# Patient Record
Sex: Male | Born: 1945 | Race: White | Hispanic: No | Marital: Married | Smoking: Current every day smoker
Health system: Southern US, Community
[De-identification: ages and names within clinical notes are randomized; demographics above are authoritative.]

## PROBLEM LIST (undated history)

## (undated) DIAGNOSIS — G20 Parkinson's disease: Secondary | ICD-10-CM

## (undated) DIAGNOSIS — F419 Anxiety disorder, unspecified: Secondary | ICD-10-CM

## (undated) DIAGNOSIS — E119 Type 2 diabetes mellitus without complications: Secondary | ICD-10-CM

## (undated) DIAGNOSIS — J189 Pneumonia, unspecified organism: Secondary | ICD-10-CM

## (undated) DIAGNOSIS — N189 Chronic kidney disease, unspecified: Secondary | ICD-10-CM

## (undated) DIAGNOSIS — I10 Essential (primary) hypertension: Secondary | ICD-10-CM

## (undated) DIAGNOSIS — M109 Gout, unspecified: Secondary | ICD-10-CM

## (undated) DIAGNOSIS — E785 Hyperlipidemia, unspecified: Secondary | ICD-10-CM

## (undated) DIAGNOSIS — G20C Parkinsonism, unspecified: Secondary | ICD-10-CM

## (undated) DIAGNOSIS — J449 Chronic obstructive pulmonary disease, unspecified: Secondary | ICD-10-CM

## (undated) HISTORY — DX: Essential (primary) hypertension: I10

## (undated) HISTORY — PX: KNEE ARTHROSCOPY: SUR90

## (undated) HISTORY — DX: Parkinsonism, unspecified: G20.C

## (undated) HISTORY — DX: Hyperlipidemia, unspecified: E78.5

## (undated) HISTORY — PX: ELBOW ARTHROSCOPY: SUR87

## (undated) HISTORY — PX: APPENDECTOMY: SHX54

## (undated) HISTORY — DX: Gout, unspecified: M10.9

## (undated) HISTORY — DX: Anxiety disorder, unspecified: F41.9

## (undated) HISTORY — DX: Chronic kidney disease, unspecified: N18.9

## (undated) HISTORY — DX: Parkinson's disease: G20

## (undated) HISTORY — DX: Chronic obstructive pulmonary disease, unspecified: J44.9

---

## 1997-10-02 ENCOUNTER — Ambulatory Visit (HOSPITAL_BASED_OUTPATIENT_CLINIC_OR_DEPARTMENT_OTHER): Admission: RE | Admit: 1997-10-02 | Discharge: 1997-10-02 | Payer: Self-pay | Admitting: Orthopedic Surgery

## 2006-06-08 ENCOUNTER — Encounter (INDEPENDENT_AMBULATORY_CARE_PROVIDER_SITE_OTHER): Payer: Self-pay | Admitting: Specialist

## 2006-06-08 ENCOUNTER — Ambulatory Visit (HOSPITAL_COMMUNITY): Admission: RE | Admit: 2006-06-08 | Discharge: 2006-06-09 | Payer: Self-pay | Admitting: General Surgery

## 2007-08-10 ENCOUNTER — Ambulatory Visit (HOSPITAL_COMMUNITY): Admission: RE | Admit: 2007-08-10 | Discharge: 2007-08-11 | Payer: Self-pay | Admitting: Neurosurgery

## 2010-08-20 NOTE — Op Note (Signed)
NAME:  Mitchell Hunter, Mitchell Hunter NO.:  [id number]   MEDICAL RECORD NO.:  [id number]          PATIENT TYPE:  AMB   LOCATION:  SDS                          FACILITY:  [facility]   PHYSICIAN:  Otilio Connors, M.D.  DATE OF BIRTH:  [birth year]   DATE OF PROCEDURE:  08/10/2007  DATE OF DISCHARGE:                               OPERATIVE REPORT   PREOPERATIVE DIAGNOSIS:  Herniated nucleus pulposus, left L4-5.   POSTOPERATIVE DIAGNOSIS:  Herniated nucleus pulposus, left L4-5.   PROCEDURES:  1. Left L4-5 semi-hemilaminectomy and discectomy.  2. Microdissection with microscope.   SURGEON:  Hazle Coca, M.D.   ANESTHESIA:  General endotracheal tube anesthesia.   BLOOD LOSS:  Minimal.   BLOOD GIVEN:  None.   DRAINS:  None.   COMPLICATIONS:  None.   REASON FOR PROCEDURE:  The patient is a 65 year old gentleman with back  and left leg pain, numbness, and some weakness in the left EHL.  MRI was  done showing disk herniation extruded down to the L5 root on the left  side.  The patient was brought in for decompression.   PROCEDURE IN DETAIL:  The patient was brought to the operating room and  general anesthesia was induced.  The patient was placed in a prone  position on Wilson frame with all pressure points padded.  The patient  was prepped and draped in a sterile fashion.  Stab incision was injected  with 10 mL of 1% lidocaine with epinephrine.  The needle was placed in  interspace.  X-rays were obtained showing the needle pointing at the L4-  5 interspace.  An incision was then made centered where the needle was.  The incision was taken down to the fascia and hemostasis was obtained  with Bovie cauterization.  The fascia was incised to the left side of  the spinous process and subperiosteal dissection was done over the  spinous process of the lamina out to the facet.  Self-retaining  retractor was placed and a marker was placed in the interspace and an x-  ray was obtained  confirming our positioning at L4-5.  At this point,  semi-hemilaminectomy was started with high-speed drill.  Microscope was  brought in for microdissection.  At this point, Kerrison punches were  used to complete the semi-hemilaminectomy, medial facetectomy.  A  foraminotomy was done over the L5 root.  Exploring the epidural space,  found a subligamentous disk herniation inferior to the disk space up  under the L5 root.  __________  disk fragments and removed it using  hooks and pituitary rongeurs.  After that, we were able to place a  __________ retractor and __________  medially.  We explored the disk  space.  There was a broad-based disk bulge but no significant nerve root  compression and no tear was easily noticed.  Again, I explored the  __________ nerve root and  even going into the axilla from the nerve  root and could not find any further fragments.  Nerve roots were  decompressed.  The above was also well decompressed.  Hemostasis was  obtained with  Gelfoam and thrombin.  This was irrigated with antibiotic  solution.  I placed some Depo-Medrol in the epidural space, removed the  retractors, closed the fascia with 0 Vicryl interrupted sutures,  subcutaneous tissue closed with 2-0 Vicryl interrupted sutures, and the  skin was closed with benzoin and Steri-Strips.  A dressing was placed.  The patient was placed back in the supine position, awoken from  anesthesia, and transferred to recovery room in stable condition.           ______________________________  Otilio Connors, M.D.     JRH/MEDQ  D:  08/10/2007  T:  08/11/2007  Job:  [id number]

## 2010-08-23 NOTE — Op Note (Signed)
NAME:  Mitchell Hunter, Mitchell Hunter NO.:  [id number]   MEDICAL RECORD NO.:  [id number]          PATIENT TYPE:  AMB   LOCATION:  DAY                          FACILITY:  [facility]   PHYSICIAN:  Odis Hollingshead, M.D.DATE OF BIRTH:  05-13-1945   DATE OF PROCEDURE:  06/08/2006  DATE OF DISCHARGE:                               OPERATIVE REPORT   PREOPERATIVE DIAGNOSIS:  Chronic appendicitis.   POSTOPERATIVE DIAGNOSIS:  Chronic appendicitis.   PROCEDURE:  Laparoscopic appendectomy with lysis of adhesions.   ANESTHESIA:  General.   INDICATIONS:  This is a 7-year male who has been known by me.  He had a  right lower quadrant large inflammatory process in the past that was  treated with adequate antibiotics.  It was felt that this was an  appendicitis.  We had discussed doing an interval appendectomy or  potentially just observing him.  He was observed and then returned early  this year with recurrent symptoms of chronic appendicitis which was  verified by CT scan and now presents for laparoscopic appendectomy.  We  have discussed the procedure and the risks preoperatively.   TECHNIQUE:  He was seen in the holding area and brought to the operating  room, placed supine on the operating table, and a general anesthetic was  administered.  A Foley catheter was placed in the bladder.  The hair on  the abdominal wall was clipped and the area sterilely prepped and  draped.  Dilute Marcaine solution was infiltrated in the subumbilical  region.  A small subumbilical incision was made through the skin,  subcutaneous tissue, fascia and peritoneum, entering the peritoneal  cavity under direct vision.  A pursestring suture of 0-Vicryl was placed  around the fascial edges.  A Hassan trocar was introduced into the  peritoneal cavity and pneumoperitoneum was created by insufflation of  CO2 gas.   Next a laparoscope was introduced.  There were adhesions between the  small intestine and anterior  abdominal wall in the right lower quadrant  region.  A 5 mm trocar was placed in the left lower quadrant and using  cold scissors, these adhesions were lysed, dropping the small bowel away  from the abdominal wall.  I then was able to visualize the appendix and  it tracked toward the right pelvis.  Using careful blunt dissection and  hydrodissection, I freed the appendix up from the pelvic sidewall.  I  then placed a 5 mm trocar in the right upper quadrant.  The mesoappendix  was grasped and the appendix retracted upward.  I then divided the  mesoappendix toward the base of the appendix using the harmonic scalpel.   At this time, an Endo-GIA stapler was introduced into the peritoneal  cavity and the appendix was amputated off the cecum.  It was obviously  chronically inflamed.  It was placed in an Endopouch bag and removed  through the subumbilical port.   The subumbilical trocar was replaced.  I copiously irrigated out the  right lower quadrant of the abdominal cavity and evacuated fluid.  There  was very minimal bleeding from the  staple line, and I placed Surgicel  and applied pressure and the bleeding stopped.  I then evacuated more of  the fluid and inspected the area once again.  It was hemostatic.  No  evidence of leak.   The subumbilical port was removed and a subumbilical fascial defect was  then closed under laparoscopic vision by tightening up and tying down  the pursestring suture.  The remaining trocars were removed, and the  pneumoperitoneum was released.  The skin incisions were closed with 4-0  Monocryl subcuticular stitches.  Steri-Strips and sterile dressings were  applied.   He tolerated the procedure without any apparent complications and was  taken to the recovery room in satisfactory condition.      Odis Hollingshead, M.D.  Electronically Signed     TJR/MEDQ  D:  06/08/2006  T:  06/08/2006  Job:  [id number]   cc:   Carolann Littler, M.D.  Fax: [phone]

## 2013-04-21 ENCOUNTER — Other Ambulatory Visit: Payer: Self-pay | Admitting: Family Medicine

## 2013-04-21 DIAGNOSIS — R531 Weakness: Secondary | ICD-10-CM

## 2013-04-21 DIAGNOSIS — M545 Low back pain, unspecified: Secondary | ICD-10-CM

## 2013-04-23 ENCOUNTER — Ambulatory Visit
Admission: RE | Admit: 2013-04-23 | Discharge: 2013-04-23 | Disposition: A | Discharge status: Home / Self Care | Payer: Medicare Other | Source: Ambulatory Visit | Attending: Family Medicine | Admitting: Family Medicine

## 2013-04-23 DIAGNOSIS — M545 Low back pain, unspecified: Secondary | ICD-10-CM

## 2013-04-23 DIAGNOSIS — R531 Weakness: Secondary | ICD-10-CM

## 2013-05-04 ENCOUNTER — Encounter (HOSPITAL_COMMUNITY): Payer: Self-pay | Admitting: Emergency Medicine

## 2013-05-04 ENCOUNTER — Encounter (HOSPITAL_COMMUNITY)
Admission: EM | Disposition: A | Discharge status: Home / Self Care | Payer: Self-pay | Source: Home / Self Care | Attending: Neurosurgery

## 2013-05-04 ENCOUNTER — Inpatient Hospital Stay: Admit: 2013-05-04 | Payer: Self-pay | Admitting: Neurosurgery

## 2013-05-04 ENCOUNTER — Inpatient Hospital Stay (HOSPITAL_COMMUNITY): Payer: Medicare Other | Admitting: Anesthesiology

## 2013-05-04 ENCOUNTER — Inpatient Hospital Stay (HOSPITAL_COMMUNITY): Payer: Medicare Other

## 2013-05-04 ENCOUNTER — Inpatient Hospital Stay (HOSPITAL_COMMUNITY)
Admission: EM | Admit: 2013-05-04 | Discharge: 2013-05-05 | DRG: 520 | Disposition: A | Discharge status: Home / Self Care | Payer: Medicare Other | Attending: Neurosurgery | Admitting: Neurosurgery

## 2013-05-04 ENCOUNTER — Encounter (HOSPITAL_COMMUNITY): Payer: Medicare Other | Admitting: Anesthesiology

## 2013-05-04 DIAGNOSIS — IMO0002 Reserved for concepts with insufficient information to code with codable children: Secondary | ICD-10-CM | POA: Diagnosis present

## 2013-05-04 DIAGNOSIS — M51379 Other intervertebral disc degeneration, lumbosacral region without mention of lumbar back pain or lower extremity pain: Secondary | ICD-10-CM | POA: Diagnosis present

## 2013-05-04 DIAGNOSIS — M5126 Other intervertebral disc displacement, lumbar region: Principal | ICD-10-CM | POA: Diagnosis present

## 2013-05-04 DIAGNOSIS — M47817 Spondylosis without myelopathy or radiculopathy, lumbosacral region: Secondary | ICD-10-CM | POA: Diagnosis present

## 2013-05-04 DIAGNOSIS — Z7982 Long term (current) use of aspirin: Secondary | ICD-10-CM

## 2013-05-04 DIAGNOSIS — I10 Essential (primary) hypertension: Secondary | ICD-10-CM | POA: Diagnosis present

## 2013-05-04 DIAGNOSIS — F17200 Nicotine dependence, unspecified, uncomplicated: Secondary | ICD-10-CM | POA: Diagnosis present

## 2013-05-04 DIAGNOSIS — E119 Type 2 diabetes mellitus without complications: Secondary | ICD-10-CM | POA: Diagnosis present

## 2013-05-04 DIAGNOSIS — M5137 Other intervertebral disc degeneration, lumbosacral region: Secondary | ICD-10-CM | POA: Diagnosis present

## 2013-05-04 DIAGNOSIS — Z79899 Other long term (current) drug therapy: Secondary | ICD-10-CM

## 2013-05-04 HISTORY — PX: LUMBAR LAMINECTOMY/DECOMPRESSION MICRODISCECTOMY: SHX5026

## 2013-05-04 HISTORY — DX: Type 2 diabetes mellitus without complications: E11.9

## 2013-05-04 LAB — GLUCOSE, CAPILLARY
Glucose-Capillary: 137 mg/dL — ABNORMAL HIGH (ref 70–99)
Glucose-Capillary: 181 mg/dL — ABNORMAL HIGH (ref 70–99)

## 2013-05-04 SURGERY — LUMBAR LAMINECTOMY/DECOMPRESSION MICRODISCECTOMY 1 LEVEL
Anesthesia: General | Site: Back

## 2013-05-04 MED ORDER — BENAZEPRIL HCL 20 MG PO TABS
20.0000 mg | ORAL_TABLET | Freq: Every day | ORAL | Status: DC
  Administered 2013-05-04 – 2013-05-05 (×2): 20 mg via ORAL
  Filled 2013-05-04 (×2): qty 1

## 2013-05-04 MED ORDER — MIDAZOLAM HCL 2 MG/2ML IJ SOLN
INTRAMUSCULAR | Status: AC
  Filled 2013-05-04: qty 2

## 2013-05-04 MED ORDER — HYDROMORPHONE HCL PF 1 MG/ML IJ SOLN
INTRAMUSCULAR | Status: AC
  Administered 2013-05-04: 0.5 mg via INTRAVENOUS
  Filled 2013-05-04: qty 1

## 2013-05-04 MED ORDER — HYDROCHLOROTHIAZIDE 12.5 MG PO CAPS
12.5000 mg | ORAL_CAPSULE | Freq: Every day | ORAL | Status: DC
  Administered 2013-05-04 – 2013-05-05 (×2): 12.5 mg via ORAL
  Filled 2013-05-04 (×2): qty 1

## 2013-05-04 MED ORDER — BISACODYL 10 MG RE SUPP: 10.0000 mg | Freq: Every day | RECTAL | Status: DC | PRN

## 2013-05-04 MED ORDER — ACETAMINOPHEN 325 MG PO TABS: 650.0000 mg | ORAL_TABLET | ORAL | Status: DC | PRN

## 2013-05-04 MED ORDER — INSULIN ASPART 100 UNIT/ML ~~LOC~~ SOLN: 0.0000 [IU] | Freq: Every day | SUBCUTANEOUS | Status: DC

## 2013-05-04 MED ORDER — METFORMIN HCL 500 MG PO TABS
1000.0000 mg | ORAL_TABLET | Freq: Two times a day (BID) | ORAL | Status: DC
  Administered 2013-05-04 – 2013-05-05 (×2): 1000 mg via ORAL
  Filled 2013-05-04 (×3): qty 2

## 2013-05-04 MED ORDER — DIAZEPAM 5 MG/ML IJ SOLN
2.5000 mg | Freq: Once | INTRAMUSCULAR | Status: AC
  Administered 2013-05-04: 2.5 mg via INTRAVENOUS

## 2013-05-04 MED ORDER — 0.9 % SODIUM CHLORIDE (POUR BTL) OPTIME
TOPICAL | Status: DC | PRN
  Administered 2013-05-04: 1000 mL

## 2013-05-04 MED ORDER — OXYCODONE HCL 5 MG/5ML PO SOLN: 5.0000 mg | Freq: Once | ORAL | Status: AC | PRN

## 2013-05-04 MED ORDER — CYCLOBENZAPRINE HCL 10 MG PO TABS: 10.0000 mg | ORAL_TABLET | Freq: Three times a day (TID) | ORAL | Status: DC | PRN

## 2013-05-04 MED ORDER — SODIUM CHLORIDE 0.9 % IJ SOLN: 3.0000 mL | INTRAMUSCULAR | Status: DC | PRN

## 2013-05-04 MED ORDER — INSULIN ASPART 100 UNIT/ML ~~LOC~~ SOLN
0.0000 [IU] | Freq: Three times a day (TID) | SUBCUTANEOUS | Status: DC
  Administered 2013-05-04: 2 [IU] via SUBCUTANEOUS

## 2013-05-04 MED ORDER — ONDANSETRON HCL 4 MG/2ML IJ SOLN
INTRAMUSCULAR | Status: AC
  Filled 2013-05-04: qty 2

## 2013-05-04 MED ORDER — HEMOSTATIC AGENTS (NO CHARGE) OPTIME
TOPICAL | Status: DC | PRN
  Administered 2013-05-04: 1 via TOPICAL

## 2013-05-04 MED ORDER — ALLOPURINOL 100 MG PO TABS
100.0000 mg | ORAL_TABLET | Freq: Two times a day (BID) | ORAL | Status: DC
  Administered 2013-05-04 – 2013-05-05 (×2): 100 mg via ORAL
  Filled 2013-05-04 (×3): qty 1

## 2013-05-04 MED ORDER — FENTANYL CITRATE 0.05 MG/ML IJ SOLN
INTRAMUSCULAR | Status: DC | PRN
  Administered 2013-05-04: 100 ug via INTRAVENOUS

## 2013-05-04 MED ORDER — CEFAZOLIN SODIUM-DEXTROSE 2-3 GM-% IV SOLR
INTRAVENOUS | Status: AC
  Administered 2013-05-04: 2 g via INTRAVENOUS
  Filled 2013-05-04: qty 50

## 2013-05-04 MED ORDER — ATENOLOL 100 MG PO TABS
100.0000 mg | ORAL_TABLET | Freq: Every day | ORAL | Status: DC
  Administered 2013-05-05: 100 mg via ORAL
  Filled 2013-05-04: qty 1

## 2013-05-04 MED ORDER — SODIUM CHLORIDE 0.9 % IJ SOLN
3.0000 mL | Freq: Two times a day (BID) | INTRAMUSCULAR | Status: DC
  Administered 2013-05-04 – 2013-05-05 (×2): 3 mL via INTRAVENOUS

## 2013-05-04 MED ORDER — ALUM & MAG HYDROXIDE-SIMETH 200-200-20 MG/5ML PO SUSP: 30.0000 mL | Freq: Four times a day (QID) | ORAL | Status: DC | PRN

## 2013-05-04 MED ORDER — KETOROLAC TROMETHAMINE 30 MG/ML IJ SOLN
30.0000 mg | Freq: Four times a day (QID) | INTRAMUSCULAR | Status: DC
Start: 2013-05-04 — End: 2013-05-05
  Administered 2013-05-04 – 2013-05-05 (×3): 30 mg via INTRAVENOUS
  Filled 2013-05-04 (×6): qty 1

## 2013-05-04 MED ORDER — MIDAZOLAM HCL 5 MG/5ML IJ SOLN
INTRAMUSCULAR | Status: DC | PRN
  Administered 2013-05-04: 2 mg via INTRAVENOUS

## 2013-05-04 MED ORDER — HYDROXYZINE HCL 25 MG PO TABS: 50.0000 mg | ORAL_TABLET | ORAL | Status: DC | PRN

## 2013-05-04 MED ORDER — THROMBIN 5000 UNITS EX SOLR
CUTANEOUS | Status: DC | PRN
  Administered 2013-05-04 (×2): 5000 [IU] via TOPICAL

## 2013-05-04 MED ORDER — PROMETHAZINE HCL 25 MG/ML IJ SOLN: 6.2500 mg | INTRAMUSCULAR | Status: DC | PRN

## 2013-05-04 MED ORDER — FENTANYL CITRATE 0.05 MG/ML IJ SOLN
INTRAMUSCULAR | Status: AC
Start: 2013-05-04 — End: 2013-05-05
  Filled 2013-05-04: qty 2

## 2013-05-04 MED ORDER — PROPOFOL 10 MG/ML IV BOLUS
INTRAVENOUS | Status: DC | PRN
  Administered 2013-05-04: 160 mg via INTRAVENOUS

## 2013-05-04 MED ORDER — BUPIVACAINE HCL (PF) 0.5 % IJ SOLN
INTRAMUSCULAR | Status: DC | PRN
  Administered 2013-05-04: 10 mL

## 2013-05-04 MED ORDER — MENTHOL 3 MG MT LOZG
1.0000 | LOZENGE | OROMUCOSAL | Status: DC | PRN
Start: 2013-05-04 — End: 2013-05-05

## 2013-05-04 MED ORDER — DIAZEPAM 5 MG/ML IJ SOLN
INTRAMUSCULAR | Status: AC
  Administered 2013-05-04: 2.5 mg via INTRAVENOUS
  Filled 2013-05-04: qty 2

## 2013-05-04 MED ORDER — OXYCODONE HCL 5 MG PO TABS
5.0000 mg | ORAL_TABLET | Freq: Once | ORAL | Status: AC | PRN
  Administered 2013-05-04: 5 mg via ORAL

## 2013-05-04 MED ORDER — PROPOFOL 10 MG/ML IV BOLUS
INTRAVENOUS | Status: AC
  Filled 2013-05-04: qty 20

## 2013-05-04 MED ORDER — OXYCODONE HCL 5 MG PO TABS
ORAL_TABLET | ORAL | Status: AC
  Administered 2013-05-04: 5 mg via ORAL
  Filled 2013-05-04: qty 2

## 2013-05-04 MED ORDER — ONDANSETRON HCL 4 MG/2ML IJ SOLN
INTRAMUSCULAR | Status: DC | PRN
  Administered 2013-05-04: 4 mg via INTRAVENOUS

## 2013-05-04 MED ORDER — FENTANYL CITRATE 0.05 MG/ML IJ SOLN
INTRAMUSCULAR | Status: AC
  Filled 2013-05-04: qty 5

## 2013-05-04 MED ORDER — SODIUM CHLORIDE 0.9 % IV SOLN: 250.0000 mL | INTRAVENOUS | Status: DC

## 2013-05-04 MED ORDER — EPHEDRINE SULFATE 50 MG/ML IJ SOLN
INTRAMUSCULAR | Status: AC
  Filled 2013-05-04: qty 1

## 2013-05-04 MED ORDER — OXYCODONE-ACETAMINOPHEN 5-325 MG PO TABS
1.0000 | ORAL_TABLET | ORAL | Status: DC | PRN
  Administered 2013-05-04: 2 via ORAL
  Filled 2013-05-04: qty 2

## 2013-05-04 MED ORDER — ACETAMINOPHEN 650 MG RE SUPP: 650.0000 mg | RECTAL | Status: DC | PRN

## 2013-05-04 MED ORDER — KETOROLAC TROMETHAMINE 30 MG/ML IJ SOLN
INTRAMUSCULAR | Status: AC
  Filled 2013-05-04: qty 1

## 2013-05-04 MED ORDER — NEOSTIGMINE METHYLSULFATE 1 MG/ML IJ SOLN
INTRAMUSCULAR | Status: AC
  Filled 2013-05-04: qty 10

## 2013-05-04 MED ORDER — BENAZEPRIL-HYDROCHLOROTHIAZIDE 20-12.5 MG PO TABS
1.0000 | ORAL_TABLET | Freq: Every day | ORAL | Status: DC
Start: 2013-05-04 — End: 2013-05-04

## 2013-05-04 MED ORDER — GABAPENTIN 600 MG PO TABS
600.0000 mg | ORAL_TABLET | Freq: Two times a day (BID) | ORAL | Status: DC
  Administered 2013-05-04 – 2013-05-05 (×2): 600 mg via ORAL
  Filled 2013-05-04 (×4): qty 1

## 2013-05-04 MED ORDER — GLYCOPYRROLATE 0.2 MG/ML IJ SOLN
INTRAMUSCULAR | Status: DC | PRN
  Administered 2013-05-04: 0.2 mg via INTRAVENOUS
  Administered 2013-05-04: 0.6 mg via INTRAVENOUS

## 2013-05-04 MED ORDER — LIDOCAINE HCL (CARDIAC) 20 MG/ML IV SOLN
INTRAVENOUS | Status: DC | PRN
  Administered 2013-05-04: 50 mg via INTRAVENOUS

## 2013-05-04 MED ORDER — HYDROMORPHONE HCL PF 1 MG/ML IJ SOLN
0.2500 mg | INTRAMUSCULAR | Status: DC | PRN
  Administered 2013-05-04 (×5): 0.5 mg via INTRAVENOUS

## 2013-05-04 MED ORDER — GLYCOPYRROLATE 0.2 MG/ML IJ SOLN
INTRAMUSCULAR | Status: AC
  Filled 2013-05-04: qty 1

## 2013-05-04 MED ORDER — SODIUM CHLORIDE 0.9 % IV SOLN
INTRAVENOUS | Status: DC
  Administered 2013-05-04: 22:00:00 via INTRAVENOUS

## 2013-05-04 MED ORDER — LACTATED RINGERS IV SOLN
INTRAVENOUS | Status: DC | PRN
  Administered 2013-05-04 (×2): via INTRAVENOUS

## 2013-05-04 MED ORDER — GLYCOPYRROLATE 0.2 MG/ML IJ SOLN
INTRAMUSCULAR | Status: AC
  Filled 2013-05-04: qty 3

## 2013-05-04 MED ORDER — HYDROCODONE-ACETAMINOPHEN 5-325 MG PO TABS: 1.0000 | ORAL_TABLET | ORAL | Status: DC | PRN

## 2013-05-04 MED ORDER — HYDROMORPHONE HCL PF 1 MG/ML IJ SOLN
INTRAMUSCULAR | Status: AC
  Filled 2013-05-04: qty 1

## 2013-05-04 MED ORDER — PHENOL 1.4 % MT LIQD: 1.0000 | OROMUCOSAL | Status: DC | PRN

## 2013-05-04 MED ORDER — KETOROLAC TROMETHAMINE 30 MG/ML IJ SOLN
30.0000 mg | Freq: Once | INTRAMUSCULAR | Status: AC
  Administered 2013-05-04: 30 mg via INTRAVENOUS

## 2013-05-04 MED ORDER — FENTANYL CITRATE 0.05 MG/ML IJ SOLN
INTRAMUSCULAR | Status: DC | PRN
  Administered 2013-05-04: 50 ug via INTRAVENOUS
  Administered 2013-05-04 (×3): 100 ug via INTRAVENOUS

## 2013-05-04 MED ORDER — MAGNESIUM HYDROXIDE 400 MG/5ML PO SUSP: 30.0000 mL | Freq: Every day | ORAL | Status: DC | PRN

## 2013-05-04 MED ORDER — ONDANSETRON HCL 4 MG/2ML IJ SOLN: 4.0000 mg | Freq: Four times a day (QID) | INTRAMUSCULAR | Status: DC | PRN

## 2013-05-04 MED ORDER — METHYLPREDNISOLONE ACETATE 80 MG/ML IJ SUSP
INTRAMUSCULAR | Status: DC | PRN
  Administered 2013-05-04: 80 mg

## 2013-05-04 MED ORDER — SODIUM CHLORIDE 0.9 % IR SOLN
Status: DC | PRN
  Administered 2013-05-04: 15:00:00

## 2013-05-04 MED ORDER — MORPHINE SULFATE 4 MG/ML IJ SOLN: 4.0000 mg | INTRAMUSCULAR | Status: DC | PRN

## 2013-05-04 MED ORDER — NEOSTIGMINE METHYLSULFATE 1 MG/ML IJ SOLN
INTRAMUSCULAR | Status: DC | PRN
  Administered 2013-05-04: 4 mg via INTRAVENOUS

## 2013-05-04 MED ORDER — SODIUM CHLORIDE 0.9 % IJ SOLN
INTRAMUSCULAR | Status: AC
  Filled 2013-05-04: qty 10

## 2013-05-04 MED ORDER — LIDOCAINE-EPINEPHRINE 1 %-1:100000 IJ SOLN
INTRAMUSCULAR | Status: DC | PRN
  Administered 2013-05-04: 10 mL

## 2013-05-04 MED ORDER — ROCURONIUM BROMIDE 100 MG/10ML IV SOLN
INTRAVENOUS | Status: DC | PRN
  Administered 2013-05-04: 50 mg via INTRAVENOUS

## 2013-05-04 SURGICAL SUPPLY — 67 items
ADH SKN CLS APL DERMABOND .7 (GAUZE/BANDAGES/DRESSINGS)
APL SKNCLS STERI-STRIP NONHPOA (GAUZE/BANDAGES/DRESSINGS)
BAG DECANTER FOR FLEXI CONT (MISCELLANEOUS) ×2 IMPLANT
BENZOIN TINCTURE PRP APPL 2/3 (GAUZE/BANDAGES/DRESSINGS) IMPLANT
BLADE SURG ROTATE 9660 (MISCELLANEOUS) ×1 IMPLANT
BRUSH SCRUB EZ PLAIN DRY (MISCELLANEOUS) ×2 IMPLANT
BUR ACORN 6.0 ACORN (BURR) IMPLANT
BUR ACRON 5.0MM COATED (BURR) ×1 IMPLANT
BUR MATCHSTICK NEURO 3.0 LAGG (BURR) ×2 IMPLANT
CANISTER SUCT 3000ML (MISCELLANEOUS) ×2 IMPLANT
CONT SPEC 4OZ CLIKSEAL STRL BL (MISCELLANEOUS) IMPLANT
DERMABOND ADVANCED (GAUZE/BANDAGES/DRESSINGS)
DERMABOND ADVANCED .7 DNX12 (GAUZE/BANDAGES/DRESSINGS) IMPLANT
DRAPE LAPAROTOMY 100X72X124 (DRAPES) ×2 IMPLANT
DRAPE MICROSCOPE LEICA (MISCELLANEOUS) ×2 IMPLANT
DRAPE POUCH INSTRU U-SHP 10X18 (DRAPES) ×2 IMPLANT
DRSG EMULSION OIL 3X3 NADH (GAUZE/BANDAGES/DRESSINGS) IMPLANT
ELECT REM PT RETURN 9FT ADLT (ELECTROSURGICAL) ×2
ELECTRODE REM PT RTRN 9FT ADLT (ELECTROSURGICAL) ×1 IMPLANT
GAUZE SPONGE 4X4 16PLY XRAY LF (GAUZE/BANDAGES/DRESSINGS) IMPLANT
GLOVE BIO SURGEON STRL SZ8.5 (GLOVE) ×1 IMPLANT
GLOVE BIOGEL PI IND STRL 7.5 (GLOVE) IMPLANT
GLOVE BIOGEL PI IND STRL 8 (GLOVE) ×1 IMPLANT
GLOVE BIOGEL PI INDICATOR 7.5 (GLOVE) ×4
GLOVE BIOGEL PI INDICATOR 8 (GLOVE) ×1
GLOVE ECLIPSE 7.5 STRL STRAW (GLOVE) ×4 IMPLANT
GLOVE EXAM NITRILE LRG STRL (GLOVE) IMPLANT
GLOVE EXAM NITRILE MD LF STRL (GLOVE) IMPLANT
GLOVE EXAM NITRILE XL STR (GLOVE) IMPLANT
GLOVE EXAM NITRILE XS STR PU (GLOVE) IMPLANT
GLOVE SS BIOGEL STRL SZ 8 (GLOVE) IMPLANT
GLOVE SUPERSENSE BIOGEL SZ 8 (GLOVE) ×1
GLOVE SURG SS PI 7.0 STRL IVOR (GLOVE) ×3 IMPLANT
GOWN BRE IMP SLV AUR LG STRL (GOWN DISPOSABLE) ×1 IMPLANT
GOWN BRE IMP SLV AUR XL STRL (GOWN DISPOSABLE) IMPLANT
GOWN STRL REIN 2XL LVL4 (GOWN DISPOSABLE) IMPLANT
GOWN STRL REUS W/ TWL LRG LVL3 (GOWN DISPOSABLE) IMPLANT
GOWN STRL REUS W/ TWL XL LVL3 (GOWN DISPOSABLE) IMPLANT
GOWN STRL REUS W/TWL LRG LVL3 (GOWN DISPOSABLE) ×4
GOWN STRL REUS W/TWL XL LVL3 (GOWN DISPOSABLE) ×6
KIT BASIN OR (CUSTOM PROCEDURE TRAY) ×2 IMPLANT
KIT ROOM TURNOVER OR (KITS) ×2 IMPLANT
NDL HYPO 18GX1.5 BLUNT FILL (NEEDLE) IMPLANT
NDL SPNL 18GX3.5 QUINCKE PK (NEEDLE) ×1 IMPLANT
NDL SPNL 22GX3.5 QUINCKE BK (NEEDLE) ×1 IMPLANT
NEEDLE HYPO 18GX1.5 BLUNT FILL (NEEDLE) ×2 IMPLANT
NEEDLE SPNL 18GX3.5 QUINCKE PK (NEEDLE) ×2 IMPLANT
NEEDLE SPNL 22GX3.5 QUINCKE BK (NEEDLE) ×2 IMPLANT
NS IRRIG 1000ML POUR BTL (IV SOLUTION) ×2 IMPLANT
PACK LAMINECTOMY NEURO (CUSTOM PROCEDURE TRAY) ×2 IMPLANT
PAD ARMBOARD 7.5X6 YLW CONV (MISCELLANEOUS) ×6 IMPLANT
PATTIES SURGICAL .5 X1 (DISPOSABLE) ×1 IMPLANT
RUBBERBAND STERILE (MISCELLANEOUS) ×4 IMPLANT
SPONGE GAUZE 4X4 12PLY (GAUZE/BANDAGES/DRESSINGS) IMPLANT
SPONGE LAP 4X18 X RAY DECT (DISPOSABLE) IMPLANT
SPONGE SURGIFOAM ABS GEL SZ50 (HEMOSTASIS) ×2 IMPLANT
STRIP CLOSURE SKIN 1/2X4 (GAUZE/BANDAGES/DRESSINGS) IMPLANT
SUT PROLENE 6 0 BV (SUTURE) IMPLANT
SUT VIC AB 1 CT1 18XBRD ANBCTR (SUTURE) ×1 IMPLANT
SUT VIC AB 1 CT1 8-18 (SUTURE) ×2
SUT VIC AB 2-0 CP2 18 (SUTURE) ×2 IMPLANT
SUT VIC AB 3-0 SH 8-18 (SUTURE) ×1 IMPLANT
SYR 20ML ECCENTRIC (SYRINGE) ×2 IMPLANT
SYR 5ML LL (SYRINGE) ×1 IMPLANT
TOWEL OR 17X24 6PK STRL BLUE (TOWEL DISPOSABLE) ×2 IMPLANT
TOWEL OR 17X26 10 PK STRL BLUE (TOWEL DISPOSABLE) ×2 IMPLANT
WATER STERILE IRR 1000ML POUR (IV SOLUTION) ×2 IMPLANT

## 2013-05-04 NOTE — ED Notes (Signed)
States is be admitted for back surgery today cannot get it for 14 days due to insurance cannot wait is in a lot of pain

## 2013-05-04 NOTE — Anesthesia Procedure Notes (Signed)
Procedure Name: Intubation Date/Time: 05/04/2013 2:22 PM Performed by: Maude Leriche D Pre-anesthesia Checklist: Patient identified, Emergency Drugs available, Suction available, Patient being monitored and Timeout performed Patient Re-evaluated:Patient Re-evaluated prior to inductionOxygen Delivery Method: Circle system utilized Preoxygenation: Pre-oxygenation with 100% oxygen Intubation Type: IV induction Ventilation: Mask ventilation without difficulty Laryngoscope Size: Miller and 2 Grade View: Grade I Tube type: Oral Tube size: 7.5 mm Number of attempts: 1 Airway Equipment and Method: Stylet Placement Confirmation: ETT inserted through vocal cords under direct vision,  positive ETCO2 and breath sounds checked- equal and bilateral Secured at: 22 cm Tube secured with: Tape Dental Injury: Teeth and Oropharynx as per pre-operative assessment

## 2013-05-04 NOTE — H&P (Addendum)
Subjective: Patient is a 68 y.o. male who is admitted for treatment of large right L3-4 lumbar disc herniation, with disabling right lumbar radiculopathy and significant weakness of the right iliopsoas. Patient has a history of a left L4-5 lumbar laminotomy and microdiscectomy in May of 2009. His current difficulties began to half weeks ago, initially with low back pain over several days pain extended down into the right lower extremity. It extends from the right side of the back into the lateral right buttock and hip, and into the anterolateral right thigh into the anterior right knee and leg. Patient fell at home, and feels that he has weakness in the right lower extremity.  The patient was treated with narcotic analgesics, prednisone, and Flexeril without improvement. MRI scan revealed a right L3-4 lumbar disc herniation with a fragment has migrated rostrally behind the body of L3 and laterally into the right L3-4 neural foramen, and to a lesser extent into the right L3-4 extra foraminal space.  Patient was evaluated in the office, and recommendation for surgery was made, however he felt that he could not wait until approval from his insurance carrier because of the disabling pain and significant weakness, and therefore presented to the [facility] emergency room for further evaluation.  Patient admitted now for a right L3-4 lumbar laminotomy discectomy, and possible right L3-4 extraforaminal microdiscectomy.   Patient Active Problem List   Diagnosis Date Noted  . HNP (herniated nucleus pulposus), lumbar 05/04/2013   Past Medical History  Diagnosis Date  . Hypertension   . Diabetes mellitus without complication     History reviewed. No pertinent past surgical history.   (Not in a hospital admission) No Known Allergies  History  Substance Use Topics  . Smoking status: Current Every Day Smoker  . Smokeless tobacco: Not on file  . Alcohol Use: Yes    Family History  Problem  Relation Age of Onset  . Lung disease Father      Review of Systems Pertinent items are noted in HPI.  Objective: Vital signs in last 24 hours: Temp:  [97.8 F (36.6 C)] 97.8 F (36.6 C) (01/28 1140) Pulse Rate:  [81] 81 (01/28 1140) Resp:  [20] 20 (01/28 1140) BP: (140)/(71) 140/71 mmHg (01/28 1140) SpO2:  [96 %] 96 % (01/28 1140) Weight:  [83.008 kg (183 lb)] 83.008 kg (183 lb) (01/28 1140)  EXAM: Patient is a well-developed well-nourished white male in discomfort, but no acute distress. He walks with a single-point cane favoring the right lower extremity due to pain and weakness. Lungs are clear to auscultation , the patient has symmetrical respiratory excursion, however his breath sounds are mildly distant. Heart has a regular rate and rhythm normal S1 and S2 no murmur.   Abdomen is soft nontender nondistended bowel sounds are present. Extremity examination shows mild clubbing, but no cyanosis or edema. Musculoskeletal examination shows no tenderness to palpation over the lumbar spinous process or paralumbar musculature. He is limited in flexion due to pain at 30-40, he's able to extend to 10. Straight leg raising is negative bilaterally Motor examination shows the right iliopsoas is 4 minus/5, the left iliopsoas is 5/5. The remainder the lower extremity strength is 5/5 including the quadriceps, dorsiflexor, EHL, and plantar flexor bilaterally. Sensation is intact to pinprick the distal lower extremities. Reflexes are one in the quadriceps, absent at the gastrocnemius, toes are downgoing bilaterally. His gait and stance both favor the right lower extremity, due to pain and weakness.  Assessment/Plan: Patient  with a disabling right lumbar radiculopathy, with weakness of the right iliopsoas, who had fallen at home.  MRI scan showed a large right L3-4 lumbar disc herniation. He is admitted for a right L3-4 lumbar laminotomy and microdiscectomy, and a possible right L3-4 extraforaminal  microdiscectomy.  I've discussed with the patient the nature of his condition, the nature the surgical procedure, the typical length of surgery, hospital stay, and overall recuperation. We discussed limitations postoperatively. I discussed risks of surgery including risks of infection, bleeding, possibly need for transfusion, the risk of nerve root dysfunction with pain, weakness, numbness, or paresthesias, or risk of dural tear and CSF leakage and possible need for further surgery, the risk of recurrent disc herniation and the possible need for further surgery, and the risk of anesthetic complications including myocardial infarction, stroke, pneumonia, and death. Understanding all this the patient does wish to proceed with surgery and is admitted for such.      Hosie Spangle, MD 05/04/2013 12:30 PM'

## 2013-05-04 NOTE — Progress Notes (Signed)
Pt arrived from OR on a stretcher.  Disoriented x4, turning side to side, not following commands but arousable.  In severe pain.

## 2013-05-04 NOTE — Anesthesia Preprocedure Evaluation (Addendum)
Anesthesia Evaluation  Patient identified by MRN, date of birth, ID band Patient awake    Reviewed: Allergy & Precautions, H&P , NPO status , Patient's Chart, lab work & pertinent test results  Airway Mallampati: I TM Distance: >3 FB Neck ROM: Full    Dental  (+) Caps and Dental Advisory Given   Pulmonary Current Smoker,  breath sounds clear to auscultation        Cardiovascular hypertension, Pt. on medications Rate:Normal     Neuro/Psych    GI/Hepatic   Endo/Other  diabetes, Oral Hypoglycemic Agents  Renal/GU      Musculoskeletal   Abdominal   Peds  Hematology   Anesthesia Other Findings   Reproductive/Obstetrics                         Anesthesia Physical Anesthesia Plan  ASA: III  Anesthesia Plan: General   Post-op Pain Management:    Induction: Intravenous  Airway Management Planned: Oral ETT  Additional Equipment:   Intra-op Plan:   Post-operative Plan: Extubation in OR  Informed Consent: I have reviewed the patients History and Physical, chart, labs and discussed the procedure including the risks, benefits and alternatives for the proposed anesthesia with the patient or authorized representative who has indicated his/her understanding and acceptance.   Dental advisory given  Plan Discussed with: CRNA, Surgeon and Anesthesiologist  Anesthesia Plan Comments:        Anesthesia Quick Evaluation

## 2013-05-04 NOTE — ED Provider Notes (Signed)
CSN: [id number]     Arrival date & time 05/04/13  1136 History   First MD Initiated Contact with Patient 05/04/13 1206     Chief Complaint  Patient presents with  . Back Pain   (Consider location/radiation/quality/duration/timing/severity/associated sxs/prior Treatment) HPI Mitchell Hunter is a 68 y.o. male who presents to ED with complaint of back pain. States hx of back pain and prior surgery. States pain worsened in the last 3 weeks with increased pain and weakness in right leg. State that he went to see his neurosurgeon, Dr. Rita Ohara, who recommended emergent surgery to prevent further neuro deficits. Pt was sent here for further treatment. He has been taking percocet at home for pain with no improvement. No recent injuries. No loss of bowels or bladder control.    Past Medical History  Diagnosis Date  . Hypertension   . Diabetes mellitus without complication    History reviewed. No pertinent past surgical history. Family History  Problem Relation Age of Onset  . Lung disease Father    History  Substance Use Topics  . Smoking status: Current Every Day Smoker  . Smokeless tobacco: Not on file  . Alcohol Use: Yes    Review of Systems  Constitutional: Negative for fever and chills.  Respiratory: Negative for cough, chest tightness and shortness of breath.   Cardiovascular: Negative for chest pain, palpitations and leg swelling.  Gastrointestinal: Negative for nausea, vomiting, abdominal pain, diarrhea and abdominal distention.  Genitourinary: Negative for dysuria, urgency, frequency, hematuria and flank pain.  Musculoskeletal: Positive for back pain. Negative for arthralgias, myalgias, neck pain and neck stiffness.  Skin: Negative for rash.  Allergic/Immunologic: Negative for immunocompromised state.  Neurological: Positive for weakness and numbness. Negative for dizziness, light-headedness and headaches.  All other systems reviewed and are negative.    Allergies  Review  of patient's allergies indicates no known allergies.  Home Medications   Current Outpatient Rx  Name  Route  Sig  Dispense  Refill  . allopurinol (ZYLOPRIM) 100 MG tablet   Oral   Take 100 mg by mouth 2 (two) times daily.         Marland Kitchen aspirin EC 81 MG tablet   Oral   Take 81 mg by mouth daily.         Marland Kitchen atenolol (TENORMIN) 100 MG tablet   Oral   Take 100 mg by mouth daily.         . benazepril-hydrochlorthiazide (LOTENSIN HCT) 20-12.5 MG per tablet   Oral   Take 1 tablet by mouth daily.         . Cetirizine HCl (ZYRTEC PO)   Oral   Take 10 mg by mouth daily.          . cyclobenzaprine (FLEXERIL) 5 MG tablet   Oral   Take 5 mg by mouth 3 (three) times daily as needed. For muscle spasms         . gabapentin (NEURONTIN) 600 MG tablet   Oral   Take 600 mg by mouth as directed.         . metFORMIN (GLUCOPHAGE) 1000 MG tablet   Oral   Take 1,000 mg by mouth 2 (two) times daily with a meal.         . Multiple Vitamins-Minerals (PRESERVISION AREDS 2 PO)   Oral   Take 1 capsule by mouth 2 (two) times daily.         . Omega-3 Fatty Acids (FISH OIL PO)  Oral   Take 1 capsule by mouth daily.         Marland Kitchen oxyCODONE-acetaminophen (PERCOCET/ROXICET) 5-325 MG per tablet   Oral   Take 1-2 tablets by mouth every 6 (six) hours as needed. For pain          BP 140/71  Pulse 81  Temp(Src) 97.8 F (36.6 C) (Oral)  Resp 20  Ht 6' (1.829 m)  Wt 183 lb (83.008 kg)  BMI 24.81 kg/m2  SpO2 96% Physical Exam  Nursing note and vitals reviewed. Constitutional: He appears well-developed and well-nourished. No distress.  HENT:  Head: Normocephalic.  Neck: Normal range of motion. Neck supple.  Cardiovascular: Normal rate, regular rhythm and normal heart sounds.   Pulmonary/Chest: Effort normal and breath sounds normal. No respiratory distress. He has no wheezes. He has no rales.  Abdominal: Soft. There is no tenderness.  Musculoskeletal:  No tenderness over lumbar  spinous processes. Pain with forward flexion. Limited ROM due to pain. No pain with straight let raise bilaterally.   Neurological: He is alert.  2+ and equal patellar reflexes bilaterally. Pt able to dorsiflex bilateral toes and feet with good strength against resistance. Mild weakness with knee extension and plantar flexion of the foot.  Equal sensation bilaterally over thighs and lower legs.   Skin: Skin is warm and dry.    ED Course  Procedures (including critical care time) Labs Review Labs Reviewed - No data to display Imaging Review No results found.  EKG Interpretation   None       MDM   1. Herniated lumbar intervertebral disc     Pt seen and examined. Dr. Rita Ohara here to take pt to surgery. Pt is aware.   Filed Vitals:   05/04/13 1140  BP: 140/71  Pulse: 81  Temp: 97.8 F (36.6 C)  Resp: 20       Mitchell Radel A Jessamyn Watterson, PA-C 05/04/13 1239

## 2013-05-04 NOTE — Transfer of Care (Signed)
Immediate Anesthesia Transfer of Care Note  Patient: Mitchell Hunter  Procedure(s) Performed: Procedure(s) with comments: Right Lumbar Three-Four laminectomy and microdiskectomy  (N/A) - Right Lumbar Three-Four laminectomy and microdiskectomy   Patient Location: PACU  Anesthesia Type:General  Level of Consciousness: sedated  Airway & Oxygen Therapy: Patient Spontanous Breathing and Patient connected to face mask oxygen  Post-op Assessment: Report given to PACU RN and Post -op Vital signs reviewed and stable  Post vital signs: Reviewed and stable  Complications: No apparent anesthesia complications

## 2013-05-04 NOTE — Progress Notes (Signed)
Filed Vitals:   05/04/13 1715 05/04/13 1723 05/04/13 1830 05/04/13 2002  BP: 134/71 148/75 132/81 130/80  Pulse: 85 76 84 71  Temp:  98.3 F (36.8 C) 98 F (36.7 C) 98.3 F (36.8 C)  TempSrc:    Oral  Resp: 17 17 18 18   Height:      Weight:      SpO2: 98% 97% 93% 93%    Patient resting comfortably in bed. Minimal incisional discomfort, good relief of right lumbar radiculopathy. Patient has not yet ambulated. Patient has not yet voided. Dressing applied by nursing staff because of a small amount of oozing at the bottom of the incision. No apparent further oozing.  Plan: Spoke with the nursing staff about monitoring voiding function. Encourage patient and nursing staff to have the patient ambulate in the halls this evening. We'll continue to progress through postoperative recovery.  Hosie Spangle, MD 05/04/2013, 8:13 PM

## 2013-05-04 NOTE — Anesthesia Postprocedure Evaluation (Signed)
  Anesthesia Post-op Note  Patient: Mitchell Hunter  Procedure(s) Performed: Procedure(s) with comments: Right Lumbar Three-Four laminectomy and microdiskectomy  (N/A) - Right Lumbar Three-Four laminectomy and microdiskectomy   Patient Location: PACU  Anesthesia Type:General  Level of Consciousness: awake, oriented, sedated and patient cooperative  Airway and Oxygen Therapy: Patient Spontanous Breathing  Post-op Pain: mild  Post-op Assessment: Post-op Vital signs reviewed, Patient's Cardiovascular Status Stable, Respiratory Function Stable, Patent Airway, No signs of Nausea or vomiting and Pain level controlled  Post-op Vital Signs: stable  Complications: No apparent anesthesia complications

## 2013-05-04 NOTE — Op Note (Signed)
05/04/2013  3:34 PM  PATIENT:  Mitchell Hunter  68 y.o. male  PRE-OPERATIVE DIAGNOSIS:  Right L3-4 Lumbar Herniated Nucleus Pulposus, lumbar spondylosis, lumbar degenerative disc disease, lumbar radiculopathy  POST-OPERATIVE DIAGNOSIS:  Right L3-4 Lumbar Herniated Nucleus Pulposus, lumbar spondylosis, lumbar degenerative disc disease, lumbar radiculopathy  PROCEDURE:  Procedure(s): Right Lumbar Three-Four laminotomy and microdiskectomy with microdissection, microsurgical technique, and the operating microscope  SURGEON:  Surgeon(s): Hosie Spangle, MD  ASSISTANTS: Newman Pies, M.D.  ANESTHESIA:   general  EBL:  Total I/O In: 1000 [I.V.:1000] Out: 50 [Blood:50]  BLOOD ADMINISTERED:none  COUNT: Correct per nursing staff  DICTATION: Patient was brought to the operating room and placed under general endotracheal anesthesia. Patient was turned to prone position the lumbar region was prepped with Betadine soap and solution and draped in a sterile fashion. The midline was infiltrated with local anesthetic with epinephrine. A localizing x-ray was taken and the L3-4 level was identified. Midline incision was made over the L3-4 level and was carried down through the subcutaneous tissue to the lumbar fascia. The lumbar fascia was incised on the right side and the paraspinal muscles were dissected from the spinous processes and lamina in a subperiosteal fashion. Another x-ray was taken and the L3-4 intralaminar space was identified. The operating microscope was draped and brought into the field provided additional magnification, illumination, and visualization. Laminotomy was performed using the high-speed drill and Kerrison punches. The ligamentum flavum was carefully resected. The underlying thecal sac and nerve root were identified. The disc herniation was identified and the thecal sac and nerve root gently retracted medially. There was a large free fragment extending rostrally behind the  body of L3, and extending up to the right L3 nerve root, and laterally into the right L3-4 neural foramen. Several large free fragments were removed, and then additional smaller fragments were removed. We identified the exiting right L3 nerve root and confirmed it was decompressed. We then carefully explored the epidural space and neural foramen, and removed all additional loose fragments of disc material. It was felt there is no advantage to enter the disc space, and that we achieved good decompression of the neural structures. Once the discectomy was completed and good decompression of the thecal sac and nerve had been achieved hemostasis was established with the use of bipolar cautery and Gelfoam with thrombin. The Gelfoam was removed and hemostasis confirmed. We then instilled 2 cc of fentanyl and 80 mg of Depo-Medrol into the epidural space. Deep fascia was closed with interrupted undyed 1 Vicryl sutures. Scarpa's fascia was closed with interrupted undyed 1 Vicryl sutures in the subcutaneous and subcuticular layer were closed with interrupted inverted 2-0 undyed Vicryl sutures. The skin edges were approximated with Dermabond. Following surgery the patient was turned back to a supine position to be reversed from the anesthetic extubated and transferred to the recovery room for further care.   PLAN OF CARE: Admit to inpatient   PATIENT DISPOSITION:  PACU - hemodynamically stable.   Delay start of Pharmacological VTE agent (>24hrs) due to surgical blood loss or risk of bleeding:  yes

## 2013-05-04 NOTE — OR Nursing (Signed)
Right Hand PIV was accidentally removed when patient transferred from the stretcher to the bed.  I attempted two new starts but both attempts infiltrated.  Caryl Pina RN was given report and she will assess pt for IV start.

## 2013-05-05 ENCOUNTER — Encounter (HOSPITAL_COMMUNITY): Payer: Self-pay | Admitting: Neurosurgery

## 2013-05-05 LAB — POCT I-STAT 4, (NA,K, GLUC, HGB,HCT)
Glucose, Bld: 141 mg/dL — ABNORMAL HIGH (ref 70–99)
HCT: 43 % (ref 39.0–52.0)
Hemoglobin: 14.6 g/dL (ref 13.0–17.0)
Potassium: 3.9 mEq/L (ref 3.7–5.3)
Sodium: 138 mEq/L (ref 137–147)

## 2013-05-05 LAB — GLUCOSE, CAPILLARY
Glucose-Capillary: 172 mg/dL — ABNORMAL HIGH (ref 70–99)
Glucose-Capillary: 220 mg/dL — ABNORMAL HIGH (ref 70–99)

## 2013-05-05 MED ORDER — OXYCODONE-ACETAMINOPHEN 5-325 MG PO TABS: 1.0000 | ORAL_TABLET | ORAL | Status: DC | PRN

## 2013-05-05 NOTE — Plan of Care (Signed)
Problem: Consults Goal: Diagnosis - Spinal Surgery Outcome: Completed/Met Date Met:  05/05/13 Lumbar Laminectomy (Complex)

## 2013-05-05 NOTE — Discharge Summary (Signed)
Physician Discharge Summary  Patient ID: Mitchell Hunter MRN: [id number] DOB/AGE: [birth year] 68 y.o.  Admit date: 05/04/2013 Discharge date: 05/05/2013  Admission Diagnoses:  Right L3-4 Lumbar Herniated Nucleus Pulposus, lumbar spondylosis, lumbar degenerative disc disease, lumbar radiculopathy  Discharge Diagnoses:  Right L3-4 Lumbar Herniated Nucleus Pulposus, lumbar spondylosis, lumbar degenerative disc disease, lumbar radiculopathy  Active Problems:   HNP (herniated nucleus pulposus), lumbar  Discharged Condition: good  Hospital Course: Patient was admitted, underwent a right L3-4 lumbar laminotomy and microdiscectomy. Postoperatively he has had good relief of his radicular pain, and feels that the right lower extremity is stronger. He does have some discomfort around the right knee but he feels it may have developed from when he fell and landed on the right knee. His wound is clean and dry, he is up and ambulate actively. He is voiding well. He is being discharged home with instructions regarding wound care and activities. He is to return for followup with me in 3 weeks.  Discharge Exam: Blood pressure 160/80, pulse 61, temperature 99 F (37.2 C), temperature source Oral, resp. rate 20, height 6' (1.829 m), weight 83.008 kg (183 lb), SpO2 94.00%.  Disposition: Home     Medication List         allopurinol 100 MG tablet  Commonly known as:  ZYLOPRIM  Take 100 mg by mouth 2 (two) times daily.     aspirin EC 81 MG tablet  Take 81 mg by mouth daily.     atenolol 100 MG tablet  Commonly known as:  TENORMIN  Take 100 mg by mouth daily.     benazepril-hydrochlorthiazide 20-12.5 MG per tablet  Commonly known as:  LOTENSIN HCT  Take 1 tablet by mouth daily.     cyclobenzaprine 5 MG tablet  Commonly known as:  FLEXERIL  Take 5 mg by mouth 3 (three) times daily as needed. For muscle spasms     FISH OIL PO  Take 1 capsule by mouth daily.     gabapentin 600 MG tablet  Commonly  known as:  NEURONTIN  Take 600 mg by mouth as directed.     metFORMIN 1000 MG tablet  Commonly known as:  GLUCOPHAGE  Take 1,000 mg by mouth 2 (two) times daily with a meal.     oxyCODONE-acetaminophen 5-325 MG per tablet  Commonly known as:  PERCOCET/ROXICET  Take 1-2 tablets by mouth every 6 (six) hours as needed. For pain     oxyCODONE-acetaminophen 5-325 MG per tablet  Commonly known as:  PERCOCET/ROXICET  Take 1-2 tablets by mouth every 4 (four) hours as needed for moderate pain or severe pain.     PRESERVISION AREDS 2 PO  Take 1 capsule by mouth 2 (two) times daily.     ZYRTEC PO  Take 10 mg by mouth daily.         SignedHosie Spangle, MD 05/05/2013, 12:24 PM

## 2013-05-05 NOTE — Progress Notes (Signed)
Pt and wife given D/C instructions with Rx, verbal understanding was given. Pt D/C'd home via wheelchair @ 1330 per MD order. Pt stable @ D/C and had no other needs. Holli Humbles, RN

## 2013-05-05 NOTE — Discharge Instructions (Signed)

## 2013-05-06 NOTE — ED Provider Notes (Signed)
Medical screening examination/treatment/procedure(s) were performed by non-physician practitioner and as supervising physician I was immediately available for consultation/collaboration.  Leota Jacobsen, MD 05/06/13 [phone]

## 2013-07-08 ENCOUNTER — Encounter (HOSPITAL_COMMUNITY): Payer: Self-pay | Admitting: Pharmacy Technician

## 2013-07-13 NOTE — H&P (Signed)
Mitchell Hunter is an 68 y.o. male.    Chief Complaint: right knee pain  HPI: Pt is a 68 y.o. male complaining of right knee pain for several months. Pain had continually increased since the beginning. X-rays in the clinic show meniscal tear right knee. Pt has tried various conservative treatments which have failed to alleviate their symptoms, including therapy and injections. Various options are discussed with the patient. Risks, benefits and expectations were discussed with the patient. Patient understand the risks, benefits and expectations and wishes to proceed with surgery.   PCP:  No primary provider on file.  D/C Plans:  Home   PMH: Past Medical History  Diagnosis Date  . Hypertension   . Diabetes mellitus without complication     PSH: Past Surgical History  Procedure Laterality Date  . Lumbar laminectomy/decompression microdiscectomy N/A 05/04/2013    Procedure: Right Lumbar Three-Four laminectomy and microdiskectomy ;  Surgeon: Hosie Spangle, MD;  Location: [facility];  Service: Neurosurgery;  Laterality: N/A;  Right Lumbar Three-Four laminectomy and microdiskectomy     Social History:  reports that he has been smoking.  He does not have any smokeless tobacco history on file. He reports that he drinks alcohol. His drug history is not on file.  Allergies:  No Known Allergies  Medications: No current facility-administered medications for this encounter.   Current Outpatient Prescriptions  Medication Sig Dispense Refill  . allopurinol (ZYLOPRIM) 100 MG tablet Take 100 mg by mouth 2 (two) times daily.      Marland Kitchen aspirin EC 81 MG tablet Take 81 mg by mouth daily.      Marland Kitchen atenolol (TENORMIN) 100 MG tablet Take 100 mg by mouth daily.      . benazepril-hydrochlorthiazide (LOTENSIN HCT) 20-12.5 MG per tablet Take 1 tablet by mouth daily.      . Cetirizine HCl (ZYRTEC PO) Take 10 mg by mouth daily.       Marland Kitchen gabapentin (NEURONTIN) 600 MG tablet Take 600 mg by mouth 2 (two) times  daily.       . metFORMIN (GLUCOPHAGE) 1000 MG tablet Take 1,000 mg by mouth 2 (two) times daily with a meal.      . Multiple Vitamin (MULTIVITAMIN) capsule Take 1 capsule by mouth daily.      . Multiple Vitamins-Minerals (PRESERVISION AREDS 2 PO) Take 1 capsule by mouth 2 (two) times daily.      . Omega-3 Fatty Acids (FISH OIL PO) Take 1,200 mg by mouth daily.       Marland Kitchen oxyCODONE-acetaminophen (PERCOCET/ROXICET) 5-325 MG per tablet Take 1-2 tablets by mouth every 6 (six) hours as needed. For pain        No results found for this or any previous visit (from the past 48 hour(s)). No results found.  ROS: Pain with rom of the right lower extremity  Physical Exam:  Alert and oriented 69 y.o. male in no acute distress Cranial nerves 2-12 intact Cervical spine: full rom with no tenderness, nv intact distally Chest: active breath sounds bilaterally, no wheeze rhonchi or rales Heart: regular rate and rhythm, no murmur Abd: non tender non distended with active bowel sounds Hip is stable with rom  Right knee with medial joint line tenderness nv intact distally No rashes or edema  Assessment/Plan Assessment: right knee pain secondary to meniscal tear   Plan: Patient will undergo a right knee arthroscopy by Dr. Veverly Fells at [facility]. Risks benefits and expectations were discussed with the patient. Patient understand  risks, benefits and expectations and wishes to proceed.

## 2013-07-15 ENCOUNTER — Encounter (HOSPITAL_COMMUNITY): Admission: RE | Admit: 2013-07-15 | Payer: Medicare Other | Source: Ambulatory Visit

## 2013-07-15 ENCOUNTER — Encounter (HOSPITAL_COMMUNITY)
Admission: RE | Admit: 2013-07-15 | Discharge: 2013-07-15 | Disposition: A | Discharge status: Home / Self Care | Payer: Medicare Other | Source: Ambulatory Visit | Attending: Orthopedic Surgery | Admitting: Orthopedic Surgery

## 2013-07-15 ENCOUNTER — Encounter (HOSPITAL_COMMUNITY): Payer: Self-pay

## 2013-07-15 DIAGNOSIS — Z01812 Encounter for preprocedural laboratory examination: Secondary | ICD-10-CM | POA: Insufficient documentation

## 2013-07-15 DIAGNOSIS — Z01810 Encounter for preprocedural cardiovascular examination: Secondary | ICD-10-CM | POA: Insufficient documentation

## 2013-07-15 HISTORY — DX: Pneumonia, unspecified organism: J18.9

## 2013-07-15 LAB — CBC
HCT: 40.8 % (ref 39.0–52.0)
Hemoglobin: 14 g/dL (ref 13.0–17.0)
MCH: 30.6 pg (ref 26.0–34.0)
MCHC: 34.3 g/dL (ref 30.0–36.0)
MCV: 89.1 fL (ref 78.0–100.0)
Platelets: 220 10*3/uL (ref 150–400)
RBC: 4.58 MIL/uL (ref 4.22–5.81)
RDW: 14 % (ref 11.5–15.5)
WBC: 10.3 10*3/uL (ref 4.0–10.5)

## 2013-07-15 LAB — BASIC METABOLIC PANEL
BUN: 14 mg/dL (ref 6–23)
CHLORIDE: 98 meq/L (ref 96–112)
CO2: 27 mEq/L (ref 19–32)
Calcium: 9.9 mg/dL (ref 8.4–10.5)
Creatinine, Ser: 1.18 mg/dL (ref 0.50–1.35)
GFR calc Af Amer: 71 mL/min — ABNORMAL LOW (ref >90)
GFR calc non Af Amer: 62 mL/min — ABNORMAL LOW (ref >90)
Glucose, Bld: 121 mg/dL — ABNORMAL HIGH (ref 70–99)
Potassium: 4.1 mEq/L (ref 3.7–5.3)
Sodium: 140 mEq/L (ref 137–147)

## 2013-07-15 NOTE — Pre-Procedure Instructions (Addendum)
DOREN APOSTLE  07/15/2013   Your procedure is scheduled on:  07/22/13  Report to [facility] short stay admitting at 1 PM.  Call this number if you have problems the morning of surgery: [phone]   Remember:   Do not eat food or drink liquids after midnight.   Take these medicines the morning of surgery with A SIP OF WATER: allopurinol,atenolol,gabapentin, pain med if needed         STOP all herbel meds, nsaids (aleve,naproxen,advil,ibuprofen) 5 days prior to surgery including vitamins,aspirin,fish oil       NO DIABETIC MEDS AM OF SURGERY   Do not wear jewelry, make-up or nail polish.  Do not wear lotions, powders, or perfumes. You may wear deodorant.  Do not shave 48 hours prior to surgery. Men may shave face and neck.  Do not bring valuables to the hospital.  [facility] is not responsible                  for any belongings or valuables.               Contacts, dentures or bridgework may not be worn into surgery.  Leave suitcase in the car. After surgery it may be brought to your room.  For patients admitted to the hospital, discharge time is determined by your                treatment team.               Patients discharged the day of surgery will not be allowed to drive  home.  Name and phone number of your driver:   Special Instructions:  Special Instructions: [place] - Preparing for Surgery  Before surgery, you can play an important role.  Because skin is not sterile, your skin needs to be as free of germs as possible.  You can reduce the number of germs on you skin by washing with CHG (chlorahexidine gluconate) soap before surgery.  CHG is an antiseptic cleaner which kills germs and bonds with the skin to continue killing germs even after washing.  Please DO NOT use if you have an allergy to CHG or antibacterial soaps.  If your skin becomes reddened/irritated stop using the CHG and inform your nurse when you arrive at Short Stay.  Do not shave (including legs and  underarms) for at least 48 hours prior to the first CHG shower.  You may shave your face.  Please follow these instructions carefully:   1.  Shower with CHG Soap the night before surgery and the morning of Surgery.  2.  If you choose to wash your hair, wash your hair first as usual with your normal shampoo.  3.  After you shampoo, rinse your hair and body thoroughly to remove the Shampoo.  4.  Use CHG as you would any other liquid soap.  You can apply chg directly  to the skin and wash gently with scrungie or a clean washcloth.  5.  Apply the CHG Soap to your body ONLY FROM THE NECK DOWN.  Do not use on open wounds or open sores.  Avoid contact with your eyes ears, mouth and genitals (private parts).  Wash genitals (private parts)       with your normal soap.  6.  Wash thoroughly, paying special attention to the area where your surgery will be performed.  7.  Thoroughly rinse your body with warm water from the neck down.  8.  DO NOT shower/wash with your normal soap after using and rinsing off the CHG Soap.  9.  Pat yourself dry with a clean towel.            10.  Wear clean pajamas.            11.  Place clean sheets on your bed the night of your first shower and do not sleep with pets.  Day of Surgery  Do not apply any lotions/deodorants the morning of surgery.  Please wear clean clothes to the hospital/surgery center.   Please read over the following fact sheets that you were given: Pain Booklet, Coughing and Deep Breathing and Surgical Site Infection Prevention

## 2013-07-15 NOTE — Progress Notes (Addendum)
req'd from [facility] ? Dr    stress test ? 08.patient states"was neg" Also req'd from dr Ruby Cola [facility] pcp Patient left without cxr . Will need dos

## 2013-07-18 NOTE — Progress Notes (Signed)
Anesthesia chart review:  Patient is a 68 year old male scheduled for right knee arthroscopy on 07/22/13 by Dr. Veverly Fells.  History includes smoking, HTN, DM2, appendectomy 06/08/06, left L4-5 semi-hemilaminectomy and discectomy 08/10/07. He was admitted thru the ED and underwent right L3-4 laminotomy and microdiskectomy with microdissection on 05/04/13. PCP is Dr. Juanita Craver. He reported a "negative" stress test around 2008, but can't remember where it was done.  No copy received from Dr. Barbette Hair office.   EKG on 07/15/13 showed SR with first degree AVB, inferior infarct (age undetermined), possible anterior infarct (age undetermined).  Inferior lead ST elevation is unchanged since at least 08/10/07.  He has multiple EKGs in [facility] (copies printed and placed on his chart).  His EKG appears stable since at least 06/04/06.  He has undergone at least three surgeries since then, last being earlier this year.  He reportedly had a normal stress test around 2008 as well, but we do not have a copy of this. No CV symptoms were documented at his PAT visit.   Preoperative labs noted.   Since he tolerated a neurosurgical procedure within the past three months and his EKG appears stable for the past eight years then I anticipate that if he remains asymptomatic from a CV standpoint then he can proceed as planned.  Further evaluation by his assigned anesthesiologist on the day of surgery.  George Hugh [facility] Short Stay Center/Anesthesiology Phone [phone] 07/18/2013 2:39 PM

## 2013-07-21 MED ORDER — CEFAZOLIN SODIUM-DEXTROSE 2-3 GM-% IV SOLR
2.0000 g | INTRAVENOUS | Status: AC
  Administered 2013-07-22: 2 g via INTRAVENOUS
  Filled 2013-07-21: qty 50

## 2013-07-21 NOTE — Progress Notes (Signed)
Requested  Stress test  From  [place].

## 2013-07-21 NOTE — Progress Notes (Signed)
Notified patient of time change. Instructed to arrive at 1200 pm.

## 2013-07-22 ENCOUNTER — Ambulatory Visit (HOSPITAL_COMMUNITY)
Admission: RE | Admit: 2013-07-22 | Discharge: 2013-07-22 | Disposition: A | Discharge status: Home / Self Care | Payer: Medicare Other | Source: Ambulatory Visit | Attending: Orthopedic Surgery | Admitting: Orthopedic Surgery

## 2013-07-22 ENCOUNTER — Ambulatory Visit (HOSPITAL_COMMUNITY): Payer: Medicare Other

## 2013-07-22 ENCOUNTER — Encounter (HOSPITAL_COMMUNITY)
Admission: RE | Disposition: A | Discharge status: Home / Self Care | Payer: Self-pay | Source: Ambulatory Visit | Attending: Orthopedic Surgery

## 2013-07-22 ENCOUNTER — Encounter (HOSPITAL_COMMUNITY): Payer: Medicare Other | Admitting: Vascular Surgery

## 2013-07-22 ENCOUNTER — Ambulatory Visit (HOSPITAL_COMMUNITY): Payer: Medicare Other | Admitting: Certified Registered Nurse Anesthetist

## 2013-07-22 ENCOUNTER — Encounter (HOSPITAL_COMMUNITY): Payer: Self-pay | Admitting: Certified Registered Nurse Anesthetist

## 2013-07-22 DIAGNOSIS — I252 Old myocardial infarction: Secondary | ICD-10-CM | POA: Insufficient documentation

## 2013-07-22 DIAGNOSIS — I10 Essential (primary) hypertension: Secondary | ICD-10-CM | POA: Insufficient documentation

## 2013-07-22 DIAGNOSIS — Z7982 Long term (current) use of aspirin: Secondary | ICD-10-CM | POA: Insufficient documentation

## 2013-07-22 DIAGNOSIS — M23305 Other meniscus derangements, unspecified medial meniscus, unspecified knee: Secondary | ICD-10-CM | POA: Insufficient documentation

## 2013-07-22 DIAGNOSIS — F17200 Nicotine dependence, unspecified, uncomplicated: Secondary | ICD-10-CM | POA: Insufficient documentation

## 2013-07-22 DIAGNOSIS — M23302 Other meniscus derangements, unspecified lateral meniscus, unspecified knee: Secondary | ICD-10-CM | POA: Insufficient documentation

## 2013-07-22 DIAGNOSIS — E119 Type 2 diabetes mellitus without complications: Secondary | ICD-10-CM | POA: Insufficient documentation

## 2013-07-22 DIAGNOSIS — I509 Heart failure, unspecified: Secondary | ICD-10-CM | POA: Insufficient documentation

## 2013-07-22 DIAGNOSIS — M9420 Chondromalacia, unspecified site: Secondary | ICD-10-CM | POA: Insufficient documentation

## 2013-07-22 HISTORY — PX: KNEE ARTHROSCOPY: SHX127

## 2013-07-22 LAB — GLUCOSE, CAPILLARY
GLUCOSE-CAPILLARY: 102 mg/dL — AB (ref 70–99)
Glucose-Capillary: 167 mg/dL — ABNORMAL HIGH (ref 70–99)
Glucose-Capillary: 118 mg/dL — ABNORMAL HIGH (ref 70–99)

## 2013-07-22 SURGERY — ARTHROSCOPY, KNEE
Anesthesia: General | Site: Knee | Laterality: Right

## 2013-07-22 MED ORDER — CHLORHEXIDINE GLUCONATE 4 % EX LIQD
60.0000 mL | Freq: Once | CUTANEOUS | Status: DC
  Filled 2013-07-22: qty 60

## 2013-07-22 MED ORDER — HYDROCODONE-ACETAMINOPHEN 7.5-325 MG PO TABS: 1.0000 | ORAL_TABLET | Freq: Four times a day (QID) | ORAL | Status: DC | PRN

## 2013-07-22 MED ORDER — ONDANSETRON HCL 4 MG/2ML IJ SOLN: 4.0000 mg | Freq: Once | INTRAMUSCULAR | Status: DC | PRN

## 2013-07-22 MED ORDER — PROPOFOL 10 MG/ML IV BOLUS
INTRAVENOUS | Status: AC
  Filled 2013-07-22: qty 20

## 2013-07-22 MED ORDER — KETOROLAC TROMETHAMINE 30 MG/ML IJ SOLN
INTRAMUSCULAR | Status: DC | PRN
  Administered 2013-07-22: 15 mg via INTRAVENOUS

## 2013-07-22 MED ORDER — BUPIVACAINE-EPINEPHRINE (PF) 0.5% -1:200000 IJ SOLN
INTRAMUSCULAR | Status: AC
  Filled 2013-07-22: qty 10

## 2013-07-22 MED ORDER — SODIUM CHLORIDE 0.9 % IR SOLN
Status: DC | PRN
  Administered 2013-07-22: 3000 mL
  Administered 2013-07-22: 1000 mL

## 2013-07-22 MED ORDER — ATENOLOL 100 MG PO TABS
100.0000 mg | ORAL_TABLET | Freq: Every day | ORAL | Status: DC
  Filled 2013-07-22: qty 1

## 2013-07-22 MED ORDER — OXYCODONE HCL 5 MG PO TABS: 5.0000 mg | ORAL_TABLET | Freq: Once | ORAL | Status: DC | PRN

## 2013-07-22 MED ORDER — LIDOCAINE HCL (CARDIAC) 20 MG/ML IV SOLN
INTRAVENOUS | Status: AC
  Filled 2013-07-22: qty 5

## 2013-07-22 MED ORDER — ROCURONIUM BROMIDE 50 MG/5ML IV SOLN
INTRAVENOUS | Status: AC
Start: 2013-07-22 — End: 2013-07-22
  Filled 2013-07-22: qty 1

## 2013-07-22 MED ORDER — MIDAZOLAM HCL 2 MG/2ML IJ SOLN
INTRAMUSCULAR | Status: AC
  Filled 2013-07-22: qty 2

## 2013-07-22 MED ORDER — ACETAMINOPHEN 325 MG PO TABS: 325.0000 mg | ORAL_TABLET | ORAL | Status: DC | PRN

## 2013-07-22 MED ORDER — LACTATED RINGERS IV SOLN
INTRAVENOUS | Status: DC
  Administered 2013-07-22: 13:00:00 via INTRAVENOUS

## 2013-07-22 MED ORDER — LIDOCAINE HCL (CARDIAC) 20 MG/ML IV SOLN
INTRAVENOUS | Status: DC | PRN
  Administered 2013-07-22: 60 mg via INTRAVENOUS

## 2013-07-22 MED ORDER — FENTANYL CITRATE 0.05 MG/ML IJ SOLN
INTRAMUSCULAR | Status: DC | PRN
  Administered 2013-07-22: 50 ug via INTRAVENOUS

## 2013-07-22 MED ORDER — METHOCARBAMOL 500 MG PO TABS: 500.0000 mg | ORAL_TABLET | Freq: Three times a day (TID) | ORAL | Status: DC | PRN

## 2013-07-22 MED ORDER — EPHEDRINE SULFATE 50 MG/ML IJ SOLN
INTRAMUSCULAR | Status: AC
  Filled 2013-07-22: qty 1

## 2013-07-22 MED ORDER — ONDANSETRON HCL 4 MG/2ML IJ SOLN
INTRAMUSCULAR | Status: AC
  Filled 2013-07-22: qty 2

## 2013-07-22 MED ORDER — LACTATED RINGERS IV SOLN
INTRAVENOUS | Status: DC | PRN
  Administered 2013-07-22 (×2): via INTRAVENOUS

## 2013-07-22 MED ORDER — ONDANSETRON HCL 4 MG/2ML IJ SOLN
INTRAMUSCULAR | Status: DC | PRN
  Administered 2013-07-22: 4 mg via INTRAVENOUS

## 2013-07-22 MED ORDER — BUPIVACAINE-EPINEPHRINE 0.5% -1:200000 IJ SOLN
INTRAMUSCULAR | Status: DC | PRN
  Administered 2013-07-22: 30 mL

## 2013-07-22 MED ORDER — PROPOFOL 10 MG/ML IV BOLUS
INTRAVENOUS | Status: DC | PRN
  Administered 2013-07-22: 160 mg via INTRAVENOUS

## 2013-07-22 MED ORDER — OXYCODONE HCL 5 MG/5ML PO SOLN: 5.0000 mg | Freq: Once | ORAL | Status: DC | PRN

## 2013-07-22 MED ORDER — MIDAZOLAM HCL 5 MG/5ML IJ SOLN
INTRAMUSCULAR | Status: DC | PRN
  Administered 2013-07-22: 2 mg via INTRAVENOUS

## 2013-07-22 MED ORDER — KETOROLAC TROMETHAMINE 30 MG/ML IJ SOLN
INTRAMUSCULAR | Status: AC
  Filled 2013-07-22: qty 1

## 2013-07-22 MED ORDER — STERILE WATER FOR INJECTION IJ SOLN
INTRAMUSCULAR | Status: AC
  Filled 2013-07-22: qty 10

## 2013-07-22 MED ORDER — FENTANYL CITRATE 0.05 MG/ML IJ SOLN
25.0000 ug | INTRAMUSCULAR | Status: DC | PRN
  Administered 2013-07-22: 25 ug via INTRAVENOUS

## 2013-07-22 MED ORDER — FENTANYL CITRATE 0.05 MG/ML IJ SOLN
INTRAMUSCULAR | Status: AC
  Filled 2013-07-22: qty 2

## 2013-07-22 MED ORDER — FENTANYL CITRATE 0.05 MG/ML IJ SOLN
INTRAMUSCULAR | Status: AC
  Filled 2013-07-22: qty 5

## 2013-07-22 MED ORDER — ACETAMINOPHEN 160 MG/5ML PO SOLN
325.0000 mg | ORAL | Status: DC | PRN
  Filled 2013-07-22: qty 20.3

## 2013-07-22 SURGICAL SUPPLY — 35 items
BANDAGE ELASTIC 6 VELCRO ST LF (GAUZE/BANDAGES/DRESSINGS) ×2 IMPLANT
BANDAGE GAUZE ELAST BULKY 4 IN (GAUZE/BANDAGES/DRESSINGS) ×6 IMPLANT
BLADE CUTTER GATOR 3.5 (BLADE) ×3 IMPLANT
BNDG CMPR MED 10X6 ELC LF (GAUZE/BANDAGES/DRESSINGS) ×1
BNDG ELASTIC 6X10 VLCR STRL LF (GAUZE/BANDAGES/DRESSINGS) ×3 IMPLANT
BNDG GAUZE ELAST 4 BULKY (GAUZE/BANDAGES/DRESSINGS) ×4 IMPLANT
CLOSURE WOUND 1/2 X4 (GAUZE/BANDAGES/DRESSINGS) ×1
DRAPE ARTHROSCOPY W/POUCH 114 (DRAPES) ×3 IMPLANT
DURAPREP 26ML APPLICATOR (WOUND CARE) ×3 IMPLANT
ELECT MENISCUS 165MM 90D (ELECTRODE) IMPLANT
ELECT REM PT RETURN 9FT ADLT (ELECTROSURGICAL) ×3
ELECTRODE REM PT RTRN 9FT ADLT (ELECTROSURGICAL) ×1 IMPLANT
GAUZE SPONGE 4X4 16PLY XRAY LF (GAUZE/BANDAGES/DRESSINGS) ×3 IMPLANT
GLOVE BIOGEL PI ORTHO PRO SZ8 (GLOVE) ×2
GLOVE PI ORTHO PRO STRL SZ8 (GLOVE) ×1 IMPLANT
GLOVE SURG ORTHO 8.5 STRL (GLOVE) ×3 IMPLANT
GOWN STRL REUS W/ TWL XL LVL3 (GOWN DISPOSABLE) ×2 IMPLANT
GOWN STRL REUS W/TWL XL LVL3 (GOWN DISPOSABLE) ×6
KIT BASIN OR (CUSTOM PROCEDURE TRAY) ×3 IMPLANT
KIT ROOM TURNOVER OR (KITS) ×3 IMPLANT
MANIFOLD NEPTUNE II (INSTRUMENTS) ×3 IMPLANT
PACK ARTHROSCOPY DSU (CUSTOM PROCEDURE TRAY) ×3 IMPLANT
PAD ARMBOARD 7.5X6 YLW CONV (MISCELLANEOUS) ×6 IMPLANT
PENCIL BUTTON HOLSTER BLD 10FT (ELECTRODE) IMPLANT
SET ARTHROSCOPY TUBING (MISCELLANEOUS) ×3
SET ARTHROSCOPY TUBING LN (MISCELLANEOUS) ×1 IMPLANT
SPONGE GAUZE 4X4 12PLY (GAUZE/BANDAGES/DRESSINGS) ×2 IMPLANT
STRIP CLOSURE SKIN 1/2X4 (GAUZE/BANDAGES/DRESSINGS) ×2 IMPLANT
SUT MNCRL AB 4-0 PS2 18 (SUTURE) ×3 IMPLANT
TOWEL OR 17X24 6PK STRL BLUE (TOWEL DISPOSABLE) ×6 IMPLANT
TUBE CONNECTING 12'X1/4 (SUCTIONS) ×1
TUBE CONNECTING 12X1/4 (SUCTIONS) ×2 IMPLANT
WAND 90 DEG TURBOVAC W/CORD (SURGICAL WAND) ×2 IMPLANT
WATER STERILE IRR 1000ML POUR (IV SOLUTION) ×3 IMPLANT
WRAP KNEE MAXI GEL POST OP (GAUZE/BANDAGES/DRESSINGS) ×3 IMPLANT

## 2013-07-22 NOTE — Discharge Instructions (Signed)
What to eat:  For your first meals, you should eat lightly; only small meals initially.  If you do not have nausea, you may eat larger meals.  Avoid spicy, greasy and heavy food.    General Anesthesia, Adult, Care After  Refer to this sheet in the next few weeks. These instructions provide you with information on caring for yourself after your procedure. Your health care provider may also give you more specific instructions. Your treatment has been planned according to current medical practices, but problems sometimes occur. Call your health care provider if you have any problems or questions after your procedure.  WHAT TO EXPECT AFTER THE PROCEDURE  After the procedure, it is typical to experience:  Sleepiness.  Nausea and vomiting. HOME CARE INSTRUCTIONS  For the first 24 hours after general anesthesia:  Have a responsible person with you.  Do not drive a car. If you are alone, do not take public transportation.  Do not drink alcohol.  Do not take medicine that has not been prescribed by your health care provider.  Do not sign important papers or make important decisions.  You may resume a normal diet and activities as directed by your health care provider.  Change bandages (dressings) as directed.  If you have questions or problems that seem related to general anesthesia, call the hospital and ask for the anesthetist or anesthesiologist on call. SEEK MEDICAL CARE IF:  You have nausea and vomiting that continue the day after anesthesia.  You develop a rash. SEEK IMMEDIATE MEDICAL CARE IF:  You have difficulty breathing.  You have chest pain.  You have any allergic problems. Document Released: 06/30/2000 Document Revised: 11/24/2012 Document Reviewed: 10/07/2012  [facility] Patient Information 2014 [place], [place].   KNEE INSTRUCTIONS:  Use crutches and bear some weight on the right knee over the next week, then ok to bear full weight on the right leg.  Ice the knee as much as  possible.  Do exercises every hour for five minutes - calf pumps, heels slides, straight leg raises  Keep the bandage in place until Monday, then remove the ACE wrap and the gauze, place BandAids over the sutures and then pull up the stocking on the right leg. The left leg stocking that you have on from surgery should remain on unless bathing.  Ok to get the wound wet in the shower next Wednesday.  Follow up in two weeks in the office  [phone].

## 2013-07-22 NOTE — Progress Notes (Signed)
Pt states that he had 1/2 cup black coffee this am.  Dr Ermalene Postin called and informed.

## 2013-07-22 NOTE — Progress Notes (Signed)
Orthopedic Tech Progress Note Patient Details:  Mitchell Hunter [birth year] [id number]  Patient ID: [name], male   DOB: 04/04/1946, 68 y.o.   MRN: [id number] Viewed order from doctor's order list  Hildred Priest 07/22/2013, 6:52 PM

## 2013-07-22 NOTE — Transfer of Care (Signed)
Immediate Anesthesia Transfer of Care Note  Patient: Mitchell Hunter  Procedure(s) Performed: Procedure(s): ARTHROSCOPY KNEE (Right)  Patient Location: PACU  Anesthesia Type:General  Level of Consciousness: awake  Airway & Oxygen Therapy: Patient Spontanous Breathing and Patient connected to nasal cannula oxygen  Post-op Assessment: Report given to PACU RN, Post -op Vital signs reviewed and stable and Patient moving all extremities  Post vital signs: Reviewed and stable  Complications: No apparent anesthesia complications

## 2013-07-22 NOTE — Brief Op Note (Signed)
07/22/2013  5:15 PM  PATIENT:  Mitchell Hunter  68 y.o. male  PRE-OPERATIVE DIAGNOSIS:  Right knee meniscal tear, lateral and medial  POST-OPERATIVE DIAGNOSIS:  Right knee meniscal tear, lateral and medial, chondromalacia, medial Grade IV, lateral Grade III  PROCEDURE:  Procedure(s): ARTHROSCOPY KNEE (Right) partial medial and lateral meniscectomies, chondroplasty not to bleeding bone med and lat  SURGEON:  Surgeon(s) and Role:    * Augustin Schooling, MD - Primary  PHYSICIAN ASSISTANT:   ASSISTANTS: none   ANESTHESIA:   general  EBL:  Total I/O In: 1000 [I.V.:1000] Out: -   BLOOD ADMINISTERED:none  DRAINS: none   LOCAL MEDICATIONS USED:  MARCAINE     SPECIMEN:  No Specimen  DISPOSITION OF SPECIMEN:  N/A  COUNTS:  YES  TOURNIQUET:  * No tourniquets in log *  DICTATION: .Other Dictation: Dictation Number [id number]  PLAN OF CARE: Discharge to home after PACU  PATIENT DISPOSITION:  PACU - hemodynamically stable.   Delay start of Pharmacological VTE agent (>24hrs) due to surgical blood loss or risk of bleeding: not applicable

## 2013-07-22 NOTE — Interval H&P Note (Signed)
History and Physical Interval Note:  07/22/2013 3:43 PM  Mitchell Hunter  has presented today for surgery, with the diagnosis of Right knee mincal tear  The various methods of treatment have been discussed with the patient and family. After consideration of risks, benefits and other options for treatment, the patient has consented to  Procedure(s): ARTHROSCOPY KNEE (Right) as a surgical intervention .  The patient's history has been reviewed, patient examined, no change in status, stable for surgery.  I have reviewed the patient's chart and labs.  Questions were answered to the patient's satisfaction.     Augustin Schooling

## 2013-07-22 NOTE — Anesthesia Postprocedure Evaluation (Signed)
  Anesthesia Post-op Note  Patient: Mitchell Hunter  Procedure(s) Performed: Procedure(s): ARTHROSCOPY KNEE (Right)  Patient Location: PACU  Anesthesia Type:General  Level of Consciousness: awake and alert   Airway and Oxygen Therapy: Patient Spontanous Breathing  Post-op Pain: mild  Post-op Assessment: Post-op Vital signs reviewed, Patient's Cardiovascular Status Stable and Respiratory Function Stable  Post-op Vital Signs: Reviewed  Filed Vitals:   07/22/13 1745  BP: 166/79  Pulse: 67  Temp:   Resp: 12    Complications: No apparent anesthesia complications

## 2013-07-22 NOTE — Anesthesia Preprocedure Evaluation (Addendum)
Anesthesia Evaluation  Patient identified by MRN, date of birth, ID band Patient awake    Reviewed: Allergy & Precautions, H&P , NPO status , Patient's Chart, lab work & pertinent test results, reviewed documented beta blocker date and time   History of Anesthesia Complications Negative for: history of anesthetic complications  Airway Mallampati: II TM Distance: >3 FB Neck ROM: Full    Dental  (+) Dental Advisory Given, Teeth Intact,    Pulmonary Current Smoker,  breath sounds clear to auscultation        Cardiovascular hypertension, Pt. on medications and Pt. on home beta blockers - angina- Past MI and - CHF - dysrhythmias - Valvular Problems/MurmursRhythm:Regular     Neuro/Psych Low back surgery, no numbness weakness negative psych ROS   GI/Hepatic negative GI ROS, Neg liver ROS,   Endo/Other  diabetes, Type 2, Oral Hypoglycemic Agents  Renal/GU negative Renal ROS     Musculoskeletal   Abdominal   Peds  Hematology negative hematology ROS (+)   Anesthesia Other Findings   Reproductive/Obstetrics                         Anesthesia Physical Anesthesia Plan  ASA: II  Anesthesia Plan: General   Post-op Pain Management:    Induction: Intravenous  Airway Management Planned: LMA  Additional Equipment: None  Intra-op Plan:   Post-operative Plan: Extubation in OR  Informed Consent: I have reviewed the patients History and Physical, chart, labs and discussed the procedure including the risks, benefits and alternatives for the proposed anesthesia with the patient or authorized representative who has indicated his/her understanding and acceptance.   Dental advisory given  Plan Discussed with: CRNA, Anesthesiologist and Surgeon  Anesthesia Plan Comments:       Anesthesia Quick Evaluation

## 2013-07-22 NOTE — Progress Notes (Signed)
Orthopedic Tech Progress Note Patient Details:  Mitchell Hunter 20-Oct-1945 [id number]  Ortho Devices Ortho Device/Splint Interventions: Application crutches   [name] 07/22/2013, 6:51 PM

## 2013-07-25 ENCOUNTER — Encounter (HOSPITAL_COMMUNITY): Payer: Self-pay | Admitting: Orthopedic Surgery

## 2013-07-25 NOTE — Op Note (Signed)
NAME:  Mitchell Hunter, Mitchell Hunter NO.:  [id number]  MEDICAL RECORD NO.:  [id number]  LOCATION:                                 FACILITY:  PHYSICIAN:  Doran Heater. Veverly Fells, M.D. DATE OF BIRTH:  04-Apr-1946  DATE OF PROCEDURE:  07/22/2013 DATE OF DISCHARGE:  07/22/2013                              OPERATIVE REPORT   PREOPERATIVE DIAGNOSIS:  Right knee medial meniscus tear.  POSTOPERATIVE DIAGNOSES: 1. Right knee medial meniscus tear. 2. Right knee lateral meniscus tear. 3. Chondromalacia grade 4, medial compartment, and grade 3 lateral     compartment.  PROCEDURE PERFORMED:  Right knee arthroscopy partial, medial, and lateral meniscectomies and chondroplasty not to bleeding bone in medial and lateral compartments.  ATTENDING SURGEON:  Doran Heater. Veverly Fells, MD  ASSISTANT:  None.  ANESTHESIA:  LMA general anesthesia was used.  ESTIMATED BLOOD LOSS:  Minimal.  FLUID REPLACEMENT:  1000 mL crystalloid.  INSTRUMENT COUNTS:  Correct.  COMPLICATIONS:  There were no complications.  ANTIBIOTICS:  Perioperative antibiotics were given.  INDICATIONS:  The patient is a 68 year old male with worsening right medial knee pain.  The patient has had increased pain despite conservative management.  MRI scan indicating torn medial meniscus, and a questionable lateral meniscus tear.  The patient has failed all measures of conservative management, desires operative treatment to restore function, eliminate pain.  Informed consent obtained.  DESCRIPTION OF PROCEDURE:  After an adequate level of anesthesia was achieved, the patient was positioned in the supine position.  Right leg correctly identified, placed in arthroscopic leg holder.  Left leg placed in a well-leg holder.  Sterile prep and drape of the right knee and time-out was called.  We entered the right knee using standard portals including superolateral outflow, anterolateral scope and anteromedial working portals.  We identified  synovitis throughout the knee joint consistent with an inflamed knee.  We did not find significant chondromalacia in patellofemoral femoral joint, may be some grade 2 and grade 3, generalized chondromalacia, nothing requiring chondroplasty.  Medial and lateral gutters inspected, free of loose bodies.  Medial compartment entered.  There was a degenerative medial meniscus tear that involved pretty much the entire middle and posterior horn of meniscus.  Unfortunately, there was grade 4 eburnated bone on the femur and the tibia, adjacent to that tear, we cleaned that tear up with a basket forceps, motorized shaver, smoothing back to a smooth meniscal rim.  The meniscal roots were intact anteriorly and posteriorly.  Some areas of grade 3 chondromalacia with loose flaps and fibrillation, which we could perform a chondroplasty, on smoothing those areas down which were rough, ACL was attenuated, but intact.  The PCL was intact.  ArthroCare unit was used to control bleeding.  Lateral compartment was tight, but we were able to get a good visualization and found a horizontal cleavage tear there.  We used basket forceps and motorized shaver, removed that and probed the remainder of the meniscus, and was probed and felt to be stable.  The lateral compartment had grade 3 chondromalacia with loose flaps and fibrillation, performed the chondroplasty in that compartment, removing unstable articular cartilage.  Really this was a knee that appeared  to have little more arthritis than we expected based on the MRI, but we did fine meniscal tears in both compartments, so we hope treating those will improve the patient's condition with his knee.  We have also done some chondroplasty, smoothed down rough areas.  We did a final inspection of the knee and lavaged.  We sutured the wounds with 3-0 nylon.  Sterile compressive bandage was applied and the patient's leg was wrapped from the toes up above the knee.  The  patient was transported to recovery room in stable condition.     Doran Heater. Veverly Fells, M.D.   ______________________________ Doran Heater. Veverly Fells, M.D.    SRN/MEDQ  D:  07/22/2013  T:  07/23/2013  Job:  [id number]

## 2018-02-01 ENCOUNTER — Other Ambulatory Visit: Payer: Self-pay | Admitting: Family Medicine

## 2018-02-01 ENCOUNTER — Ambulatory Visit
Admission: RE | Admit: 2018-02-01 | Discharge: 2018-02-01 | Disposition: A | Discharge status: Home / Self Care | Payer: Medicare Other | Source: Ambulatory Visit | Attending: Family Medicine | Admitting: Family Medicine

## 2018-02-01 DIAGNOSIS — R52 Pain, unspecified: Secondary | ICD-10-CM

## 2018-02-11 ENCOUNTER — Telehealth: Payer: Self-pay

## 2018-02-11 NOTE — Telephone Encounter (Signed)
SENT REFERRAL TO SCHEDULING AND FILED NOTES 

## 2018-02-22 ENCOUNTER — Ambulatory Visit: Payer: Medicare Other | Admitting: Cardiology

## 2018-02-22 ENCOUNTER — Encounter: Payer: Self-pay | Admitting: Cardiology

## 2018-02-22 VITALS — BP 188/78 | HR 58 | Ht 72.0 in | Wt 166.0 lb

## 2018-02-22 DIAGNOSIS — Z720 Tobacco use: Secondary | ICD-10-CM | POA: Diagnosis not present

## 2018-02-22 DIAGNOSIS — R0989 Other specified symptoms and signs involving the circulatory and respiratory systems: Secondary | ICD-10-CM

## 2018-02-22 DIAGNOSIS — R072 Precordial pain: Secondary | ICD-10-CM

## 2018-02-22 DIAGNOSIS — I10 Essential (primary) hypertension: Secondary | ICD-10-CM | POA: Diagnosis not present

## 2018-02-22 NOTE — Progress Notes (Signed)
Referring-Brent Tollie Pizza, MD Reason for referral-chest pain  HPI: 72 year old male for evaluation of chest pain request of Juanita Craver, MD. For the past 1 month patient has had pain in the right rib area radiating to his back.  It is not exertional, pleuritic, positional or related to food.  It lasts seconds to 1 minute.  Resolve spontaneously.  There is some shortness of breath but no nausea or diaphoresis.  Otherwise patient denies dyspnea on exertion, orthopnea, PND, pedal edema, exertional chest pain or syncope.  Note chest CT February 01, 2018 showed aortic atherosclerosis and emphysema.  Cardiology now asked to evaluate.  Current Outpatient Medications  Medication Sig Dispense Refill  . allopurinol (ZYLOPRIM) 100 MG tablet Take 100 mg by mouth 2 (two) times daily.    Marland Kitchen ALPRAZolam (XANAX) 0.25 MG tablet Take 0.25-0.5 mg by mouth at bedtime.    Marland Kitchen aspirin EC 81 MG tablet Take 81 mg by mouth daily.    Marland Kitchen atenolol (TENORMIN) 100 MG tablet Take 100 mg by mouth daily.    . benazepril-hydrochlorthiazide (LOTENSIN HCT) 20-12.5 MG per tablet Take 1 tablet by mouth daily.    . Cetirizine HCl (ZYRTEC PO) Take 10 mg by mouth daily.     Marland Kitchen gabapentin (NEURONTIN) 600 MG tablet Take 600 mg by mouth 2 (two) times daily.     Marland Kitchen HYDROcodone-acetaminophen (NORCO) 7.5-325 MG per tablet Take 1-2 tablets by mouth every 6 (six) hours as needed for moderate pain. 60 tablet 0  . metFORMIN (GLUCOPHAGE) 1000 MG tablet Take 1,000 mg by mouth 2 (two) times daily with a meal.    . Multiple Vitamin (MULTIVITAMIN) capsule Take 1 capsule by mouth daily.    . Multiple Vitamins-Minerals (PRESERVISION AREDS 2 PO) Take 1 capsule by mouth 2 (two) times daily.    . Omega-3 Fatty Acids (FISH OIL PO) Take 1,200 mg by mouth daily.      No current facility-administered medications for this visit.     No Known Allergies   Past Medical History:  Diagnosis Date  . Anxiety   . Chronic renal insufficiency   . Diabetes  mellitus without complication ([place])   . Gout   . Hyperlipidemia   . Hypertension   . Pneumonia    hx    Past Surgical History:  Procedure Laterality Date  . APPENDECTOMY    . ELBOW ARTHROSCOPY Right    torn cartilage  . KNEE ARTHROSCOPY Left    cartilage  . KNEE ARTHROSCOPY Right 07/22/2013   Procedure: ARTHROSCOPY KNEE;  Surgeon: Augustin Schooling, MD;  Location: [facility];  Service: Orthopedics;  Laterality: Right;  . LUMBAR LAMINECTOMY/DECOMPRESSION MICRODISCECTOMY N/A 05/04/2013   Procedure: Right Lumbar Three-Four laminectomy and microdiskectomy ;  Surgeon: Hosie Spangle, MD;  Location: [facility];  Service: Neurosurgery;  Laterality: N/A;  Right Lumbar Three-Four laminectomy and microdiskectomy     Social History   Socioeconomic History  . Marital status: Married    Spouse name: Not on file  . Number of children: 3  . Years of education: Not on file  . Highest education level: Not on file  Occupational History  . Not on file  Social Needs  . Financial resource strain: Not on file  . Food insecurity:    Worry: Not on file    Inability: Not on file  . Transportation needs:    Medical: Not on file    Non-medical: Not on file  Tobacco Use  . Smoking status: Current  Every Day Smoker    Packs/day: 1.50    Years: 50.00    Pack years: 75.00    Types: Cigarettes  . Smokeless tobacco: Former Systems developer    Quit date: 02/23/1988  Substance and Sexual Activity  . Alcohol use: Yes    Comment: Daily; 3-4 drinks per day  . Drug use: No  . Sexual activity: Not on file  Lifestyle  . Physical activity:    Days per week: Not on file    Minutes per session: Not on file  . Stress: Not on file  Relationships  . Social connections:    Talks on phone: Not on file    Gets together: Not on file    Attends religious service: Not on file    Active member of club or organization: Not on file    Attends meetings of clubs or organizations: Not on file    Relationship status: Not on file   . Intimate partner violence:    Fear of current or ex partner: Not on file    Emotionally abused: Not on file    Physically abused: Not on file    Forced sexual activity: Not on file  Other Topics Concern  . Not on file  Social History Narrative  . Not on file    Family History  Problem Relation Age of Onset  . Dementia Mother   . Lung disease Father     ROS: Some pain in hips and knees but no fevers or chills, productive cough, hemoptysis, dysphasia, odynophagia, melena, hematochezia, dysuria, hematuria, rash, seizure activity, orthopnea, PND, pedal edema, claudication. Remaining systems are negative.  Physical Exam:   Blood pressure (!) 188/78, pulse (!) 58, height 6' (1.829 m), weight 166 lb (75.3 kg).  General:  Well developed/well nourished in NAD Skin warm/dry Patient not depressed No peripheral clubbing Back-normal HEENT-normal/normal eyelids Neck supple/normal carotid upstroke bilaterally; no bruits; no JVD; no thyromegaly chest - CTA/ normal expansion CV - RRR/normal S1 and S2; no murmurs, rubs or gallops;  PMI nondisplaced Abdomen -NT/ND, no HSM, no mass, + bowel sounds, positive bruit 2+ femoral pulses, right femoral bruit Ext-no edema, chords; diminished distal pulses Neuro-grossly nonfocal  ECG -sinus rhythm with first-degree AV block, poor R wave progression.  Personally reviewed  A/P  1 chest pain-symptoms extremely atypical.  Possibly musculoskeletal.  Multiple risk factors including diabetes mellitus, hypertension, hyperlipidemia and tobacco abuse.  We will arrange a [place] nuclear study for risk stratification.  He states he cannot keep up with the treadmill.  2 bruit-schedule abdominal ultrasound to exclude aneurysm.  3 tobacco abuse-patient counseled on discontinuing.  4 alcohol use-patient drinks 4 drinks daily.  I have asked him to decrease to no more than 2.  5 hypertension-blood pressure is elevated today but he has not taken his  medications.  They will follow and his medications can be advanced as needed.  6 hyperlipidemia-Per primary care.  Kirk Ruths, MD

## 2018-02-22 NOTE — Patient Instructions (Signed)
Medication Instructions:  NO CHANGE If you need a refill on your cardiac medications before your next appointment, please call your pharmacy.   Lab work: If you have labs (blood work) drawn today and your tests are completely normal, you will receive your results only by: Marland Kitchen MyChart Message (if you have MyChart) OR . A paper copy in the mail If you have any lab test that is abnormal or we need to change your treatment, we will call you to review the results.  Testing/Procedures: Your physician has requested that you have a lexiscan myoview. For further information please visit [email or web address]. Please follow instruction sheet, as given.   Your physician has requested that you have an abdominal aorta duplex. During this test, an ultrasound is used to evaluate the aorta. Allow 30 minutes for this exam. Do not eat after midnight the day before and avoid carbonated beverages   Follow-Up: Your physician recommends that you schedule a follow-up appointment in: AS NEEDED PENDING TEST RESULTS

## 2018-03-12 ENCOUNTER — Telehealth (HOSPITAL_COMMUNITY): Payer: Self-pay

## 2018-03-12 NOTE — Telephone Encounter (Signed)
Encounter complete. 

## 2018-03-17 ENCOUNTER — Ambulatory Visit (HOSPITAL_COMMUNITY)
Admission: RE | Admit: 2018-03-17 | Discharge: 2018-03-17 | Disposition: A | Discharge status: Home / Self Care | Payer: Medicare Other | Source: Ambulatory Visit | Attending: Cardiovascular Disease | Admitting: Cardiovascular Disease

## 2018-03-17 ENCOUNTER — Ambulatory Visit (HOSPITAL_BASED_OUTPATIENT_CLINIC_OR_DEPARTMENT_OTHER)
Admission: RE | Admit: 2018-03-17 | Discharge: 2018-03-17 | Disposition: A | Discharge status: Home / Self Care | Payer: Medicare Other | Source: Ambulatory Visit | Attending: Cardiology | Admitting: Cardiology

## 2018-03-17 DIAGNOSIS — R0989 Other specified symptoms and signs involving the circulatory and respiratory systems: Secondary | ICD-10-CM

## 2018-03-17 DIAGNOSIS — R072 Precordial pain: Secondary | ICD-10-CM

## 2018-03-17 LAB — MYOCARDIAL PERFUSION IMAGING
CHL CUP NUCLEAR SDS: 1
CHL CUP NUCLEAR SSS: 6
CSEPPHR: 90 {beats}/min
LV dias vol: 100 mL (ref 62–150)
LV sys vol: 42 mL
NUC STRESS TID: 1.09
Rest HR: 57 {beats}/min
SRS: 5

## 2018-03-17 MED ORDER — TECHNETIUM TC 99M TETROFOSMIN IV KIT
29.5000 | PACK | Freq: Once | INTRAVENOUS | Status: AC | PRN
  Administered 2018-03-17: 29.5 via INTRAVENOUS
  Filled 2018-03-17: qty 30

## 2018-03-17 MED ORDER — TECHNETIUM TC 99M TETROFOSMIN IV KIT
9.5000 | PACK | Freq: Once | INTRAVENOUS | Status: AC | PRN
  Administered 2018-03-17: 9.5 via INTRAVENOUS
  Filled 2018-03-17: qty 10

## 2018-03-17 MED ORDER — REGADENOSON 0.4 MG/5ML IV SOLN
0.4000 mg | Freq: Once | INTRAVENOUS | Status: AC
  Administered 2018-03-17: 0.4 mg via INTRAVENOUS

## 2018-04-21 NOTE — Addendum Note (Signed)
Encounter addended by: Blenda Bridegroom on: 04/21/2018 10:42 AM  Actions taken: Imaging Exam begun

## 2018-07-01 ENCOUNTER — Emergency Department (HOSPITAL_COMMUNITY): Payer: Medicare Other

## 2018-07-01 ENCOUNTER — Inpatient Hospital Stay (HOSPITAL_COMMUNITY)
Admission: EM | Admit: 2018-07-01 | Discharge: 2018-07-03 | DRG: 641 | Disposition: A | Discharge status: Home Health | Payer: Medicare Other | Attending: Internal Medicine | Admitting: Internal Medicine

## 2018-07-01 ENCOUNTER — Encounter (HOSPITAL_COMMUNITY): Payer: Self-pay

## 2018-07-01 ENCOUNTER — Other Ambulatory Visit: Payer: Self-pay

## 2018-07-01 DIAGNOSIS — N183 Chronic kidney disease, stage 3 (moderate): Secondary | ICD-10-CM

## 2018-07-01 DIAGNOSIS — S0081XA Abrasion of other part of head, initial encounter: Secondary | ICD-10-CM

## 2018-07-01 DIAGNOSIS — E785 Hyperlipidemia, unspecified: Secondary | ICD-10-CM | POA: Diagnosis present

## 2018-07-01 DIAGNOSIS — N390 Urinary tract infection, site not specified: Secondary | ICD-10-CM

## 2018-07-01 DIAGNOSIS — F17210 Nicotine dependence, cigarettes, uncomplicated: Secondary | ICD-10-CM | POA: Diagnosis present

## 2018-07-01 DIAGNOSIS — M109 Gout, unspecified: Secondary | ICD-10-CM | POA: Diagnosis present

## 2018-07-01 DIAGNOSIS — E860 Dehydration: Principal | ICD-10-CM

## 2018-07-01 DIAGNOSIS — I129 Hypertensive chronic kidney disease with stage 1 through stage 4 chronic kidney disease, or unspecified chronic kidney disease: Secondary | ICD-10-CM | POA: Diagnosis present

## 2018-07-01 DIAGNOSIS — Z7984 Long term (current) use of oral hypoglycemic drugs: Secondary | ICD-10-CM | POA: Diagnosis not present

## 2018-07-01 DIAGNOSIS — L8990 Pressure ulcer of unspecified site, unspecified stage: Secondary | ICD-10-CM | POA: Diagnosis present

## 2018-07-01 DIAGNOSIS — Z7982 Long term (current) use of aspirin: Secondary | ICD-10-CM | POA: Diagnosis not present

## 2018-07-01 DIAGNOSIS — E1122 Type 2 diabetes mellitus with diabetic chronic kidney disease: Secondary | ICD-10-CM | POA: Diagnosis present

## 2018-07-01 DIAGNOSIS — E876 Hypokalemia: Secondary | ICD-10-CM | POA: Diagnosis present

## 2018-07-01 DIAGNOSIS — N179 Acute kidney failure, unspecified: Secondary | ICD-10-CM | POA: Diagnosis present

## 2018-07-01 DIAGNOSIS — F1010 Alcohol abuse, uncomplicated: Secondary | ICD-10-CM | POA: Diagnosis present

## 2018-07-01 DIAGNOSIS — N189 Chronic kidney disease, unspecified: Secondary | ICD-10-CM | POA: Diagnosis present

## 2018-07-01 DIAGNOSIS — Z91048 Other nonmedicinal substance allergy status: Secondary | ICD-10-CM | POA: Diagnosis not present

## 2018-07-01 DIAGNOSIS — N182 Chronic kidney disease, stage 2 (mild): Secondary | ICD-10-CM | POA: Diagnosis present

## 2018-07-01 DIAGNOSIS — W19XXXA Unspecified fall, initial encounter: Secondary | ICD-10-CM

## 2018-07-01 DIAGNOSIS — R001 Bradycardia, unspecified: Secondary | ICD-10-CM

## 2018-07-01 LAB — COMPREHENSIVE METABOLIC PANEL
ALT: 18 U/L (ref 0–44)
AST: 25 U/L (ref 15–41)
Albumin: 3.5 g/dL (ref 3.5–5.0)
Alkaline Phosphatase: 53 U/L (ref 38–126)
Anion gap: 15 (ref 5–15)
BUN: 26 mg/dL — ABNORMAL HIGH (ref 8–23)
CO2: 22 mmol/L (ref 22–32)
Calcium: 9.1 mg/dL (ref 8.9–10.3)
Chloride: 99 mmol/L (ref 98–111)
Creatinine, Ser: 2.37 mg/dL — ABNORMAL HIGH (ref 0.61–1.24)
GFR calc Af Amer: 30 mL/min — ABNORMAL LOW (ref >60)
GFR calc non Af Amer: 26 mL/min — ABNORMAL LOW (ref >60)
Glucose, Bld: 103 mg/dL — ABNORMAL HIGH (ref 70–99)
Potassium: 3.8 mmol/L (ref 3.5–5.1)
Sodium: 136 mmol/L (ref 135–145)
Total Bilirubin: 1.1 mg/dL (ref 0.3–1.2)
Total Protein: 6.3 g/dL — ABNORMAL LOW (ref 6.5–8.1)

## 2018-07-01 LAB — URINALYSIS, ROUTINE W REFLEX MICROSCOPIC
Bilirubin Urine: NEGATIVE
Glucose, UA: NEGATIVE mg/dL
Ketones, ur: 20 mg/dL — AB
Leukocytes,Ua: NEGATIVE
Nitrite: NEGATIVE
Protein, ur: 100 mg/dL — AB
Specific Gravity, Urine: 1.024 (ref 1.005–1.030)
pH: 5 (ref 5.0–8.0)

## 2018-07-01 LAB — CBC WITH DIFFERENTIAL/PLATELET
Abs Immature Granulocytes: 0.02 10*3/uL (ref 0.00–0.07)
Basophils Absolute: 0 10*3/uL (ref 0.0–0.1)
Basophils Relative: 0 %
Eosinophils Absolute: 0.2 10*3/uL (ref 0.0–0.5)
Eosinophils Relative: 3 %
HCT: 40.8 % (ref 39.0–52.0)
Hemoglobin: 13.2 g/dL (ref 13.0–17.0)
Immature Granulocytes: 0 %
Lymphocytes Relative: 26 %
Lymphs Abs: 1.7 10*3/uL (ref 0.7–4.0)
MCH: 31.2 pg (ref 26.0–34.0)
MCHC: 32.4 g/dL (ref 30.0–36.0)
MCV: 96.5 fL (ref 80.0–100.0)
Monocytes Absolute: 0.5 10*3/uL (ref 0.1–1.0)
Monocytes Relative: 7 %
Neutro Abs: 4.4 10*3/uL (ref 1.7–7.7)
Neutrophils Relative %: 64 %
Platelets: 157 10*3/uL (ref 150–400)
RBC: 4.23 MIL/uL (ref 4.22–5.81)
RDW: 13.9 % (ref 11.5–15.5)
WBC: 6.8 10*3/uL (ref 4.0–10.5)
nRBC: 0 % (ref 0.0–0.2)

## 2018-07-01 LAB — GLUCOSE, CAPILLARY
GLUCOSE-CAPILLARY: 102 mg/dL — AB (ref 70–99)
Glucose-Capillary: 89 mg/dL (ref 70–99)

## 2018-07-01 LAB — TROPONIN I: Troponin I: <0.03 ng/mL (ref <0.03)

## 2018-07-01 MED ORDER — ATENOLOL 50 MG PO TABS
100.0000 mg | ORAL_TABLET | Freq: Every day | ORAL | Status: DC
  Administered 2018-07-01 – 2018-07-02 (×2): 100 mg via ORAL
  Filled 2018-07-01 (×2): qty 2

## 2018-07-01 MED ORDER — FENTANYL CITRATE (PF) 100 MCG/2ML IJ SOLN
100.0000 ug | Freq: Once | INTRAMUSCULAR | Status: AC
  Administered 2018-07-01: 50 ug via INTRAVENOUS
  Filled 2018-07-01: qty 2

## 2018-07-01 MED ORDER — ONDANSETRON HCL 4 MG PO TABS: 4.0000 mg | ORAL_TABLET | Freq: Four times a day (QID) | ORAL | Status: DC | PRN

## 2018-07-01 MED ORDER — ALPRAZOLAM 0.25 MG PO TABS
0.2500 mg | ORAL_TABLET | Freq: Every day | ORAL | Status: DC
  Administered 2018-07-01 – 2018-07-02 (×2): 0.25 mg via ORAL
  Filled 2018-07-01 (×2): qty 1

## 2018-07-01 MED ORDER — ASPIRIN EC 81 MG PO TBEC
81.0000 mg | DELAYED_RELEASE_TABLET | Freq: Every day | ORAL | Status: DC
  Administered 2018-07-01 – 2018-07-03 (×3): 81 mg via ORAL
  Filled 2018-07-01 (×3): qty 1

## 2018-07-01 MED ORDER — HYDROCODONE-ACETAMINOPHEN 7.5-325 MG PO TABS
1.0000 | ORAL_TABLET | Freq: Four times a day (QID) | ORAL | Status: DC | PRN
  Administered 2018-07-01 – 2018-07-03 (×3): 2 via ORAL
  Filled 2018-07-01 (×3): qty 2

## 2018-07-01 MED ORDER — INSULIN ASPART 100 UNIT/ML ~~LOC~~ SOLN: 0.0000 [IU] | Freq: Every day | SUBCUTANEOUS | Status: DC

## 2018-07-01 MED ORDER — ACETAMINOPHEN 650 MG RE SUPP: 650.0000 mg | Freq: Four times a day (QID) | RECTAL | Status: DC | PRN

## 2018-07-01 MED ORDER — DEXTROSE-NACL 5-0.9 % IV SOLN
INTRAVENOUS | Status: DC
  Administered 2018-07-01 – 2018-07-02 (×2): via INTRAVENOUS

## 2018-07-01 MED ORDER — ACETAMINOPHEN 325 MG PO TABS: 650.0000 mg | ORAL_TABLET | Freq: Four times a day (QID) | ORAL | Status: DC | PRN

## 2018-07-01 MED ORDER — HYDRALAZINE HCL 20 MG/ML IJ SOLN
5.0000 mg | Freq: Four times a day (QID) | INTRAMUSCULAR | Status: DC | PRN
  Administered 2018-07-02: 5 mg via INTRAVENOUS
  Filled 2018-07-01: qty 1

## 2018-07-01 MED ORDER — ENOXAPARIN SODIUM 40 MG/0.4ML ~~LOC~~ SOLN: 40.0000 mg | SUBCUTANEOUS | Status: DC

## 2018-07-01 MED ORDER — LEVOFLOXACIN IN D5W 750 MG/150ML IV SOLN
750.0000 mg | Freq: Once | INTRAVENOUS | Status: AC
  Administered 2018-07-01: 750 mg via INTRAVENOUS
  Filled 2018-07-01: qty 150

## 2018-07-01 MED ORDER — SODIUM CHLORIDE 0.9 % IV BOLUS
500.0000 mL | Freq: Once | INTRAVENOUS | Status: AC
  Administered 2018-07-01: 500 mL via INTRAVENOUS

## 2018-07-01 MED ORDER — LEVOFLOXACIN IN D5W 500 MG/100ML IV SOLN: 500.0000 mg | INTRAVENOUS | Status: DC

## 2018-07-01 MED ORDER — HYDRALAZINE HCL 25 MG PO TABS
25.0000 mg | ORAL_TABLET | Freq: Two times a day (BID) | ORAL | Status: DC
  Administered 2018-07-01 – 2018-07-02 (×2): 25 mg via ORAL
  Filled 2018-07-01 (×2): qty 1

## 2018-07-01 MED ORDER — ENOXAPARIN SODIUM 30 MG/0.3ML ~~LOC~~ SOLN
30.0000 mg | SUBCUTANEOUS | Status: DC
  Administered 2018-07-01 – 2018-07-02 (×2): 30 mg via SUBCUTANEOUS
  Filled 2018-07-01 (×2): qty 0.3

## 2018-07-01 MED ORDER — ONDANSETRON HCL 4 MG/2ML IJ SOLN: 4.0000 mg | Freq: Four times a day (QID) | INTRAMUSCULAR | Status: DC | PRN

## 2018-07-01 MED ORDER — INSULIN ASPART 100 UNIT/ML ~~LOC~~ SOLN
0.0000 [IU] | Freq: Three times a day (TID) | SUBCUTANEOUS | Status: DC
  Administered 2018-07-02: 1 [IU] via SUBCUTANEOUS
  Administered 2018-07-02: 2 [IU] via SUBCUTANEOUS

## 2018-07-01 NOTE — Progress Notes (Signed)
Pharmacy Antibiotic Note  MERREL CRABBE is a 73 y.o. male admitted on 07/01/2018 with concern for UTI.  Pharmacy has been consulted for levofloxacin dosing. On admit patient afebrile, WBC wnl, elevated Scr of 2.37. Patient received levofloxacin 750 mg IV x 1 in ED on 3/26.   Plan: Levofloxacin 500 mg IV q48hr Monitor Scr, CBC, and clinical progress F/u C&S, de-escalation plans, and LOT     Temp (24hrs), Avg:98.4 F (36.9 C), Min:98.4 F (36.9 C), Max:98.4 F (36.9 C)  Recent Labs  Lab 07/01/18 1126  WBC 6.8  CREATININE 2.37*    CrCl cannot be calculated (Unknown ideal weight.).    Allergies  Allergen Reactions  . Adhesive [Tape] Other (See Comments)    SKIN IS VERY THIN AND WILL TEAR EASILY- WILL NEED EITHER A TAPE ALTERNATIVE OR PAPER TAPE    Antimicrobials this admission: Levofloxacin 3/26 >>   Dose adjustments this admission: N/A  Microbiology results: 3/26 UCx:    Thank you for allowing pharmacy to be a part of this patient's care.  Leron Croak, PharmD PGY1 Pharmacy Resident Phone: [phone]  Please check AMION for all [facility] phone numbers 07/01/2018 3:28 PM

## 2018-07-01 NOTE — ED Notes (Addendum)
Pt attempting use urinal again. Pt is much more alert now after receiving fluid bolus.

## 2018-07-01 NOTE — ED Triage Notes (Signed)
Per [facility], pt from home with complaint of generalized weakness x 1 week. This morning pt went to get up and his legs gave out causing him to fall face first on the floor. Hit forehead and has left wrist pain. No syncope.

## 2018-07-01 NOTE — ED Notes (Signed)
Pt's wife would like to be contacted at [phone] in regards to her husband.

## 2018-07-01 NOTE — H&P (Addendum)
[facility]                                                                                    Patient Demographics  Mitchell Hunter, is a 73 y.o. male  CSN: [id number]  MRN: [id number]  DOB - Jan 30, 1946  Admit Date - 07/01/2018  Outpatient Primary MD for the patient is Christain Sacramento, MD   With History of -  Past Medical History:  Diagnosis Date  . Anxiety   . Chronic renal insufficiency   . Diabetes mellitus without complication ([place])   . Gout   . Hyperlipidemia   . Hypertension   . Pneumonia    hx      Past Surgical History:  Procedure Laterality Date  . APPENDECTOMY    . ELBOW ARTHROSCOPY Right    torn cartilage  . KNEE ARTHROSCOPY Left    cartilage  . KNEE ARTHROSCOPY Right 07/22/2013   Procedure: ARTHROSCOPY KNEE;  Surgeon: Augustin Schooling, MD;  Location: [place];  Service: Orthopedics;  Laterality: Right;  . LUMBAR LAMINECTOMY/DECOMPRESSION MICRODISCECTOMY N/A 05/04/2013   Procedure: Right Lumbar Three-Four laminectomy and microdiskectomy ;  Surgeon: Hosie Spangle, MD;  Location: [place] [facility];  Service: Neurosurgery;  Laterality: N/A;  Right Lumbar Three-Four laminectomy and microdiskectomy     in for   Chief Complaint  Patient presents with  . Weakness     HPI  Mitchell Hunter  is a 73 y.o. male, past medical history significant for hypertension, diabetes mellitus type 2 and chronic renal insufficiency stage II-3 presenting with 1 week history of gradual increasing weakness and poor p.o. intake.  Patient denies any history of cough, chest pains or shortness of breath.  Patient denies any fever or chills, diarrhea.  He had a fall today while getting out off the couch.  No preceding chest pains palpitations or aura.  No loss of consciousness although he does not remember the episode.  According to his wife there was increased confusion. Work-up in the emergency room showed acute kidney injury knowing that patient is on Glucophage.  Urine analysis is  questionable.  CT of the head neck and x-rays were all negative for injury from the fall.    Review of Systems    In addition to the HPI above,   No Headache, No changes with Vision or hearing, No problems swallowing food or Liquids, No Chest pain, Cough or Shortness of Breath, No Abdominal pain, No Nausea or Vommitting, Bowel movements are regular, No Blood in stool or Urine, No dysuria, No new skin rashes or bruises, No new joints pains-aches,  No new weakness, tingling, numbness in any extremity, No recent weight gain or loss, No polyuria, polydypsia or polyphagia, No significant Mental Stressors.  A full 10 point Review of Systems was done, except as stated above, all other Review of Systems were negative.   Social History Social History   Tobacco Use  . Smoking status: Current Every Day Smoker    Packs/day: 1.50    Years: 50.00    Pack years: 75.00    Types: Cigarettes  . Smokeless tobacco: Former Systems developer    Quit date: 02/23/1988  Substance Use Topics  . Alcohol use: Yes    Comment: Daily; 3-4 drinks per day     Family History Family History  Problem Relation Age of Onset  . Dementia Mother   . Lung disease Father      Prior to Admission medications   Medication Sig Start Date End Date Taking? Authorizing Provider  allopurinol (ZYLOPRIM) 100 MG tablet Take 100 mg by mouth 2 (two) times daily. 04/19/13   [provider]  ALPRAZolam Duanne Moron) 0.25 MG tablet Take 0.25-0.5 mg by mouth at bedtime.    [provider]  aspirin EC 81 MG tablet Take 81 mg by mouth daily.    [provider]  atenolol (TENORMIN) 100 MG tablet Take 100 mg by mouth daily.    [provider]  benazepril-hydrochlorthiazide (LOTENSIN HCT) 20-12.5 MG per tablet Take 1 tablet by mouth daily.    [provider]  Cetirizine HCl (ZYRTEC PO) Take 10 mg by mouth daily.     [provider]  gabapentin (NEURONTIN) 600 MG tablet Take 600 mg by mouth  2 (two) times daily.     [provider]  HYDROcodone-acetaminophen (NORCO) 7.5-325 MG per tablet Take 1-2 tablets by mouth every 6 (six) hours as needed for moderate pain. 07/22/13   Netta Cedars, MD  metFORMIN (GLUCOPHAGE) 1000 MG tablet Take 1,000 mg by mouth 2 (two) times daily with a meal.    [provider]  Multiple Vitamin (MULTIVITAMIN) capsule Take 1 capsule by mouth daily.    [provider]  Multiple Vitamins-Minerals (PRESERVISION AREDS 2 PO) Take 1 capsule by mouth 2 (two) times daily.    [provider]  Omega-3 Fatty Acids (FISH OIL PO) Take 1,200 mg by mouth daily.     [provider]    No Known Allergies  Physical Exam  Vitals  Blood pressure (!) 173/76, pulse (!) 51, temperature 98.4 F (36.9 C), temperature source Oral, resp. rate 14, SpO2 96 %.   1. General elderly gentleman, chronically ill looks acutely tired  2. Normal affect and insight, Not Suicidal or Homicidal, Awake Alert, Oriented X 3.  3. No F.N deficits, grossly, patient moving all extremities.  4. Ears and Eyes appear Normal, Conjunctivae clear, PERRLA.  Dry oral Mucosa.  Forehead ecchymosis noted  5. Supple Neck, No JVD, No cervical lymphadenopathy appriciated, No Carotid Bruits.  6. Symmetrical Chest wall movement, Good air movement bilaterally, CTAB.  7. RRR, No Gallops, Rubs or Murmurs, No Parasternal Heave.  8. Positive Bowel Sounds, Abdomen Soft, Non tender, No organomegaly appriciated,No rebound -guarding or rigidity.  9.  No Cyanosis, Normal Skin Turgor, No Skin Rash or Bruise.  10. Good muscle tone,  joints appear normal , no effusions, Normal ROM.    Data Review  CBC Recent Labs  Lab 07/01/18 1126  WBC 6.8  HGB 13.2  HCT 40.8  PLT 157  MCV 96.5  MCH 31.2  MCHC 32.4  RDW 13.9  LYMPHSABS 1.7  MONOABS 0.5  EOSABS 0.2  BASOSABS 0.0    ------------------------------------------------------------------------------------------------------------------  Chemistries  Recent Labs  Lab 07/01/18 1126  NA 136  K 3.8  CL 99  CO2 22  GLUCOSE 103*  BUN 26*  CREATININE 2.37*  CALCIUM 9.1  AST 25  ALT 18  ALKPHOS 53  BILITOT 1.1   ------------------------------------------------------------------------------------------------------------------ CrCl cannot be calculated (Unknown ideal weight.). ------------------------------------------------------------------------------------------------------------------ No results for input(s): TSH, T4TOTAL, T3FREE, THYROIDAB in the last 72 hours.  Invalid input(s): FREET3  Coagulation profile No results for input(s): INR, PROTIME in the last 168 hours. ------------------------------------------------------------------------------------------------------------------- No results for input(s): DDIMER in the last 72 hours. -------------------------------------------------------------------------------------------------------------------  Cardiac Enzymes Recent Labs  Lab 07/01/18 1126  TROPONINI <0.03   ------------------------------------------------------------------------------------------------------------------ Invalid input(s): POCBNP   ---------------------------------------------------------------------------------------------------------------  Urinalysis    Component Value Date/Time   COLORURINE AMBER (A) 07/01/2018 1324   APPEARANCEUR HAZY (A) 07/01/2018 1324   LABSPEC 1.024 07/01/2018 1324   PHURINE 5.0 07/01/2018 1324   GLUCOSEU NEGATIVE 07/01/2018 1324   HGBUR MODERATE (A) 07/01/2018 1324   BILIRUBINUR NEGATIVE 07/01/2018 1324   KETONESUR 20 (A) 07/01/2018 1324   PROTEINUR 100 (A) 07/01/2018 1324   NITRITE NEGATIVE 07/01/2018 1324   LEUKOCYTESUR NEGATIVE 07/01/2018 1324     ----------------------------------------------------------------------------------------------------------------   Imaging results:   Dg Chest 2 View  Result Date: 07/01/2018 CLINICAL DATA:  Status post fall today. EXAM: CHEST - 2 VIEW COMPARISON:  CT chest 02/01/2018.  PA and lateral chest 07/22/2013. FINDINGS: Lung volumes are low but the lungs are clear. Heart size is normal. No pneumothorax or pleural fluid. No acute or focal bony abnormality. IMPRESSION: Negative chest. Electronically Signed   By: Inge Rise M.D.   On: 07/01/2018 12:10   Ct Head Wo Contrast  Result Date: 07/01/2018 CLINICAL DATA:  73 year old male with a history of fall EXAM: CT HEAD WITHOUT CONTRAST CT MAXILLOFACIAL WITHOUT CONTRAST CT CERVICAL SPINE WITHOUT CONTRAST TECHNIQUE: Multidetector CT imaging of the head, cervical spine, and maxillofacial structures were performed using the standard protocol without intravenous contrast. Multiplanar CT image reconstructions of the cervical spine and maxillofacial structures were also generated. COMPARISON:  None. FINDINGS: CT HEAD FINDINGS Brain: No acute intracranial hemorrhage. No midline shift or mass effect. Gray-white differentiation maintained. Unremarkable appearance of the ventricular system. Patchy hypodensity in the bilateral periventricular white matter. Focal hypodensity in the anterior left basal ganglia, potentially a cyst or dilated perivascular space. Vascular: Unremarkable. Skull: No acute fracture.  No aggressive bone lesion identified. Sinuses/Orbits: Unremarkable appearance of the orbits. Mastoid air cells clear. No middle ear effusion. No significant sinus disease. Other: None CT MAXILLOFACIAL FINDINGS Osseous: No fracture or mandibular dislocation. No destructive process. Orbits: Unremarkable appearance of the orbits Sinuses: Unremarkable appearance of the sinuses. Dental amalgams with significant streak artifact. Soft tissues: Unremarkable soft tissues.  No radiopaque foreign body or significant soft tissue swelling. CT CERVICAL SPINE FINDINGS Alignment: No subluxation. Skull base and vertebrae: No skull base fracture. Craniocervical junction aligned. No acute fracture identified of the vertebral elements. Facets maintain alignment. Soft tissues and spinal canal: No hematoma of the canal. Soft tissues relatively unremarkable. Minimal atherosclerosis. Disc levels: C2-C3: No significant foraminal narrowing or canal narrowing. Bilateral facet disease. C3-C4: Moderate right foraminal narrowing secondary to uncovertebral joint disease and facet disease. No significant canal narrowing or left foraminal narrowing. C4-C5: No significant foraminal narrowing or canal narrowing. Bilateral facet disease. C5-C6: Posterior disc osteophyte complex with bilateral uncovertebral joint disease, with mild foraminal narrowing bilaterally. C6-C7: Posterior disc osteophyte complex mildly narrows the canal. Bilateral uncovertebral joint disease with no significant foraminal narrowing. C7-T1: No significant foraminal narrowing or canal narrowing with disc disease and bilateral uncovertebral joint disease. Upper chest: No acute finding. Other: None IMPRESSION: Head CT: No acute intracranial abnormality. Evidence of chronic microvascular ischemic disease. Maxillofacial CT: No acute fracture. Cervical CT: No acute fracture or malalignment of the cervical spine. Degenerative disease of the cervical spine, as above. Electronically Signed   By: Corrie Mckusick D.O.   On:  07/01/2018 12:35   Ct Cervical Spine Wo Contrast  Result Date: 07/01/2018 CLINICAL DATA:  73 year old male with a history of fall EXAM: CT HEAD WITHOUT CONTRAST CT MAXILLOFACIAL WITHOUT CONTRAST CT CERVICAL SPINE WITHOUT CONTRAST TECHNIQUE: Multidetector CT imaging of the head, cervical spine, and maxillofacial structures were performed using the standard protocol without intravenous contrast. Multiplanar CT image  reconstructions of the cervical spine and maxillofacial structures were also generated. COMPARISON:  None. FINDINGS: CT HEAD FINDINGS Brain: No acute intracranial hemorrhage. No midline shift or mass effect. Gray-white differentiation maintained. Unremarkable appearance of the ventricular system. Patchy hypodensity in the bilateral periventricular white matter. Focal hypodensity in the anterior left basal ganglia, potentially a cyst or dilated perivascular space. Vascular: Unremarkable. Skull: No acute fracture.  No aggressive bone lesion identified. Sinuses/Orbits: Unremarkable appearance of the orbits. Mastoid air cells clear. No middle ear effusion. No significant sinus disease. Other: None CT MAXILLOFACIAL FINDINGS Osseous: No fracture or mandibular dislocation. No destructive process. Orbits: Unremarkable appearance of the orbits Sinuses: Unremarkable appearance of the sinuses. Dental amalgams with significant streak artifact. Soft tissues: Unremarkable soft tissues. No radiopaque foreign body or significant soft tissue swelling. CT CERVICAL SPINE FINDINGS Alignment: No subluxation. Skull base and vertebrae: No skull base fracture. Craniocervical junction aligned. No acute fracture identified of the vertebral elements. Facets maintain alignment. Soft tissues and spinal canal: No hematoma of the canal. Soft tissues relatively unremarkable. Minimal atherosclerosis. Disc levels: C2-C3: No significant foraminal narrowing or canal narrowing. Bilateral facet disease. C3-C4: Moderate right foraminal narrowing secondary to uncovertebral joint disease and facet disease. No significant canal narrowing or left foraminal narrowing. C4-C5: No significant foraminal narrowing or canal narrowing. Bilateral facet disease. C5-C6: Posterior disc osteophyte complex with bilateral uncovertebral joint disease, with mild foraminal narrowing bilaterally. C6-C7: Posterior disc osteophyte complex mildly narrows the canal. Bilateral  uncovertebral joint disease with no significant foraminal narrowing. C7-T1: No significant foraminal narrowing or canal narrowing with disc disease and bilateral uncovertebral joint disease. Upper chest: No acute finding. Other: None IMPRESSION: Head CT: No acute intracranial abnormality. Evidence of chronic microvascular ischemic disease. Maxillofacial CT: No acute fracture. Cervical CT: No acute fracture or malalignment of the cervical spine. Degenerative disease of the cervical spine, as above. Electronically Signed   By: Corrie Mckusick D.O.   On: 07/01/2018 12:35   Dg Hand Complete Left  Result Date: 07/01/2018 CLINICAL DATA:  Left hand injury in a fall today. Initial encounter. EXAM: LEFT HAND - COMPLETE 3+ VIEW COMPARISON:  None. FINDINGS: There is no acute bony or joint abnormality. Joint spaces are preserved. Soft tissues appear normal. IMPRESSION: Negative exam. Electronically Signed   By: Inge Rise M.D.   On: 07/01/2018 12:12   Dg Hand Complete Right  Result Date: 07/01/2018 CLINICAL DATA:  Right hand injury in a fall today. Initial encounter. EXAM: RIGHT HAND - COMPLETE 3+ VIEW COMPARISON:  None. FINDINGS: There is no acute bony or joint abnormality. No focal bony lesion. Joint spaces are preserved. Soft tissues appear normal. IMPRESSION: Negative exam. Electronically Signed   By: Inge Rise M.D.   On: 07/01/2018 12:11   Ct Maxillofacial Wo Cm  Result Date: 07/01/2018 CLINICAL DATA:  73 year old male with a history of fall EXAM: CT HEAD WITHOUT CONTRAST CT MAXILLOFACIAL WITHOUT CONTRAST CT CERVICAL SPINE WITHOUT CONTRAST TECHNIQUE: Multidetector CT imaging of the head, cervical spine, and maxillofacial structures were performed using the standard protocol without intravenous contrast. Multiplanar CT image reconstructions of the cervical spine and maxillofacial structures were  also generated. COMPARISON:  None. FINDINGS: CT HEAD FINDINGS Brain: No acute intracranial hemorrhage. No  midline shift or mass effect. Gray-white differentiation maintained. Unremarkable appearance of the ventricular system. Patchy hypodensity in the bilateral periventricular white matter. Focal hypodensity in the anterior left basal ganglia, potentially a cyst or dilated perivascular space. Vascular: Unremarkable. Skull: No acute fracture.  No aggressive bone lesion identified. Sinuses/Orbits: Unremarkable appearance of the orbits. Mastoid air cells clear. No middle ear effusion. No significant sinus disease. Other: None CT MAXILLOFACIAL FINDINGS Osseous: No fracture or mandibular dislocation. No destructive process. Orbits: Unremarkable appearance of the orbits Sinuses: Unremarkable appearance of the sinuses. Dental amalgams with significant streak artifact. Soft tissues: Unremarkable soft tissues. No radiopaque foreign body or significant soft tissue swelling. CT CERVICAL SPINE FINDINGS Alignment: No subluxation. Skull base and vertebrae: No skull base fracture. Craniocervical junction aligned. No acute fracture identified of the vertebral elements. Facets maintain alignment. Soft tissues and spinal canal: No hematoma of the canal. Soft tissues relatively unremarkable. Minimal atherosclerosis. Disc levels: C2-C3: No significant foraminal narrowing or canal narrowing. Bilateral facet disease. C3-C4: Moderate right foraminal narrowing secondary to uncovertebral joint disease and facet disease. No significant canal narrowing or left foraminal narrowing. C4-C5: No significant foraminal narrowing or canal narrowing. Bilateral facet disease. C5-C6: Posterior disc osteophyte complex with bilateral uncovertebral joint disease, with mild foraminal narrowing bilaterally. C6-C7: Posterior disc osteophyte complex mildly narrows the canal. Bilateral uncovertebral joint disease with no significant foraminal narrowing. C7-T1: No significant foraminal narrowing or canal narrowing with disc disease and bilateral uncovertebral joint  disease. Upper chest: No acute finding. Other: None IMPRESSION: Head CT: No acute intracranial abnormality. Evidence of chronic microvascular ischemic disease. Maxillofacial CT: No acute fracture. Cervical CT: No acute fracture or malalignment of the cervical spine. Degenerative disease of the cervical spine, as above. Electronically Signed   By: Corrie Mckusick D.O.   On: 07/01/2018 12:35    My personal review of EKG:  Assessment & Plan  Acute kidney injury Probably from decreased p.o. intake Slow hydration  Diabetes mellitus type 2 Hold Glucophage/ISS Neurontin on hold  poor p.o. intake with acute weakness and debility  ?  UTI versus viral illness Start Levaquin with hydration status post fall with negative work-up in the emergency room including CT scan and x-rays  Hypertension Uncontrolled, benazepril/HCTZ on hold due to renal insufficiency Add hydralazine as needed due to bradycardia  DVT Prophylaxis Lovenox  AM Labs Ordered, also please review Full Orders    Code Status full  Disposition Plan: Home  Time spent in minutes : 43 minutes  Condition GUARDED   @SIGNATURE @

## 2018-07-01 NOTE — ED Provider Notes (Signed)
[facility] EMERGENCY DEPARTMENT Provider Note   CSN: [id number] Arrival date & time: 07/01/18  1106    History   Chief Complaint Chief Complaint  Patient presents with   Weakness    HPI SAMAAD HASHEM is a 73 y.o. male with history of anxiety, chronic renal insufficiency, diabetes mellitus, gout, hyperlipidemia, hypertension presents for evaluation of acute onset, progressively worsening generalized weakness for approximately 1 week.  He endorses decreased appetite and decreased oral intake.  He states that at around 3 AM this morning he awoke and got up from the couch to go to his wife's bedroom when he fell.  He does not remember how he fell or if there was any prodrome leading up to the fall.  He is unsure if he lost consciousness but there are abrasions and ecchymosis noted to the forehead and dorsum of the nose.  He is not currently anticoagulated.  He is complaining of pain to the dorsum of his hands bilaterally mostly in his fingers.  Denies numbness, vision changes, difficulty swallowing, headaches, chest pain, shortness of breath, abdominal pain, nausea, vomiting, urinary symptoms, diarrhea, constipation, melena, or hematochezia.  He does not think that he has had any fevers.  When asked about a cough he states "no more than usual ".  No recent travel.  Chart review shows that the patient's wife called his PCP at [facility] stating that he has had a cough this week with no fever but has been progressively weaker since last weekend.  Apparently there was concern that he was somewhat disoriented.  He denies any new medications.     The history is provided by the patient.    Past Medical History:  Diagnosis Date   Anxiety    Chronic renal insufficiency    Diabetes mellitus without complication ([place])    Gout    Hyperlipidemia    Hypertension    Pneumonia    hx    Patient Active Problem List   Diagnosis Date Noted   AKI (acute kidney injury)  ([place]) 07/01/2018   HNP (herniated nucleus pulposus), lumbar 05/04/2013    Past Surgical History:  Procedure Laterality Date   APPENDECTOMY     ELBOW ARTHROSCOPY Right    torn cartilage   KNEE ARTHROSCOPY Left    cartilage   KNEE ARTHROSCOPY Right 07/22/2013   Procedure: ARTHROSCOPY KNEE;  Surgeon: Augustin Schooling, MD;  Location: [place];  Service: Orthopedics;  Laterality: Right;   LUMBAR LAMINECTOMY/DECOMPRESSION MICRODISCECTOMY N/A 05/04/2013   Procedure: Right Lumbar Three-Four laminectomy and microdiskectomy ;  Surgeon: Hosie Spangle, MD;  Location: [facility];  Service: Neurosurgery;  Laterality: N/A;  Right Lumbar Three-Four laminectomy and microdiskectomy         Home Medications    Prior to Admission medications   Medication Sig Start Date End Date Taking? Authorizing Provider  acetaminophen (TYLENOL) 650 MG CR tablet Take 650 mg by mouth every 8 (eight) hours as needed for pain.   Yes [provider]  allopurinol (ZYLOPRIM) 100 MG tablet Take 100 mg by mouth 2 (two) times daily. 04/19/13  Yes [provider]  ALPRAZolam (XANAX) 0.25 MG tablet Take 0.25-0.5 mg by mouth at bedtime as needed for sleep.    Yes [provider]  aspirin EC 81 MG tablet Take 81 mg by mouth daily.   Yes [provider]  atenolol (TENORMIN) 100 MG tablet Take 100 mg by mouth daily.   Yes [provider]  benazepril-hydrochlorthiazide (LOTENSIN HCT) 20-12.5 MG per tablet Take 1 tablet by mouth daily.   Yes [provider]  cetirizine (ZYRTEC) 10 MG tablet Take 10 mg by mouth daily.   Yes [provider]  famotidine (PEPCID) 20 MG tablet Take 20 mg by mouth daily.   Yes [provider]  gabapentin (NEURONTIN) 600 MG tablet Take 600 mg by mouth 2 (two) times daily.    Yes [provider]  HYDROcodone-acetaminophen (NORCO/VICODIN) 5-325 MG tablet Take 2 tablets by mouth 2 (two) times daily as needed (for back or neck  pain).   Yes [provider]  metFORMIN (GLUCOPHAGE) 1000 MG tablet Take 1,000 mg by mouth 2 (two) times daily with a meal.   Yes [provider]  Multiple Vitamin (MULTIVITAMIN) capsule Take 1 capsule by mouth daily.   Yes [provider]  Multiple Vitamins-Minerals (PRESERVISION AREDS 2 PO) Take 1 capsule by mouth 2 (two) times daily.   Yes [provider]  Omega-3 Fatty Acids (FISH OIL) 1000 MG CAPS Take 1,000-2,000 mg by mouth 3 (three) times daily.    Yes [provider]  promethazine (PHENERGAN) 12.5 MG tablet Take 12.5 mg by mouth every 8 (eight) hours as needed for nausea or vomiting.  01/27/18  Yes [provider]  tiZANidine (ZANAFLEX) 4 MG tablet Take 4 mg by mouth every 6 (six) hours as needed for muscle spasms.  02/09/18  Yes [provider]  triamcinolone cream (KENALOG) 0.1 % Apply 1 application topically as needed (for rashes or irritation).  09/21/17  Yes [provider]    Family History Family History  Problem Relation Age of Onset   Dementia Mother    Lung disease Father     Social History Social History   Tobacco Use   Smoking status: Current Every Day Smoker    Packs/day: 1.50    Years: 50.00    Pack years: 75.00    Types: Cigarettes   Smokeless tobacco: Former Systems developer    Quit date: 02/23/1988  Substance Use Topics   Alcohol use: Yes    Comment: Daily; 3-4 drinks per day   Drug use: No     Allergies   Adhesive [tape]   Review of Systems Review of Systems  Constitutional: Negative for chills and fever.  Eyes: Negative for visual disturbance.  Respiratory: Positive for cough. Negative for shortness of breath.   Cardiovascular: Negative for chest pain.  Gastrointestinal: Negative for abdominal pain, diarrhea, nausea and vomiting.  Musculoskeletal: Positive for arthralgias.  Neurological: Positive for weakness (generalized). Negative for headaches.  All other systems reviewed and  are negative.    Physical Exam Updated Vital Signs BP (!) 172/76    Pulse (!) 53    Temp 98.4 F (36.9 C) (Oral)    Resp 17    SpO2 100%   Physical Exam Vitals signs and nursing note reviewed.  Constitutional:      General: He is not in acute distress.    Appearance: He is well-developed.  HENT:     Head: Normocephalic.     Comments: Ecchymosis noted to the forehead and the dorsum of the nose.  No raccoon eyes, rhinorrhea, hemotympanum, or battle signs noted.  No tenderness to palpation of the face or skull otherwise.  Dentition appears stable.    Mouth/Throat:     Mouth: Mucous membranes are dry.  Eyes:     General:        Right eye: No discharge.  Left eye: No discharge.     Extraocular Movements: Extraocular movements intact.     Conjunctiva/sclera: Conjunctivae normal.     Pupils: Pupils are equal, round, and reactive to light.  Neck:     Vascular: No JVD.     Trachea: No tracheal deviation.     Comments: There is midline cervical spine tenderness on examination, nonfocal.  No deformity, crepitus, or step-off noted.  C-collar was applied. Cardiovascular:     Rate and Rhythm: Regular rhythm. Bradycardia present.     Pulses: Normal pulses.     Comments: 2+ radial and DP/PT pulses bilaterally, no lower extremity edema Pulmonary:     Effort: Pulmonary effort is normal.     Breath sounds: Normal breath sounds.  Chest:     Chest wall: No tenderness.  Abdominal:     General: Abdomen is flat. There is no distension.     Tenderness: There is no guarding or rebound.  Musculoskeletal:     Comments: Moves extremities continuously.  4+/5 strength of bilateral hip flexors and deltoids but otherwise 5/5 strength of BUE and BLE major muscle groups.  Somewhat limited range of motion with flexion of the knees bilaterally but patient states this is chronic and unchanged.  No midline thoracic or lumbar spine tenderness.  No deformity, crepitus, or step-off noted.  Skin:    General:  Skin is warm and dry.     Findings: No erythema.  Neurological:     Mental Status: He is alert.     Comments: Mental Status:  Alert, thought content appropriate, able to give a mostly coherent history. Speech fluent without evidence of aphasia. Able to follow 2 step commands without difficulty.  Oriented to person place and month but not day of the week. Cranial Nerves:  II:  Peripheral visual fields grossly normal, pupils equal, round, reactive to light III,IV, VI: ptosis not present, extra-ocular motions intact bilaterally  V,VII: smile symmetric, facial light touch sensation equal VIII: hearing grossly normal to voice  X: uvula elevates symmetrically  XI: bilateral shoulder shrug symmetric and strong XII: midline tongue extension without fassiculations Motor:  Normal tone.  No pronator drift. Sensory: light touch normal in all extremities.    Psychiatric:        Behavior: Behavior normal.      ED Treatments / Results  Labs (all labs ordered are listed, but only abnormal results are displayed) Labs Reviewed  COMPREHENSIVE METABOLIC PANEL - Abnormal; Notable for the following components:      Result Value   Glucose, Bld 103 (*)    BUN 26 (*)    Creatinine, Ser 2.37 (*)    Total Protein 6.3 (*)    GFR calc non Af Amer 26 (*)    GFR calc Af Amer 30 (*)    All other components within normal limits  URINALYSIS, ROUTINE W REFLEX MICROSCOPIC - Abnormal; Notable for the following components:   Color, Urine AMBER (*)    APPearance HAZY (*)    Hgb urine dipstick MODERATE (*)    Ketones, ur 20 (*)    Protein, ur 100 (*)    Bacteria, UA RARE (*)    All other components within normal limits  URINE CULTURE  CBC WITH DIFFERENTIAL/PLATELET  TROPONIN I    EKG EKG Interpretation  Date/Time:  Thursday July 01 2018 12:20:17 EDT Ventricular Rate:  56 PR Interval:    QRS Duration: 110 QT Interval:  464 QTC Calculation: 448 R Axis:   29  Text Interpretation:  Sinus rhythm  Prolonged PR interval Probable left atrial enlargement Borderline low voltage, extremity leads Abnormal R-wave progression, late transition Since last tracing rate slower Confirmed by Isla Pence [phone]) on 07/01/2018 12:32:36 PM Also confirmed by Isla Pence [phone]), editor Philomena Doheny [phone])  on 07/01/2018 12:55:37 PM   Radiology Dg Chest 2 View  Result Date: 07/01/2018 CLINICAL DATA:  Status post fall today. EXAM: CHEST - 2 VIEW COMPARISON:  CT chest 02/01/2018.  PA and lateral chest 07/22/2013. FINDINGS: Lung volumes are low but the lungs are clear. Heart size is normal. No pneumothorax or pleural fluid. No acute or focal bony abnormality. IMPRESSION: Negative chest. Electronically Signed   By: Inge Rise M.D.   On: 07/01/2018 12:10   Ct Head Wo Contrast  Result Date: 07/01/2018 CLINICAL DATA:  73 year old male with a history of fall EXAM: CT HEAD WITHOUT CONTRAST CT MAXILLOFACIAL WITHOUT CONTRAST CT CERVICAL SPINE WITHOUT CONTRAST TECHNIQUE: Multidetector CT imaging of the head, cervical spine, and maxillofacial structures were performed using the standard protocol without intravenous contrast. Multiplanar CT image reconstructions of the cervical spine and maxillofacial structures were also generated. COMPARISON:  None. FINDINGS: CT HEAD FINDINGS Brain: No acute intracranial hemorrhage. No midline shift or mass effect. Gray-white differentiation maintained. Unremarkable appearance of the ventricular system. Patchy hypodensity in the bilateral periventricular white matter. Focal hypodensity in the anterior left basal ganglia, potentially a cyst or dilated perivascular space. Vascular: Unremarkable. Skull: No acute fracture.  No aggressive bone lesion identified. Sinuses/Orbits: Unremarkable appearance of the orbits. Mastoid air cells clear. No middle ear effusion. No significant sinus disease. Other: None CT MAXILLOFACIAL FINDINGS Osseous: No fracture or mandibular dislocation. No  destructive process. Orbits: Unremarkable appearance of the orbits Sinuses: Unremarkable appearance of the sinuses. Dental amalgams with significant streak artifact. Soft tissues: Unremarkable soft tissues. No radiopaque foreign body or significant soft tissue swelling. CT CERVICAL SPINE FINDINGS Alignment: No subluxation. Skull base and vertebrae: No skull base fracture. Craniocervical junction aligned. No acute fracture identified of the vertebral elements. Facets maintain alignment. Soft tissues and spinal canal: No hematoma of the canal. Soft tissues relatively unremarkable. Minimal atherosclerosis. Disc levels: C2-C3: No significant foraminal narrowing or canal narrowing. Bilateral facet disease. C3-C4: Moderate right foraminal narrowing secondary to uncovertebral joint disease and facet disease. No significant canal narrowing or left foraminal narrowing. C4-C5: No significant foraminal narrowing or canal narrowing. Bilateral facet disease. C5-C6: Posterior disc osteophyte complex with bilateral uncovertebral joint disease, with mild foraminal narrowing bilaterally. C6-C7: Posterior disc osteophyte complex mildly narrows the canal. Bilateral uncovertebral joint disease with no significant foraminal narrowing. C7-T1: No significant foraminal narrowing or canal narrowing with disc disease and bilateral uncovertebral joint disease. Upper chest: No acute finding. Other: None IMPRESSION: Head CT: No acute intracranial abnormality. Evidence of chronic microvascular ischemic disease. Maxillofacial CT: No acute fracture. Cervical CT: No acute fracture or malalignment of the cervical spine. Degenerative disease of the cervical spine, as above. Electronically Signed   By: Corrie Mckusick D.O.   On: 07/01/2018 12:35   Ct Cervical Spine Wo Contrast  Result Date: 07/01/2018 CLINICAL DATA:  73 year old male with a history of fall EXAM: CT HEAD WITHOUT CONTRAST CT MAXILLOFACIAL WITHOUT CONTRAST CT CERVICAL SPINE WITHOUT  CONTRAST TECHNIQUE: Multidetector CT imaging of the head, cervical spine, and maxillofacial structures were performed using the standard protocol without intravenous contrast. Multiplanar CT image reconstructions of the cervical spine and maxillofacial structures were also generated. COMPARISON:  None. FINDINGS: CT HEAD FINDINGS Brain:  No acute intracranial hemorrhage. No midline shift or mass effect. Gray-white differentiation maintained. Unremarkable appearance of the ventricular system. Patchy hypodensity in the bilateral periventricular white matter. Focal hypodensity in the anterior left basal ganglia, potentially a cyst or dilated perivascular space. Vascular: Unremarkable. Skull: No acute fracture.  No aggressive bone lesion identified. Sinuses/Orbits: Unremarkable appearance of the orbits. Mastoid air cells clear. No middle ear effusion. No significant sinus disease. Other: None CT MAXILLOFACIAL FINDINGS Osseous: No fracture or mandibular dislocation. No destructive process. Orbits: Unremarkable appearance of the orbits Sinuses: Unremarkable appearance of the sinuses. Dental amalgams with significant streak artifact. Soft tissues: Unremarkable soft tissues. No radiopaque foreign body or significant soft tissue swelling. CT CERVICAL SPINE FINDINGS Alignment: No subluxation. Skull base and vertebrae: No skull base fracture. Craniocervical junction aligned. No acute fracture identified of the vertebral elements. Facets maintain alignment. Soft tissues and spinal canal: No hematoma of the canal. Soft tissues relatively unremarkable. Minimal atherosclerosis. Disc levels: C2-C3: No significant foraminal narrowing or canal narrowing. Bilateral facet disease. C3-C4: Moderate right foraminal narrowing secondary to uncovertebral joint disease and facet disease. No significant canal narrowing or left foraminal narrowing. C4-C5: No significant foraminal narrowing or canal narrowing. Bilateral facet disease. C5-C6:  Posterior disc osteophyte complex with bilateral uncovertebral joint disease, with mild foraminal narrowing bilaterally. C6-C7: Posterior disc osteophyte complex mildly narrows the canal. Bilateral uncovertebral joint disease with no significant foraminal narrowing. C7-T1: No significant foraminal narrowing or canal narrowing with disc disease and bilateral uncovertebral joint disease. Upper chest: No acute finding. Other: None IMPRESSION: Head CT: No acute intracranial abnormality. Evidence of chronic microvascular ischemic disease. Maxillofacial CT: No acute fracture. Cervical CT: No acute fracture or malalignment of the cervical spine. Degenerative disease of the cervical spine, as above. Electronically Signed   By: Corrie Mckusick D.O.   On: 07/01/2018 12:35   Dg Hand Complete Left  Result Date: 07/01/2018 CLINICAL DATA:  Left hand injury in a fall today. Initial encounter. EXAM: LEFT HAND - COMPLETE 3+ VIEW COMPARISON:  None. FINDINGS: There is no acute bony or joint abnormality. Joint spaces are preserved. Soft tissues appear normal. IMPRESSION: Negative exam. Electronically Signed   By: Inge Rise M.D.   On: 07/01/2018 12:12   Dg Hand Complete Right  Result Date: 07/01/2018 CLINICAL DATA:  Right hand injury in a fall today. Initial encounter. EXAM: RIGHT HAND - COMPLETE 3+ VIEW COMPARISON:  None. FINDINGS: There is no acute bony or joint abnormality. No focal bony lesion. Joint spaces are preserved. Soft tissues appear normal. IMPRESSION: Negative exam. Electronically Signed   By: Inge Rise M.D.   On: 07/01/2018 12:11   Ct Maxillofacial Wo Cm  Result Date: 07/01/2018 CLINICAL DATA:  73 year old male with a history of fall EXAM: CT HEAD WITHOUT CONTRAST CT MAXILLOFACIAL WITHOUT CONTRAST CT CERVICAL SPINE WITHOUT CONTRAST TECHNIQUE: Multidetector CT imaging of the head, cervical spine, and maxillofacial structures were performed using the standard protocol without intravenous contrast.  Multiplanar CT image reconstructions of the cervical spine and maxillofacial structures were also generated. COMPARISON:  None. FINDINGS: CT HEAD FINDINGS Brain: No acute intracranial hemorrhage. No midline shift or mass effect. Gray-white differentiation maintained. Unremarkable appearance of the ventricular system. Patchy hypodensity in the bilateral periventricular white matter. Focal hypodensity in the anterior left basal ganglia, potentially a cyst or dilated perivascular space. Vascular: Unremarkable. Skull: No acute fracture.  No aggressive bone lesion identified. Sinuses/Orbits: Unremarkable appearance of the orbits. Mastoid air cells clear. No middle ear effusion. No significant  sinus disease. Other: None CT MAXILLOFACIAL FINDINGS Osseous: No fracture or mandibular dislocation. No destructive process. Orbits: Unremarkable appearance of the orbits Sinuses: Unremarkable appearance of the sinuses. Dental amalgams with significant streak artifact. Soft tissues: Unremarkable soft tissues. No radiopaque foreign body or significant soft tissue swelling. CT CERVICAL SPINE FINDINGS Alignment: No subluxation. Skull base and vertebrae: No skull base fracture. Craniocervical junction aligned. No acute fracture identified of the vertebral elements. Facets maintain alignment. Soft tissues and spinal canal: No hematoma of the canal. Soft tissues relatively unremarkable. Minimal atherosclerosis. Disc levels: C2-C3: No significant foraminal narrowing or canal narrowing. Bilateral facet disease. C3-C4: Moderate right foraminal narrowing secondary to uncovertebral joint disease and facet disease. No significant canal narrowing or left foraminal narrowing. C4-C5: No significant foraminal narrowing or canal narrowing. Bilateral facet disease. C5-C6: Posterior disc osteophyte complex with bilateral uncovertebral joint disease, with mild foraminal narrowing bilaterally. C6-C7: Posterior disc osteophyte complex mildly narrows the  canal. Bilateral uncovertebral joint disease with no significant foraminal narrowing. C7-T1: No significant foraminal narrowing or canal narrowing with disc disease and bilateral uncovertebral joint disease. Upper chest: No acute finding. Other: None IMPRESSION: Head CT: No acute intracranial abnormality. Evidence of chronic microvascular ischemic disease. Maxillofacial CT: No acute fracture. Cervical CT: No acute fracture or malalignment of the cervical spine. Degenerative disease of the cervical spine, as above. Electronically Signed   By: Corrie Mckusick D.O.   On: 07/01/2018 12:35    Procedures Procedures (including critical care time)  Medications Ordered in ED Medications  levofloxacin (LEVAQUIN) IVPB 750 mg (has no administration in time range)  fentaNYL (SUBLIMAZE) injection 100 mcg (50 mcg Intravenous Given 07/01/18 1135)  sodium chloride 0.9 % bolus 500 mL (0 mLs Intravenous Stopped 07/01/18 1300)  sodium chloride 0.9 % bolus 500 mL (0 mLs Intravenous Stopped 07/01/18 1440)     Initial Impression / Assessment and Plan / ED Course  I have reviewed the triage vital signs and the nursing notes.  Pertinent labs & imaging results that were available during my care of the patient were reviewed by me and considered in my medical decision making (see chart for details).        Patient presenting for evaluation of progressively worsening generalized weakness for the last week.  He is afebrile, bradycardic and hypertensive while in the ED.  He is clinically dry.  He did also sustain a fall at around 3 AM today.  He is unsure if there was any prodrome leading up to the fall or if he passed out.  No focal neurologic deficits on examination.  He does have some midline cervical spine tenderness so we will apply a c-collar and obtain imaging and lab work and reassess.  Imaging with no evidence of acute intracranial abnormality, SAH, ICH, CVA, or cervical spine injury.  No facial fractures on imaging  noted.  Chest x-ray shows no acute cardiopulmonary abnormalities with no effusion or pneumonia.  EKG shows sinus bradycardia, no ST segment abnormality or arrhythmias.  Lab work reviewed by me shows elevated creatinine of 2.37 and BUN of 26.  This appears increased from a baseline of around 1.4 per chart review using care everywhere.  Suspect he has an AKI secondary to dehydration from decreased oral intake.  He may have been orthostatic and fallen after standing up too quickly this morning. UA equivocal for UTI.  Troponin is negative and I doubt ACS/MI.  Spoke with Dr. Laren Everts with [facility] hospitalist service who agrees to assume care of patient  and bring him into the hospital for further evaluation and management.  He recommends initiating Levaquin for treatment of possible UTI.  We will culture his urine as well.  I spoke with the patient's wife on the phone with his permission and she is agreeable to admission.  Patient seen and evaluated by Dr. Gilford Raid who agrees with assessment and plan at this time. Final Clinical Impressions(s) / ED Diagnoses   Final diagnoses:  AKI (acute kidney injury) [facility])  Dehydration  Bradycardia    ED Discharge Orders    None       Debroah Baller 07/01/18 1553    Isla Pence, MD 07/02/18 [phone]

## 2018-07-01 NOTE — ED Notes (Signed)
Patient has returned back to room.

## 2018-07-01 NOTE — ED Notes (Signed)
Patient was having testing done not in room at this time.

## 2018-07-01 NOTE — ED Notes (Signed)
Called [facility] to give report.  Receiving RN unable to take report at this time. 

## 2018-07-01 NOTE — ED Notes (Signed)
ED TO INPATIENT HANDOFF REPORT  ED Nurse Name and Phone #:  Chester Holstein [phone]  S Name/Age/Gender Mitchell Hunter 73 y.o. male Room/Bed: 036C/036C  Code Status   Code Status: Prior  Home/SNF/Other Home Patient oriented to: self, place, time and situation Is this baseline? Yes   Triage Complete: Triage complete  Chief Complaint Weakness x2 days  Triage Note Per [facility], pt from home with complaint of generalized weakness x 1 week. This morning pt went to get up and his legs gave out causing him to fall face first on the floor. Hit forehead and has left wrist pain. No syncope.    Allergies Allergies  Allergen Reactions  . Adhesive [Tape] Other (See Comments)    Level of Care/Admitting Diagnosis ED Disposition    ED Disposition Condition Comment   Admit  Hospital Area: [facility] [[id number]]  Level of Care: Med-Surg [16]  Diagnosis: AKI (acute kidney injury) [name]) [[id number]]  Admitting Physician: Merton Border Marshal.Browner  Attending Physician: Laren Everts, [place]  Estimated length of stay: past midnight tomorrow  Certification:: I certify this patient will need inpatient services for at least 2 midnights  PT Class (Do Not Modify): Inpatient [101]  PT Acc Code (Do Not Modify): Private [1]       B Medical/Surgery History Past Medical History:  Diagnosis Date  . Anxiety   . Chronic renal insufficiency   . Diabetes mellitus without complication ([name])   . Gout   . Hyperlipidemia   . Hypertension   . Pneumonia    hx   Past Surgical History:  Procedure Laterality Date  . APPENDECTOMY    . ELBOW ARTHROSCOPY Right    torn cartilage  . KNEE ARTHROSCOPY Left    cartilage  . KNEE ARTHROSCOPY Right 07/22/2013   Procedure: ARTHROSCOPY KNEE;  Surgeon: Augustin Schooling, MD;  Location: [facility];  Service: Orthopedics;  Laterality: Right;  . LUMBAR LAMINECTOMY/DECOMPRESSION MICRODISCECTOMY N/A 05/04/2013   Procedure: Right Lumbar Three-Four laminectomy and microdiskectomy  ;  Surgeon: Hosie Spangle, MD;  Location: [facility];  Service: Neurosurgery;  Laterality: N/A;  Right Lumbar Three-Four laminectomy and microdiskectomy      A IV Location/Drains/Wounds Patient Lines/Drains/Airways Status   Active Line/Drains/Airways    Name:   Placement date:   Placement time:   Site:   Days:   Peripheral IV 07/01/18 Left Antecubital   07/01/18    -    Antecubital   less than 1          Intake/Output Last 24 hours  Intake/Output Summary (Last 24 hours) at 07/01/2018 1519 Last data filed at 07/01/2018 1440 Gross per 24 hour  Intake 1000 ml  Output -  Net 1000 ml    Labs/Imaging Results for orders placed or performed during the hospital encounter of 07/01/18 (from the past 48 hour(s))  Comprehensive metabolic panel     Status: Abnormal   Collection Time: 07/01/18 11:26 AM  Result Value Ref Range   Sodium 136 135 - 145 mmol/L   Potassium 3.8 3.5 - 5.1 mmol/L   Chloride 99 98 - 111 mmol/L   CO2 22 22 - 32 mmol/L   Glucose, Bld 103 (H) 70 - 99 mg/dL   BUN 26 (H) 8 - 23 mg/dL   Creatinine, Ser 2.37 (H) 0.61 - 1.24 mg/dL   Calcium 9.1 8.9 - 10.3 mg/dL   Total Protein 6.3 (L) 6.5 - 8.1 g/dL   Albumin 3.5 3.5 - 5.0 g/dL  AST 25 15 - 41 U/L   ALT 18 0 - 44 U/L   Alkaline Phosphatase 53 38 - 126 U/L   Total Bilirubin 1.1 0.3 - 1.2 mg/dL   GFR calc non Af Amer 26 (L) >60 mL/min   GFR calc Af Amer 30 (L) >60 mL/min   Anion gap 15 5 - 15    Comment: Performed at [facility] [address], [place], [place] [address]  CBC with Differential     Status: None   Collection Time: 07/01/18 11:26 AM  Result Value Ref Range   WBC 6.8 4.0 - 10.5 K/uL   RBC 4.23 4.22 - 5.81 MIL/uL   Hemoglobin 13.2 13.0 - 17.0 g/dL   HCT 40.8 39.0 - 52.0 %   MCV 96.5 80.0 - 100.0 fL   MCH 31.2 26.0 - 34.0 pg   MCHC 32.4 30.0 - 36.0 g/dL   RDW 13.9 11.5 - 15.5 %   Platelets 157 150 - 400 K/uL   nRBC 0.0 0.0 - 0.2 %   Neutrophils Relative % 64 %   Neutro Abs 4.4 1.7 -  7.7 K/uL   Lymphocytes Relative 26 %   Lymphs Abs 1.7 0.7 - 4.0 K/uL   Monocytes Relative 7 %   Monocytes Absolute 0.5 0.1 - 1.0 K/uL   Eosinophils Relative 3 %   Eosinophils Absolute 0.2 0.0 - 0.5 K/uL   Basophils Relative 0 %   Basophils Absolute 0.0 0.0 - 0.1 K/uL   Immature Granulocytes 0 %   Abs Immature Granulocytes 0.02 0.00 - 0.07 K/uL    Comment: Performed at [name], [address] [place], [place] [address]  Troponin I - Once     Status: None   Collection Time: 07/01/18 11:26 AM  Result Value Ref Range   Troponin I <0.03 <0.03 ng/mL    Comment: Performed at [name] [address], [place], [place] [address]  Urinalysis, Routine w reflex microscopic     Status: Abnormal   Collection Time: 07/01/18  1:24 PM  Result Value Ref Range   Color, Urine AMBER (A) YELLOW    Comment: BIOCHEMICALS MAY BE AFFECTED BY COLOR   APPearance HAZY (A) CLEAR   Specific Gravity, Urine 1.024 1.005 - 1.030   pH 5.0 5.0 - 8.0   Glucose, UA NEGATIVE NEGATIVE mg/dL   Hgb urine dipstick MODERATE (A) NEGATIVE   Bilirubin Urine NEGATIVE NEGATIVE   Ketones, ur 20 (A) NEGATIVE mg/dL   Protein, ur 100 (A) NEGATIVE mg/dL   Nitrite NEGATIVE NEGATIVE   Leukocytes,Ua NEGATIVE NEGATIVE   RBC / HPF 11-20 0 - 5 RBC/hpf   WBC, UA 6-10 0 - 5 WBC/hpf   Bacteria, UA RARE (A) NONE SEEN   Squamous Epithelial / LPF 0-5 0 - 5   Mucus PRESENT    Hyaline Casts, UA PRESENT     Comment: Performed at [name], [address], [place], [place] [address]   Dg Chest 2 View  Result Date: 07/01/2018 CLINICAL DATA:  Status post fall today. EXAM: CHEST - 2 VIEW COMPARISON:  CT chest 02/01/2018.  PA and lateral chest 07/22/2013. FINDINGS: Lung volumes are low but the lungs are clear. Heart size is normal. No pneumothorax or pleural fluid. No acute or focal bony abnormality. IMPRESSION: Negative chest. Electronically Signed   By: Inge Rise M.D.   On: 07/01/2018 12:10   Ct Head Wo  Contrast  Result Date: 07/01/2018  CLINICAL DATA:  73 year old male with a history of fall EXAM: CT HEAD WITHOUT CONTRAST CT MAXILLOFACIAL WITHOUT CONTRAST CT CERVICAL SPINE WITHOUT CONTRAST TECHNIQUE: Multidetector CT imaging of the head, cervical spine, and maxillofacial structures were performed using the standard protocol without intravenous contrast. Multiplanar CT image reconstructions of the cervical spine and maxillofacial structures were also generated. COMPARISON:  None. FINDINGS: CT HEAD FINDINGS Brain: No acute intracranial hemorrhage. No midline shift or mass effect. Gray-white differentiation maintained. Unremarkable appearance of the ventricular system. Patchy hypodensity in the bilateral periventricular white matter. Focal hypodensity in the anterior left basal ganglia, potentially a cyst or dilated perivascular space. Vascular: Unremarkable. Skull: No acute fracture.  No aggressive bone lesion identified. Sinuses/Orbits: Unremarkable appearance of the orbits. Mastoid air cells clear. No middle ear effusion. No significant sinus disease. Other: None CT MAXILLOFACIAL FINDINGS Osseous: No fracture or mandibular dislocation. No destructive process. Orbits: Unremarkable appearance of the orbits Sinuses: Unremarkable appearance of the sinuses. Dental amalgams with significant streak artifact. Soft tissues: Unremarkable soft tissues. No radiopaque foreign body or significant soft tissue swelling. CT CERVICAL SPINE FINDINGS Alignment: No subluxation. Skull base and vertebrae: No skull base fracture. Craniocervical junction aligned. No acute fracture identified of the vertebral elements. Facets maintain alignment. Soft tissues and spinal canal: No hematoma of the canal. Soft tissues relatively unremarkable. Minimal atherosclerosis. Disc levels: C2-C3: No significant foraminal narrowing or canal narrowing. Bilateral facet disease. C3-C4: Moderate right foraminal narrowing secondary to uncovertebral joint  disease and facet disease. No significant canal narrowing or left foraminal narrowing. C4-C5: No significant foraminal narrowing or canal narrowing. Bilateral facet disease. C5-C6: Posterior disc osteophyte complex with bilateral uncovertebral joint disease, with mild foraminal narrowing bilaterally. C6-C7: Posterior disc osteophyte complex mildly narrows the canal. Bilateral uncovertebral joint disease with no significant foraminal narrowing. C7-T1: No significant foraminal narrowing or canal narrowing with disc disease and bilateral uncovertebral joint disease. Upper chest: No acute finding. Other: None IMPRESSION: Head CT: No acute intracranial abnormality. Evidence of chronic microvascular ischemic disease. Maxillofacial CT: No acute fracture. Cervical CT: No acute fracture or malalignment of the cervical spine. Degenerative disease of the cervical spine, as above. Electronically Signed   By: Corrie Mckusick D.O.   On: 07/01/2018 12:35   Ct Cervical Spine Wo Contrast  Result Date: 07/01/2018 CLINICAL DATA:  73 year old male with a history of fall EXAM: CT HEAD WITHOUT CONTRAST CT MAXILLOFACIAL WITHOUT CONTRAST CT CERVICAL SPINE WITHOUT CONTRAST TECHNIQUE: Multidetector CT imaging of the head, cervical spine, and maxillofacial structures were performed using the standard protocol without intravenous contrast. Multiplanar CT image reconstructions of the cervical spine and maxillofacial structures were also generated. COMPARISON:  None. FINDINGS: CT HEAD FINDINGS Brain: No acute intracranial hemorrhage. No midline shift or mass effect. Gray-white differentiation maintained. Unremarkable appearance of the ventricular system. Patchy hypodensity in the bilateral periventricular white matter. Focal hypodensity in the anterior left basal ganglia, potentially a cyst or dilated perivascular space. Vascular: Unremarkable. Skull: No acute fracture.  No aggressive bone lesion identified. Sinuses/Orbits: Unremarkable  appearance of the orbits. Mastoid air cells clear. No middle ear effusion. No significant sinus disease. Other: None CT MAXILLOFACIAL FINDINGS Osseous: No fracture or mandibular dislocation. No destructive process. Orbits: Unremarkable appearance of the orbits Sinuses: Unremarkable appearance of the sinuses. Dental amalgams with significant streak artifact. Soft tissues: Unremarkable soft tissues. No radiopaque foreign body or significant soft tissue swelling. CT CERVICAL SPINE FINDINGS Alignment: No subluxation. Skull base and vertebrae: No skull base fracture. Craniocervical junction aligned. No acute  fracture identified of the vertebral elements. Facets maintain alignment. Soft tissues and spinal canal: No hematoma of the canal. Soft tissues relatively unremarkable. Minimal atherosclerosis. Disc levels: C2-C3: No significant foraminal narrowing or canal narrowing. Bilateral facet disease. C3-C4: Moderate right foraminal narrowing secondary to uncovertebral joint disease and facet disease. No significant canal narrowing or left foraminal narrowing. C4-C5: No significant foraminal narrowing or canal narrowing. Bilateral facet disease. C5-C6: Posterior disc osteophyte complex with bilateral uncovertebral joint disease, with mild foraminal narrowing bilaterally. C6-C7: Posterior disc osteophyte complex mildly narrows the canal. Bilateral uncovertebral joint disease with no significant foraminal narrowing. C7-T1: No significant foraminal narrowing or canal narrowing with disc disease and bilateral uncovertebral joint disease. Upper chest: No acute finding. Other: None IMPRESSION: Head CT: No acute intracranial abnormality. Evidence of chronic microvascular ischemic disease. Maxillofacial CT: No acute fracture. Cervical CT: No acute fracture or malalignment of the cervical spine. Degenerative disease of the cervical spine, as above. Electronically Signed   By: Corrie Mckusick D.O.   On: 07/01/2018 12:35   Dg Hand  Complete Left  Result Date: 07/01/2018 CLINICAL DATA:  Left hand injury in a fall today. Initial encounter. EXAM: LEFT HAND - COMPLETE 3+ VIEW COMPARISON:  None. FINDINGS: There is no acute bony or joint abnormality. Joint spaces are preserved. Soft tissues appear normal. IMPRESSION: Negative exam. Electronically Signed   By: Inge Rise M.D.   On: 07/01/2018 12:12   Dg Hand Complete Right  Result Date: 07/01/2018 CLINICAL DATA:  Right hand injury in a fall today. Initial encounter. EXAM: RIGHT HAND - COMPLETE 3+ VIEW COMPARISON:  None. FINDINGS: There is no acute bony or joint abnormality. No focal bony lesion. Joint spaces are preserved. Soft tissues appear normal. IMPRESSION: Negative exam. Electronically Signed   By: Inge Rise M.D.   On: 07/01/2018 12:11   Ct Maxillofacial Wo Cm  Result Date: 07/01/2018 CLINICAL DATA:  73 year old male with a history of fall EXAM: CT HEAD WITHOUT CONTRAST CT MAXILLOFACIAL WITHOUT CONTRAST CT CERVICAL SPINE WITHOUT CONTRAST TECHNIQUE: Multidetector CT imaging of the head, cervical spine, and maxillofacial structures were performed using the standard protocol without intravenous contrast. Multiplanar CT image reconstructions of the cervical spine and maxillofacial structures were also generated. COMPARISON:  None. FINDINGS: CT HEAD FINDINGS Brain: No acute intracranial hemorrhage. No midline shift or mass effect. Gray-white differentiation maintained. Unremarkable appearance of the ventricular system. Patchy hypodensity in the bilateral periventricular white matter. Focal hypodensity in the anterior left basal ganglia, potentially a cyst or dilated perivascular space. Vascular: Unremarkable. Skull: No acute fracture.  No aggressive bone lesion identified. Sinuses/Orbits: Unremarkable appearance of the orbits. Mastoid air cells clear. No middle ear effusion. No significant sinus disease. Other: None CT MAXILLOFACIAL FINDINGS Osseous: No fracture or mandibular  dislocation. No destructive process. Orbits: Unremarkable appearance of the orbits Sinuses: Unremarkable appearance of the sinuses. Dental amalgams with significant streak artifact. Soft tissues: Unremarkable soft tissues. No radiopaque foreign body or significant soft tissue swelling. CT CERVICAL SPINE FINDINGS Alignment: No subluxation. Skull base and vertebrae: No skull base fracture. Craniocervical junction aligned. No acute fracture identified of the vertebral elements. Facets maintain alignment. Soft tissues and spinal canal: No hematoma of the canal. Soft tissues relatively unremarkable. Minimal atherosclerosis. Disc levels: C2-C3: No significant foraminal narrowing or canal narrowing. Bilateral facet disease. C3-C4: Moderate right foraminal narrowing secondary to uncovertebral joint disease and facet disease. No significant canal narrowing or left foraminal narrowing. C4-C5: No significant foraminal narrowing or canal narrowing. Bilateral facet disease.  C5-C6: Posterior disc osteophyte complex with bilateral uncovertebral joint disease, with mild foraminal narrowing bilaterally. C6-C7: Posterior disc osteophyte complex mildly narrows the canal. Bilateral uncovertebral joint disease with no significant foraminal narrowing. C7-T1: No significant foraminal narrowing or canal narrowing with disc disease and bilateral uncovertebral joint disease. Upper chest: No acute finding. Other: None IMPRESSION: Head CT: No acute intracranial abnormality. Evidence of chronic microvascular ischemic disease. Maxillofacial CT: No acute fracture. Cervical CT: No acute fracture or malalignment of the cervical spine. Degenerative disease of the cervical spine, as above. Electronically Signed   By: Corrie Mckusick D.O.   On: 07/01/2018 12:35    Pending Labs Unresulted Labs (From admission, onward)    Start     Ordered   07/01/18 1302  Urine culture  ONCE - STAT,   STAT     07/01/18 1301   Signed and Held  Basic metabolic  panel  Tomorrow morning,   R     Signed and Held          Vitals/Pain Today's Vitals   07/01/18 1315 07/01/18 1345 07/01/18 1400 07/01/18 1445  BP: (!) 158/78 (!) 169/68 (!) 173/76 (!) 172/76  Pulse: (!) 57 (!) 53 (!) 51 (!) 53  Resp: 12 17 14 17   Temp:      TempSrc:      SpO2: 97% 95% 96% 100%  PainSc:        Isolation Precautions No active isolations  Medications Medications  fentaNYL (SUBLIMAZE) injection 100 mcg (50 mcg Intravenous Given 07/01/18 1135)  sodium chloride 0.9 % bolus 500 mL (0 mLs Intravenous Stopped 07/01/18 1300)  sodium chloride 0.9 % bolus 500 mL (0 mLs Intravenous Stopped 07/01/18 1440)    Mobility walks Moderate fall risk   Focused Assessments Cardiac Assessment Handoff:  Cardiac Rhythm: Sinus bradycardia Lab Results  Component Value Date   TROPONINI <0.03 07/01/2018   No results found for: DDIMER Does the Patient currently have chest pain? No     R Recommendations: See Admitting Provider Note  Report given to:   Additional Notes:  Patient alert and oriented

## 2018-07-02 DIAGNOSIS — L8990 Pressure ulcer of unspecified site, unspecified stage: Secondary | ICD-10-CM

## 2018-07-02 LAB — CBC WITH DIFFERENTIAL/PLATELET
Abs Immature Granulocytes: 0.02 10*3/uL (ref 0.00–0.07)
Basophils Absolute: 0 10*3/uL (ref 0.0–0.1)
Basophils Relative: 1 %
EOS PCT: 4 %
Eosinophils Absolute: 0.2 10*3/uL (ref 0.0–0.5)
HCT: 36.8 % — ABNORMAL LOW (ref 39.0–52.0)
HEMOGLOBIN: 12.3 g/dL — AB (ref 13.0–17.0)
Immature Granulocytes: 0 %
Lymphocytes Relative: 25 %
Lymphs Abs: 1.5 10*3/uL (ref 0.7–4.0)
MCH: 31.2 pg (ref 26.0–34.0)
MCHC: 33.4 g/dL (ref 30.0–36.0)
MCV: 93.4 fL (ref 80.0–100.0)
Monocytes Absolute: 0.5 10*3/uL (ref 0.1–1.0)
Monocytes Relative: 9 %
Neutro Abs: 3.5 10*3/uL (ref 1.7–7.7)
Neutrophils Relative %: 61 %
Platelets: 146 10*3/uL — ABNORMAL LOW (ref 150–400)
RBC: 3.94 MIL/uL — ABNORMAL LOW (ref 4.22–5.81)
RDW: 13.3 % (ref 11.5–15.5)
WBC: 5.7 10*3/uL (ref 4.0–10.5)
nRBC: 0 % (ref 0.0–0.2)

## 2018-07-02 LAB — BASIC METABOLIC PANEL
Anion gap: 16 — ABNORMAL HIGH (ref 5–15)
BUN: 22 mg/dL (ref 8–23)
CO2: 21 mmol/L — ABNORMAL LOW (ref 22–32)
Calcium: 8.8 mg/dL — ABNORMAL LOW (ref 8.9–10.3)
Chloride: 100 mmol/L (ref 98–111)
Creatinine, Ser: 1.82 mg/dL — ABNORMAL HIGH (ref 0.61–1.24)
GFR calc Af Amer: 42 mL/min — ABNORMAL LOW (ref >60)
GFR calc non Af Amer: 36 mL/min — ABNORMAL LOW (ref >60)
Glucose, Bld: 124 mg/dL — ABNORMAL HIGH (ref 70–99)
POTASSIUM: 3.3 mmol/L — AB (ref 3.5–5.1)
Sodium: 137 mmol/L (ref 135–145)

## 2018-07-02 LAB — GLUCOSE, CAPILLARY
Glucose-Capillary: 130 mg/dL — ABNORMAL HIGH (ref 70–99)
Glucose-Capillary: 102 mg/dL — ABNORMAL HIGH (ref 70–99)
Glucose-Capillary: 195 mg/dL — ABNORMAL HIGH (ref 70–99)
Glucose-Capillary: 108 mg/dL — ABNORMAL HIGH (ref 70–99)

## 2018-07-02 LAB — URINE CULTURE: Culture: NO GROWTH

## 2018-07-02 MED ORDER — CEPHALEXIN 500 MG PO CAPS
500.0000 mg | ORAL_CAPSULE | Freq: Two times a day (BID) | ORAL | Status: DC
  Administered 2018-07-02 – 2018-07-03 (×3): 500 mg via ORAL
  Filled 2018-07-02 (×3): qty 1

## 2018-07-02 MED ORDER — ENOXAPARIN SODIUM 40 MG/0.4ML ~~LOC~~ SOLN: 40.0000 mg | SUBCUTANEOUS | Status: DC

## 2018-07-02 MED ORDER — ENSURE ENLIVE PO LIQD
237.0000 mL | Freq: Two times a day (BID) | ORAL | Status: DC
  Administered 2018-07-02 – 2018-07-03 (×2): 237 mL via ORAL

## 2018-07-02 MED ORDER — HYDRALAZINE HCL 20 MG/ML IJ SOLN: 10.0000 mg | Freq: Four times a day (QID) | INTRAMUSCULAR | Status: DC | PRN

## 2018-07-02 MED ORDER — HYDRALAZINE HCL 20 MG/ML IJ SOLN
10.0000 mg | Freq: Once | INTRAMUSCULAR | Status: AC
  Administered 2018-07-02: 10 mg via INTRAVENOUS
  Filled 2018-07-02: qty 1

## 2018-07-02 MED ORDER — SODIUM CHLORIDE 0.9 % IV SOLN
INTRAVENOUS | Status: DC
  Administered 2018-07-02 – 2018-07-03 (×3): via INTRAVENOUS

## 2018-07-02 MED ORDER — HYDRALAZINE HCL 25 MG PO TABS
25.0000 mg | ORAL_TABLET | Freq: Three times a day (TID) | ORAL | Status: DC
  Administered 2018-07-02 – 2018-07-03 (×3): 25 mg via ORAL
  Filled 2018-07-02 (×3): qty 1

## 2018-07-02 MED ORDER — VITAMIN B-1 100 MG PO TABS
100.0000 mg | ORAL_TABLET | Freq: Every day | ORAL | Status: DC
  Administered 2018-07-02 – 2018-07-03 (×2): 100 mg via ORAL
  Filled 2018-07-02 (×2): qty 1

## 2018-07-02 MED ORDER — FOLIC ACID 1 MG PO TABS
1.0000 mg | ORAL_TABLET | Freq: Every day | ORAL | Status: DC
  Administered 2018-07-02 – 2018-07-03 (×2): 1 mg via ORAL
  Filled 2018-07-02 (×2): qty 1

## 2018-07-02 MED ORDER — POTASSIUM CHLORIDE CRYS ER 20 MEQ PO TBCR
40.0000 meq | EXTENDED_RELEASE_TABLET | Freq: Once | ORAL | Status: AC
  Administered 2018-07-02: 40 meq via ORAL
  Filled 2018-07-02: qty 2

## 2018-07-02 MED ORDER — AMLODIPINE BESYLATE 10 MG PO TABS
10.0000 mg | ORAL_TABLET | Freq: Every day | ORAL | Status: DC
  Administered 2018-07-02 – 2018-07-03 (×2): 10 mg via ORAL
  Filled 2018-07-02 (×2): qty 1

## 2018-07-02 NOTE — Progress Notes (Signed)
Patient has blood and blood clots coming out of his penis,pt denies any pain. Per patient,he was catheterized in the ED when he came because he cannot urinate.MD on call text paged. Order received in epic. Will continue to monitor. Mitchell Hunter, Wonda Cheng, Therapist, sports

## 2018-07-02 NOTE — TOC Initial Note (Signed)
Transition of Care [facility]) - Initial/Assessment Note    Patient Details  Name: Mitchell Hunter MRN: [id number] Date of Birth: November 12, 1945  [facility] CM/SW Contact:    Bartholomew Crews, RN Phone Number: 07/02/2018, 4:08 PM  Clinical Narrative:                 Spoke with patient at bedside. Discussed PT recommendations for [facility] PT. Patient agreeable. Choice list offered. Patient not wanting to make decision at this time stating he will discuss with his wife when she calls. Declined RW at this time stating his wife is making arrangements to  [place] one from family. Advised that if he gets home and decides he needs one that his PCP can provide an order. States that his wife will provide transportation home and to f/u medical appointments. Reports no medication problems. CM to follow for transition of care needs.   Expected Discharge Plan: Home/Self Care Barriers to Discharge: No Barriers Identified   Patient Goals and CMS Choice   CMS Medicare.gov Compare Post Acute Care list provided to:: Patient Choice offered to / list presented to : Patient  Expected Discharge Plan and Services Expected Discharge Plan: Home/Self Care In-house Referral: NA Discharge Planning Services: CM Consult Post Acute Care Choice: Home Health, Durable Medical Equipment(refused DME-RW stating he has access to one) Living arrangements for the past 2 months: Single Family Home                 DME Arranged: N/A DME Agency: NA HH Arranged: PT    Prior Living Arrangements/Services Living arrangements for the past 2 months: Single Family Home Lives with:: Self, Spouse Patient language and need for interpreter reviewed:: Yes Do you feel safe going back to the place where you live?: Yes      Need for Family Participation in Patient Care: Yes (Comment) Care giver support system in place?: Yes (comment)   Criminal Activity/Legal Involvement Pertinent to Current Situation/Hospitalization: No - Comment as  needed  Activities of Daily Living Home Assistive Devices/Equipment: Hand-held shower hose, CBG Meter ADL Screening (condition at time of admission) Patient's cognitive ability adequate to safely complete daily activities?: Yes Is the patient deaf or have difficulty hearing?: No Does the patient have difficulty seeing, even when wearing glasses/contacts?: No Does the patient have difficulty concentrating, remembering, or making decisions?: No Patient able to express need for assistance with ADLs?: No Does the patient have difficulty dressing or bathing?: No Independently performs ADLs?: Yes (appropriate for developmental age) Does the patient have difficulty walking or climbing stairs?: No Weakness of Legs: Both Weakness of Arms/Hands: Both  Permission Sought/Granted Permission sought to share information with : Family Supports Permission granted to share information with : Yes, Verbal Permission Granted        Permission granted to share info w Relationship: spouse     Emotional Assessment Appearance:: Appears stated age Attitude/Demeanor/Rapport: Engaged Affect (typically observed): Accepting Orientation: : Oriented to Self, Oriented to  Time, Oriented to Place, Oriented to Situation   Psych Involvement: No (comment)  Admission diagnosis:  Dehydration [E86.0] Bradycardia [R00.1] AKI (acute kidney injury) ([place]) [N17.9] Fall, initial encounter [W19.XXXA] Abrasion of forehead, initial encounter [S00.81XA] Patient Active Problem List   Diagnosis Date Noted  . Pressure injury of skin 07/02/2018  . AKI (acute kidney injury) ([place]) 07/01/2018  . HNP (herniated nucleus pulposus), lumbar 05/04/2013   PCP:  Christain Sacramento, MD Pharmacy:   [id number] [place], [place]  [address] [place] [address] Phone: [phone] Fax: [phone]  [place] [address] [place] [address] [place] [address] Phone: [phone] Fax: [phone]     Social Determinants of Health (SDOH) Interventions    Readmission Risk Interventions No flowsheet data found.

## 2018-07-02 NOTE — Evaluation (Signed)
Physical Therapy Evaluation Patient Details Name: Mitchell Hunter MRN: [id number] DOB: 1945/08/10 Today's Date: 07/02/2018   History of Present Illness  Pt is a 73 y/o male with a PMH significant for anxiety, chronic renal insufficiency III, DM, gout, HTN, R elbow scope, Bilateral knee scopes, L3-L4 lami/decompression 2015. He presents s/p 1 week history of increasing weakness and poor p.o. intake. He had a fall day of admission. Pt's wife reports confusion. In ED, pt was found to have AKI and questionable UTI. Imaging negative for injury from the fall.   Clinical Impression  Pt admitted with above diagnosis. Pt currently with functional limitations due to the deficits listed below (see PT Problem List). At the time of PT eval pt was able to perform transfers and ambulation with gross min guard assist for balance support and safety. Pt very guarded throughout session and with poor dynamic standing balance. Session limited by bleeding from penis, however would like to see pt ambulate with a RW when he is able to tolerate. Pt will benefit from skilled PT to increase their independence and safety with mobility to allow discharge to the venue listed below.       Follow Up Recommendations Home health PT;Supervision for mobility/OOB    Equipment Recommendations  Rolling walker with 5" wheels(If pt can't borrow one)    Recommendations for Other Services       Precautions / Restrictions Precautions Precautions: Fall Precaution Comments: Bleeding from penis - mesh briefs and pad provided for session Restrictions Weight Bearing Restrictions: No      Mobility  Bed Mobility Overal bed mobility: Needs Assistance Bed Mobility: Supine to Sit     Supine to sit: Supervision     General bed mobility comments: Light supervision. HOB elevated. Increased time required due to guarding from bleeding penis  Transfers Overall transfer level: Needs assistance Equipment used: None Transfers: Sit to/from  Stand Sit to Stand: Min guard         General transfer comment: Close guard for safety as pt powered up to full stand. Pt appeared guarded but no assist required to maintain balance.   Ambulation/Gait Ambulation/Gait assistance: Min guard Gait Distance (Feet): 15 Feet Assistive device: None Gait Pattern/deviations: Step-through pattern;Decreased stride length;Staggering left Gait velocity: Decreased Gait velocity interpretation: 1.31 - 2.62 ft/sec, indicative of limited community ambulator General Gait Details: Mild staggering as pt made the turn around the bed to get to the chair. Close guard for safety but no assistance required to recover.   Stairs            Wheelchair Mobility    Modified Rankin (Stroke Patients Only)       Balance Overall balance assessment: Needs assistance Sitting-balance support: Feet supported;No upper extremity supported Sitting balance-Leahy Scale: Fair     Standing balance support: No upper extremity supported;During functional activity Standing balance-Leahy Scale: Fair Standing balance comment: Approaching "poor"                             Pertinent Vitals/Pain Pain Assessment: Faces Faces Pain Scale: Hurts little more Pain Location: LUE (from IV) Pain Descriptors / Indicators: Grimacing Pain Intervention(s): Monitored during session    Home Living Family/patient expects to be discharged to:: Private residence Living Arrangements: Spouse/significant other Available Help at Discharge: Family;Available 24 hours/day Type of Home: House         Home Equipment: Walker - 2 wheels      Prior Function  Level of Independence: Independent         Comments: Pt is retired from [employer].     Hand Dominance        Extremity/Trunk Assessment   Upper Extremity Assessment Upper Extremity Assessment: Defer to OT evaluation    Lower Extremity Assessment Lower Extremity Assessment: Generalized weakness     Cervical / Trunk Assessment Cervical / Trunk Assessment: Normal;Other exceptions Cervical / Trunk Exceptions: prior back surgery; forward head posture with rounded shoulders  Communication   Communication: No difficulties  Cognition Arousal/Alertness: Awake/alert Behavior During Therapy: WFL for tasks assessed/performed Overall Cognitive Status: Within Functional Limits for tasks assessed                                        General Comments      Exercises     Assessment/Plan    PT Assessment Patient needs continued PT services  PT Problem List Decreased strength;Decreased activity tolerance;Decreased balance;Decreased mobility;Decreased knowledge of use of DME;Decreased safety awareness;Decreased knowledge of precautions       PT Treatment Interventions DME instruction;Gait training;Stair training;Functional mobility training;Therapeutic activities;Therapeutic exercise;Neuromuscular re-education;Patient/family education    PT Goals (Current goals can be found in the Care Plan section)  Acute Rehab PT Goals Patient Stated Goal: Go home - back to PLOF PT Goal Formulation: With patient Time For Goal Achievement: 07/09/18 Potential to Achieve Goals: Good    Frequency Min 3X/week   Barriers to discharge        Co-evaluation               AM-PAC PT "6 Clicks" Mobility  Outcome Measure Help needed turning from your back to your side while in a flat bed without using bedrails?: None Help needed moving from lying on your back to sitting on the side of a flat bed without using bedrails?: A Little Help needed moving to and from a bed to a chair (including a wheelchair)?: A Little Help needed standing up from a chair using your arms (e.g., wheelchair or bedside chair)?: A Little Help needed to walk in hospital room?: A Little Help needed climbing 3-5 steps with a railing? : A Little 6 Click Score: 19    End of Session Equipment Utilized During  Treatment: Gait belt Activity Tolerance: Patient tolerated treatment well Patient left: in chair;with call bell/phone within reach;with chair alarm set Nurse Communication: Mobility status PT Visit Diagnosis: Unsteadiness on feet (R26.81);Muscle weakness (generalized) (M62.81);History of falling (Z91.81)    Time: 3903-0092 PT Time Calculation (min) (ACUTE ONLY): 25 min   Charges:   PT Evaluation $PT Eval Moderate Complexity: 1 Mod PT Treatments $Gait Training: 8-22 mins        Rolinda Roan, PT, DPT Acute Rehabilitation Services Pager: [phone] Office: [phone]   Thelma Comp 07/02/2018, 11:48 AM

## 2018-07-02 NOTE — Progress Notes (Signed)
Patient blood pressure was 182/78 and heart rate 57 . Hydralazine 5 mg given at 0218h. Repeat blood pressure was 190/79 and heart rate 54. Blount, NP text paged. Hydralazine 10 mg ordered by Kennon Holter, NP given at [phone]. Repeat Blood pressure is 160/78 and heart rate is 54.

## 2018-07-02 NOTE — Progress Notes (Signed)
PROGRESS NOTE    Mitchell Hunter  SFS:[id number] DOB: Dec 11, 1945 DOA: 07/01/2018 PCP: Christain Sacramento, MD   Brief Narrative: Patient is a 73 year old male with history of tobacco abuse/chronic alcohol abuse, hypertension, diabetes type 2, CKD stage IIwho was brought to the emergency department after he fell at home.  He was feeling weak, dizzy since last several days and also had very poor oral intake.  Denies any fever, chills, cough or shortness of breath on presentation.  Work-up in the emergency department showed acute kidney injury, possible urinary tract infection.  Imagings were negative for fracture or dislocation.  Assessment & Plan:   Active Problems:   AKI (acute kidney injury) ([place])   Pressure injury of skin   Acute kidney injury on CKD stage 2 : Most likely secondary to decreased oral intake.  Patient was dehydrated on presentation.  Continue IV fluids for today.  Kidney function improving.  His baseline creatinine is around 1.4.  [place].  He usually walks fine without any assistance as per the patient.  Was feeling dizzy at home.  Imagings done here on presentation did not show any fracture or dislocation.  Will request for physical therapy evaluation.  Tobacco abuse: Smokes 1and  1/2 packs of cigarettes a day counseled for cessation.  Chronic alcohol abuse: Everyday drinker.  Usually drinks 2-3 drinks a day, hard liquor.  Counseled for cessation.  Start on thiamine folic acid. Fall could be associated with alcohol.  Alcohol level not tested on presentation.  Hypokalemia: Being supplemented  Hypertension: Hypertensive this morning.  Home medications on hold due to acute kidney injury.  Start on amlodipine and hydralazine.  Continue PRN meds.  Continue to monitor blood pressure.  Diabetes type 2: On metformin at home.  Started on sliding scale insulin.  Suspected urinary tract infection: Denies any dysuria.  UA suggestive of urinary tract infection, urine culture  sent.  Started on keflex.          DVT prophylaxis: Lovenox Code Status: Full Family Communication: None present at the bedside Disposition Plan: Waiting for PT evaluation.  Likely tomorrow to home   Consultants: None  Procedures: None  Antimicrobials:  Anti-infectives (From admission, onward)   Start     Dose/Rate Route Frequency Ordered Stop   07/03/18 1600  levofloxacin (LEVAQUIN) IVPB 500 mg     500 mg 100 mL/hr over 60 Minutes Intravenous Every 48 hours 07/01/18 1608     07/01/18 1530  levofloxacin (LEVAQUIN) IVPB 750 mg     750 mg 100 mL/hr over 90 Minutes Intravenous  Once 07/01/18 1524 07/01/18 1715      Subjective: Patient seen and examined the bedside this morning.  Feels better now.  Mildly hypertensive today.  Denies any specific complaints.  Objective: Vitals:   07/02/18 0326 07/02/18 0429 07/02/18 0453 07/02/18 0942  BP: (!) 190/79 (!) 179/66 (!) 160/78 (!) 175/68  Pulse: (!) 54 (!) 57 (!) 54 (!) 54  Resp:  19  18  Temp:  97.7 F (36.5 C)  98 F (36.7 C)  TempSrc:  Oral  Oral  SpO2:  98% 98% 98%  Weight:      Height:        Intake/Output Summary (Last 24 hours) at 07/02/2018 1025 Last data filed at 07/02/2018 0900 Gross per 24 hour  Intake 2192.65 ml  Output 380 ml  Net 1812.65 ml   Filed Weights   07/01/18 1500 07/01/18 1956  Weight: 75.3 kg 75.2 kg  Examination:  General exam: Appears calm and comfortable ,Not in distress,weak HEENT:PERRL,Oral mucosa moist, Ear/Nose normal on gross exam,bruise on the forehead Respiratory system: Bilateral equal air entry, normal vesicular breath sounds, no wheezes or crackles  Cardiovascular system: S1 & S2 heard, RRR. No JVD, murmurs, rubs, gallops or clicks. No pedal edema. Gastrointestinal system: Abdomen is nondistended, soft and nontender. No organomegaly or masses felt. Normal bowel sounds heard. Central nervous system: Alert and oriented. No focal neurological deficits. Extremities: No edema,  no clubbing ,no cyanosis, distal peripheral pulses palpable. Skin: No rashes, lesions or ulcers,no icterus ,no pallor MSK: Normal muscle bulk,tone ,power Psychiatry: Judgement and insight appear normal. Mood & affect appropriate.     Data Reviewed: I have personally reviewed following labs and imaging studies  CBC: Recent Labs  Lab 07/01/18 1126 07/02/18 0247  WBC 6.8 5.7  NEUTROABS 4.4 3.5  HGB 13.2 12.3*  HCT 40.8 36.8*  MCV 96.5 93.4  PLT 157 101*   Basic Metabolic Panel: Recent Labs  Lab 07/01/18 1126 07/02/18 0247  NA 136 137  K 3.8 3.3*  CL 99 100  CO2 22 21*  GLUCOSE 103* 124*  BUN 26* 22  CREATININE 2.37* 1.82*  CALCIUM 9.1 8.8*   GFR: Estimated Creatinine Clearance: 38.4 mL/min (A) (by C-G formula based on SCr of 1.82 mg/dL (H)). Liver Function Tests: Recent Labs  Lab 07/01/18 1126  AST 25  ALT 18  ALKPHOS 53  BILITOT 1.1  PROT 6.3*  ALBUMIN 3.5   No results for input(s): LIPASE, AMYLASE in the last 168 hours. No results for input(s): AMMONIA in the last 168 hours. Coagulation Profile: No results for input(s): INR, PROTIME in the last 168 hours. Cardiac Enzymes: Recent Labs  Lab 07/01/18 1126  TROPONINI <0.03   BNP (last 3 results) No results for input(s): PROBNP in the last 8760 hours. HbA1C: No results for input(s): HGBA1C in the last 72 hours. CBG: Recent Labs  Lab 07/01/18 1639 07/01/18 1957 07/02/18 0737  GLUCAP 89 102* 130*   Lipid Profile: No results for input(s): CHOL, HDL, LDLCALC, TRIG, CHOLHDL, LDLDIRECT in the last 72 hours. Thyroid Function Tests: No results for input(s): TSH, T4TOTAL, FREET4, T3FREE, THYROIDAB in the last 72 hours. Anemia Panel: No results for input(s): VITAMINB12, FOLATE, FERRITIN, TIBC, IRON, RETICCTPCT in the last 72 hours. Sepsis Labs: No results for input(s): PROCALCITON, LATICACIDVEN in the last 168 hours.  No results found for this or any previous visit (from the past 240 hour(s)).        Radiology Studies: Dg Chest 2 View  Result Date: 07/01/2018 CLINICAL DATA:  Status post fall today. EXAM: CHEST - 2 VIEW COMPARISON:  CT chest 02/01/2018.  PA and lateral chest 07/22/2013. FINDINGS: Lung volumes are low but the lungs are clear. Heart size is normal. No pneumothorax or pleural fluid. No acute or focal bony abnormality. IMPRESSION: Negative chest. Electronically Signed   By: Inge Rise M.D.   On: 07/01/2018 12:10   Ct Head Wo Contrast  Result Date: 07/01/2018 CLINICAL DATA:  73 year old male with a history of fall EXAM: CT HEAD WITHOUT CONTRAST CT MAXILLOFACIAL WITHOUT CONTRAST CT CERVICAL SPINE WITHOUT CONTRAST TECHNIQUE: Multidetector CT imaging of the head, cervical spine, and maxillofacial structures were performed using the standard protocol without intravenous contrast. Multiplanar CT image reconstructions of the cervical spine and maxillofacial structures were also generated. COMPARISON:  None. FINDINGS: CT HEAD FINDINGS Brain: No acute intracranial hemorrhage. No midline shift or mass effect. Gray-white differentiation maintained.  Unremarkable appearance of the ventricular system. Patchy hypodensity in the bilateral periventricular white matter. Focal hypodensity in the anterior left basal ganglia, potentially a cyst or dilated perivascular space. Vascular: Unremarkable. Skull: No acute fracture.  No aggressive bone lesion identified. Sinuses/Orbits: Unremarkable appearance of the orbits. Mastoid air cells clear. No middle ear effusion. No significant sinus disease. Other: None CT MAXILLOFACIAL FINDINGS Osseous: No fracture or mandibular dislocation. No destructive process. Orbits: Unremarkable appearance of the orbits Sinuses: Unremarkable appearance of the sinuses. Dental amalgams with significant streak artifact. Soft tissues: Unremarkable soft tissues. No radiopaque foreign body or significant soft tissue swelling. CT CERVICAL SPINE FINDINGS Alignment: No  subluxation. Skull base and vertebrae: No skull base fracture. Craniocervical junction aligned. No acute fracture identified of the vertebral elements. Facets maintain alignment. Soft tissues and spinal canal: No hematoma of the canal. Soft tissues relatively unremarkable. Minimal atherosclerosis. Disc levels: C2-C3: No significant foraminal narrowing or canal narrowing. Bilateral facet disease. C3-C4: Moderate right foraminal narrowing secondary to uncovertebral joint disease and facet disease. No significant canal narrowing or left foraminal narrowing. C4-C5: No significant foraminal narrowing or canal narrowing. Bilateral facet disease. C5-C6: Posterior disc osteophyte complex with bilateral uncovertebral joint disease, with mild foraminal narrowing bilaterally. C6-C7: Posterior disc osteophyte complex mildly narrows the canal. Bilateral uncovertebral joint disease with no significant foraminal narrowing. C7-T1: No significant foraminal narrowing or canal narrowing with disc disease and bilateral uncovertebral joint disease. Upper chest: No acute finding. Other: None IMPRESSION: Head CT: No acute intracranial abnormality. Evidence of chronic microvascular ischemic disease. Maxillofacial CT: No acute fracture. Cervical CT: No acute fracture or malalignment of the cervical spine. Degenerative disease of the cervical spine, as above. Electronically Signed   By: Corrie Mckusick D.O.   On: 07/01/2018 12:35   Ct Cervical Spine Wo Contrast  Result Date: 07/01/2018 CLINICAL DATA:  73 year old male with a history of fall EXAM: CT HEAD WITHOUT CONTRAST CT MAXILLOFACIAL WITHOUT CONTRAST CT CERVICAL SPINE WITHOUT CONTRAST TECHNIQUE: Multidetector CT imaging of the head, cervical spine, and maxillofacial structures were performed using the standard protocol without intravenous contrast. Multiplanar CT image reconstructions of the cervical spine and maxillofacial structures were also generated. COMPARISON:  None. FINDINGS:  CT HEAD FINDINGS Brain: No acute intracranial hemorrhage. No midline shift or mass effect. Gray-white differentiation maintained. Unremarkable appearance of the ventricular system. Patchy hypodensity in the bilateral periventricular white matter. Focal hypodensity in the anterior left basal ganglia, potentially a cyst or dilated perivascular space. Vascular: Unremarkable. Skull: No acute fracture.  No aggressive bone lesion identified. Sinuses/Orbits: Unremarkable appearance of the orbits. Mastoid air cells clear. No middle ear effusion. No significant sinus disease. Other: None CT MAXILLOFACIAL FINDINGS Osseous: No fracture or mandibular dislocation. No destructive process. Orbits: Unremarkable appearance of the orbits Sinuses: Unremarkable appearance of the sinuses. Dental amalgams with significant streak artifact. Soft tissues: Unremarkable soft tissues. No radiopaque foreign body or significant soft tissue swelling. CT CERVICAL SPINE FINDINGS Alignment: No subluxation. Skull base and vertebrae: No skull base fracture. Craniocervical junction aligned. No acute fracture identified of the vertebral elements. Facets maintain alignment. Soft tissues and spinal canal: No hematoma of the canal. Soft tissues relatively unremarkable. Minimal atherosclerosis. Disc levels: C2-C3: No significant foraminal narrowing or canal narrowing. Bilateral facet disease. C3-C4: Moderate right foraminal narrowing secondary to uncovertebral joint disease and facet disease. No significant canal narrowing or left foraminal narrowing. C4-C5: No significant foraminal narrowing or canal narrowing. Bilateral facet disease. C5-C6: Posterior disc osteophyte complex with bilateral uncovertebral joint disease,  with mild foraminal narrowing bilaterally. C6-C7: Posterior disc osteophyte complex mildly narrows the canal. Bilateral uncovertebral joint disease with no significant foraminal narrowing. C7-T1: No significant foraminal narrowing or canal  narrowing with disc disease and bilateral uncovertebral joint disease. Upper chest: No acute finding. Other: None IMPRESSION: Head CT: No acute intracranial abnormality. Evidence of chronic microvascular ischemic disease. Maxillofacial CT: No acute fracture. Cervical CT: No acute fracture or malalignment of the cervical spine. Degenerative disease of the cervical spine, as above. Electronically Signed   By: Corrie Mckusick D.O.   On: 07/01/2018 12:35   Dg Hand Complete Left  Result Date: 07/01/2018 CLINICAL DATA:  Left hand injury in a fall today. Initial encounter. EXAM: LEFT HAND - COMPLETE 3+ VIEW COMPARISON:  None. FINDINGS: There is no acute bony or joint abnormality. Joint spaces are preserved. Soft tissues appear normal. IMPRESSION: Negative exam. Electronically Signed   By: Inge Rise M.D.   On: 07/01/2018 12:12   Dg Hand Complete Right  Result Date: 07/01/2018 CLINICAL DATA:  Right hand injury in a fall today. Initial encounter. EXAM: RIGHT HAND - COMPLETE 3+ VIEW COMPARISON:  None. FINDINGS: There is no acute bony or joint abnormality. No focal bony lesion. Joint spaces are preserved. Soft tissues appear normal. IMPRESSION: Negative exam. Electronically Signed   By: Inge Rise M.D.   On: 07/01/2018 12:11   Ct Maxillofacial Wo Cm  Result Date: 07/01/2018 CLINICAL DATA:  73 year old male with a history of fall EXAM: CT HEAD WITHOUT CONTRAST CT MAXILLOFACIAL WITHOUT CONTRAST CT CERVICAL SPINE WITHOUT CONTRAST TECHNIQUE: Multidetector CT imaging of the head, cervical spine, and maxillofacial structures were performed using the standard protocol without intravenous contrast. Multiplanar CT image reconstructions of the cervical spine and maxillofacial structures were also generated. COMPARISON:  None. FINDINGS: CT HEAD FINDINGS Brain: No acute intracranial hemorrhage. No midline shift or mass effect. Gray-white differentiation maintained. Unremarkable appearance of the ventricular system.  Patchy hypodensity in the bilateral periventricular white matter. Focal hypodensity in the anterior left basal ganglia, potentially a cyst or dilated perivascular space. Vascular: Unremarkable. Skull: No acute fracture.  No aggressive bone lesion identified. Sinuses/Orbits: Unremarkable appearance of the orbits. Mastoid air cells clear. No middle ear effusion. No significant sinus disease. Other: None CT MAXILLOFACIAL FINDINGS Osseous: No fracture or mandibular dislocation. No destructive process. Orbits: Unremarkable appearance of the orbits Sinuses: Unremarkable appearance of the sinuses. Dental amalgams with significant streak artifact. Soft tissues: Unremarkable soft tissues. No radiopaque foreign body or significant soft tissue swelling. CT CERVICAL SPINE FINDINGS Alignment: No subluxation. Skull base and vertebrae: No skull base fracture. Craniocervical junction aligned. No acute fracture identified of the vertebral elements. Facets maintain alignment. Soft tissues and spinal canal: No hematoma of the canal. Soft tissues relatively unremarkable. Minimal atherosclerosis. Disc levels: C2-C3: No significant foraminal narrowing or canal narrowing. Bilateral facet disease. C3-C4: Moderate right foraminal narrowing secondary to uncovertebral joint disease and facet disease. No significant canal narrowing or left foraminal narrowing. C4-C5: No significant foraminal narrowing or canal narrowing. Bilateral facet disease. C5-C6: Posterior disc osteophyte complex with bilateral uncovertebral joint disease, with mild foraminal narrowing bilaterally. C6-C7: Posterior disc osteophyte complex mildly narrows the canal. Bilateral uncovertebral joint disease with no significant foraminal narrowing. C7-T1: No significant foraminal narrowing or canal narrowing with disc disease and bilateral uncovertebral joint disease. Upper chest: No acute finding. Other: None IMPRESSION: Head CT: No acute intracranial abnormality. Evidence of  chronic microvascular ischemic disease. Maxillofacial CT: No acute fracture. Cervical CT: No acute fracture  or malalignment of the cervical spine. Degenerative disease of the cervical spine, as above. Electronically Signed   By: Corrie Mckusick D.O.   On: 07/01/2018 12:35        Scheduled Meds:  ALPRAZolam  0.25-0.5 mg Oral QHS   amLODipine  10 mg Oral Daily   aspirin EC  81 mg Oral Daily   enoxaparin (LOVENOX) injection  30 mg Subcutaneous F38V   folic acid  1 mg Oral Daily   hydrALAZINE  25 mg Oral Q8H   insulin aspart  0-5 Units Subcutaneous QHS   insulin aspart  0-9 Units Subcutaneous TID WC   thiamine  100 mg Oral Daily   Continuous Infusions:  sodium chloride     [START ON 07/03/2018] levofloxacin (LEVAQUIN) IV       LOS: 1 day    Time spent: 35 mins.More than 50% of that time was spent in counseling and/or coordination of care.      Shelly Coss, MD [facility] Pager [phone]  If 7PM-7AM, please contact night-coverage [email or web address] Password [facility] 07/02/2018, 10:25 AM

## 2018-07-02 NOTE — Progress Notes (Signed)
Initial Nutrition Assessment   RD working remotely.   DOCUMENTATION CODES:   Not applicable  INTERVENTION:   Ensure Enlive po BID, each supplement provides 350 kcal and 20 grams of protein   NUTRITION DIAGNOSIS:   Inadequate oral intake related to poor appetite as evidenced by per patient/family report.  GOAL:   Patient will meet greater than or equal to 90% of their needs  MONITOR:   PO intake, Supplement acceptance, Labs, Weight trends  REASON FOR ASSESSMENT:   Malnutrition Screening Tool    ASSESSMENT:    73 yo male admitted with 1 week gradual increasing weakness and poor po intake admitted with AKI, possible UTI vs viral illness. PMH includes HTN, DM, chronic renal insufficiency stage II/III   No recorded po intake. Appetite improving.   Weight stable recently; current wt 75.2 kg; weight of 75.3 kg in 2019.   Labs: potassium 3.3 (L), Creatinine 1.82 Meds: D5-NS at 75 ml/hr  NUTRITION - FOCUSED PHYSICAL EXAM:  Unable to assess  Diet Order:   Diet Order            Diet Carb Modified Fluid consistency: Thin; Room service appropriate? Yes  Diet effective now              EDUCATION NEEDS:   No education needs have been identified at this time  Skin:  Skin Assessment: Skin Integrity Issues: Skin Integrity Issues:: Stage I Stage I: buttocks  Last BM:  3/26  Height:   Ht Readings from Last 1 Encounters:  07/02/18 6' (1.829 m)    Weight:   Wt Readings from Last 1 Encounters:  07/01/18 75.2 kg    Ideal Body Weight:  80.9 kg  BMI:  Body mass index is 22.48 kg/m.  Estimated Nutritional Needs:   Kcal:  1800-2000 kcals   Protein:  90-100 g  Fluid:  >/= 1.8 L   Kerman Passey MS, RD, LDN, CNSC [phone] Pager  [phone] Weekend/On-Call Pager

## 2018-07-03 LAB — BASIC METABOLIC PANEL
Anion gap: 11 (ref 5–15)
BUN: 15 mg/dL (ref 8–23)
CHLORIDE: 105 mmol/L (ref 98–111)
CO2: 22 mmol/L (ref 22–32)
CREATININE: 1.52 mg/dL — AB (ref 0.61–1.24)
Calcium: 8.9 mg/dL (ref 8.9–10.3)
GFR calc Af Amer: 52 mL/min — ABNORMAL LOW (ref >60)
GFR calc non Af Amer: 45 mL/min — ABNORMAL LOW (ref >60)
Glucose, Bld: 116 mg/dL — ABNORMAL HIGH (ref 70–99)
Potassium: 3.6 mmol/L (ref 3.5–5.1)
Sodium: 138 mmol/L (ref 135–145)

## 2018-07-03 LAB — GLUCOSE, CAPILLARY
Glucose-Capillary: 113 mg/dL — ABNORMAL HIGH (ref 70–99)
Glucose-Capillary: 147 mg/dL — ABNORMAL HIGH (ref 70–99)

## 2018-07-03 MED ORDER — HYDRALAZINE HCL 50 MG PO TABS: 50.0000 mg | ORAL_TABLET | Freq: Three times a day (TID) | ORAL | Status: DC

## 2018-07-03 MED ORDER — THIAMINE HCL 100 MG PO TABS: 100.0000 mg | ORAL_TABLET | Freq: Every day | ORAL | 0 refills | Status: AC

## 2018-07-03 MED ORDER — FOLIC ACID 1 MG PO TABS: 1.0000 mg | ORAL_TABLET | Freq: Every day | ORAL | 0 refills | Status: AC

## 2018-07-03 MED ORDER — AMLODIPINE BESYLATE 10 MG PO TABS: 10.0000 mg | ORAL_TABLET | Freq: Every day | ORAL | 0 refills | Status: AC

## 2018-07-03 NOTE — Progress Notes (Signed)
Physical Therapy Treatment Patient Details Name: Mitchell Hunter MRN: [id number] DOB: May 20, 1945 Today's Date: 07/03/2018    History of Present Illness Pt is a 73 y/o male with a PMH significant for anxiety, chronic renal insufficiency III, DM, gout, HTN, R elbow scope, Bilateral knee scopes, L3-L4 lami/decompression 2015. He presents s/p 1 week history of increasing weakness and poor p.o. intake. He had a fall day of admission. Pt's wife reports confusion. In ED, pt was found to have AKI and questionable UTI. Imaging negative for injury from the fall.     PT Comments    Patient seen for mobility progression. Pt is making progress toward PT goals and tolerated session well. Pt requires supervision/min guard for functional transfers and gait/stair training. Pt is much more steady with use of RW. Current plan remains appropriate.   Follow Up Recommendations  Home health PT;Supervision for mobility/OOB     Equipment Recommendations  Rolling walker with 5" wheels   Recommendations for Other Services       Precautions / Restrictions Precautions Precautions: Fall Precaution Comments: Bleeding from penis - mesh briefs and pad provided for session Restrictions Weight Bearing Restrictions: No    Mobility  Bed Mobility Overal bed mobility: Modified Independent Bed Mobility: Supine to Sit           General bed mobility comments: increased time and effort  Transfers Overall transfer level: Needs assistance Equipment used: None Transfers: Sit to/from Stand Sit to Stand: Min guard         General transfer comment: min guard for safety; safe hand placement demonstrated with stand from EOB and BSC  Ambulation/Gait Ambulation/Gait assistance: Min guard;Supervision Gait Distance (Feet): 120 Feet Assistive device: Rolling walker (2 wheeled) Gait Pattern/deviations: Step-through pattern;Decreased stride length;Trunk flexed Gait velocity: Decreased   General Gait Details: cues for  posture; guarded but steady gait with bilat UE support   Stairs Stairs: Yes Stairs assistance: Min guard Stair Management: Two rails;Step to pattern;Forwards Number of Stairs: 3 General stair comments: cues for sequencing and min guard for safety; practiced steps simulating home entrance   Wheelchair Mobility    Modified Rankin (Stroke Patients Only)       Balance Overall balance assessment: Needs assistance Sitting-balance support: Feet supported;No upper extremity supported Sitting balance-Leahy Scale: Good     Standing balance support: No upper extremity supported;During functional activity Standing balance-Leahy Scale: Fair Standing balance comment: pt is able to static stand without UE support                             Cognition Arousal/Alertness: Awake/alert Behavior During Therapy: WFL for tasks assessed/performed Overall Cognitive Status: Within Functional Limits for tasks assessed                                        Exercises      General Comments        Pertinent Vitals/Pain Pain Assessment: Faces Faces Pain Scale: Hurts a little bit Pain Location: hands (pt thinks he broke fall with hands) Pain Descriptors / Indicators: Grimacing;Sore Pain Intervention(s): Monitored during session    Home Living                      Prior Function            PT Goals (current goals can now  be found in the care plan section) Acute Rehab PT Goals Patient Stated Goal: Go home - back to PLOF Progress towards PT goals: Progressing toward goals    Frequency    Min 3X/week      PT Plan Current plan remains appropriate    Co-evaluation              AM-PAC PT "6 Clicks" Mobility   Outcome Measure  Help needed turning from your back to your side while in a flat bed without using bedrails?: None Help needed moving from lying on your back to sitting on the side of a flat bed without using bedrails?: None Help  needed moving to and from a bed to a chair (including a wheelchair)?: A Little Help needed standing up from a chair using your arms (e.g., wheelchair or bedside chair)?: None Help needed to walk in hospital room?: A Little Help needed climbing 3-5 steps with a railing? : A Little 6 Click Score: 21    End of Session Equipment Utilized During Treatment: Gait belt Activity Tolerance: Patient tolerated treatment well Patient left: with call bell/phone within reach;in bed Nurse Communication: Mobility status PT Visit Diagnosis: Unsteadiness on feet (R26.81);Muscle weakness (generalized) (M62.81);History of falling (Z91.81)     Time: 1779-3903 PT Time Calculation (min) (ACUTE ONLY): 33 min  Charges:  $Gait Training: 23-37 mins                     Mitchell Hunter, PTA Acute Rehabilitation Services Pager: [phone] Office: [phone]     Mitchell Hunter 07/03/2018, 10:06 AM

## 2018-07-03 NOTE — Discharge Summary (Signed)
Physician Discharge Summary  Mitchell Hunter HLK:[id number] DOB: 1945-06-05 DOA: 07/01/2018  PCP: Christain Sacramento, MD  Admit date: 07/01/2018 Discharge date: 07/03/2018  Admitted From: Home Disposition:  Home  Discharge Condition:Stable CODE STATUS:FULL Diet recommendation: Heart Healthy  Brief/Interim Summary: Patient is a 73 year old male with history of tobacco abuse/chronic alcohol abuse, hypertension, diabetes type 2, CKD stage IIwho was brought to the emergency department after he fell at home.  He was feeling weak, dizzy since last several days and also had very poor oral intake.  Denies any fever, chills, cough or shortness of breath on presentation.  Work-up in the emergency department showed acute kidney injury, possible urinary tract infection.  Imagings were negative for fracture or dislocation. Started on IV fluids with significant improvement in the kidney function.  Patient was seen by physical therapist and recommended home health on discharge. He is hemodynamically stable for discharge today to home.  Following problems were addressed during his hospitalization:  Acute kidney injury on CKD stage 2 : Most likely secondary to decreased oral intake.  Patient was dehydrated on presentation.  Continue IV fluids for today.  Kidney function improved and is on baseline.  [place].  He usually walks fine without any assistance as per the patient.  Was feeling dizzy at home.  Imagings done here on presentation did not show any fracture or dislocation.    PT evaluation done, recommended home health on discharge Tobacco abuse: Smokes 1and  1/2 packs of cigarettes a day counseled for cessation.  Chronic alcohol abuse: Everyday drinker.  Usually drinks 2-3 drinks a day, hard liquor.  Counseled for cessation.  Start on thiamine folic acid. Fall could be associated with alcohol.  Alcohol level not tested on presentation.  Hypokalemia: Supplemented  Hypertension: Hypertensive  this morning. Resumed home meds on discharge except for atenolol because of bradycardia .Started on amlodipine and hydralazine.  Continue to monitor blood pressure at home.  Diabetes type 2: On metformin at home.   Suspected urinary tract infection: Urine culture did not show any growth.   Discharge Diagnoses:  Active Problems:   AKI (acute kidney injury) ([place])   Pressure injury of skin    Discharge Instructions  Discharge Instructions    Diet - low sodium heart healthy   Complete by:  As directed    Discharge instructions   Complete by:  As directed    1)Follow up with your PCP in a week.  Do a BMP test during the follow-up to check your kidney function 2)Follow up with home health. 3)Take prescribed medications as instructed. 4) Stop alcohol consumption and smoking.   Increase activity slowly   Complete by:  As directed      Allergies as of 07/03/2018      Reactions   Adhesive [tape] Other (See Comments)   SKIN IS VERY THIN AND WILL TEAR EASILY- WILL NEED EITHER A TAPE ALTERNATIVE OR PAPER TAPE      Medication List    STOP taking these medications   atenolol 100 MG tablet Commonly known as:  TENORMIN     TAKE these medications   acetaminophen 650 MG CR tablet Commonly known as:  TYLENOL Take 650 mg by mouth every 8 (eight) hours as needed for pain.   allopurinol 100 MG tablet Commonly known as:  ZYLOPRIM Take 100 mg by mouth 2 (two) times daily.   ALPRAZolam 0.25 MG tablet Commonly known as:  XANAX Take 0.25-0.5 mg by mouth at bedtime as  needed for sleep.   amLODipine 10 MG tablet Commonly known as:  NORVASC Take 1 tablet (10 mg total) by mouth daily.   aspirin EC 81 MG tablet Take 81 mg by mouth daily.   benazepril-hydrochlorthiazide 20-12.5 MG tablet Commonly known as:  LOTENSIN HCT Take 1 tablet by mouth daily.   cetirizine 10 MG tablet Commonly known as:  ZYRTEC Take 10 mg by mouth daily.   famotidine 20 MG tablet Commonly known as:   PEPCID Take 20 mg by mouth daily.   Fish Oil 1000 MG Caps Take 1,000-2,000 mg by mouth 3 (three) times daily.   folic acid 1 MG tablet Commonly known as:  FOLVITE Take 1 tablet (1 mg total) by mouth daily.   gabapentin 600 MG tablet Commonly known as:  NEURONTIN Take 600 mg by mouth 2 (two) times daily.   HYDROcodone-acetaminophen 5-325 MG tablet Commonly known as:  NORCO/VICODIN Take 2 tablets by mouth 2 (two) times daily as needed (for back or neck pain).   metFORMIN 1000 MG tablet Commonly known as:  GLUCOPHAGE Take 1,000 mg by mouth 2 (two) times daily with a meal.   multivitamin capsule Take 1 capsule by mouth daily.   PRESERVISION AREDS 2 PO Take 1 capsule by mouth 2 (two) times daily.   promethazine 12.5 MG tablet Commonly known as:  PHENERGAN Take 12.5 mg by mouth every 8 (eight) hours as needed for nausea or vomiting.   thiamine 100 MG tablet Take 1 tablet (100 mg total) by mouth daily.   tiZANidine 4 MG tablet Commonly known as:  ZANAFLEX Take 4 mg by mouth every 6 (six) hours as needed for muscle spasms.   triamcinolone cream 0.1 % Commonly known as:  KENALOG Apply 1 application topically as needed (for rashes or irritation).            Durable Medical Equipment  (From admission, onward)         Start     Ordered   07/03/18 1051  For home use only DME Walker rolling  Once    Question:  Patient needs a walker to treat with the following condition  Answer:  Fall   07/03/18 [place]    Christain Sacramento, MD. Schedule an appointment as soon as possible for a visit in 1 week(s).   Specialty:  Family Medicine Contact information: [address] [place] [address] [phone]          Allergies  Allergen Reactions  . Adhesive [Tape] Other (See Comments)    SKIN IS VERY THIN AND WILL TEAR EASILY- WILL NEED EITHER A TAPE ALTERNATIVE OR PAPER TAPE    Consultations:  None   Procedures/Studies: Dg  Chest 2 View  Result Date: 07/01/2018 CLINICAL DATA:  Status post fall today. EXAM: CHEST - 2 VIEW COMPARISON:  CT chest 02/01/2018.  PA and lateral chest 07/22/2013. FINDINGS: Lung volumes are low but the lungs are clear. Heart size is normal. No pneumothorax or pleural fluid. No acute or focal bony abnormality. IMPRESSION: Negative chest. Electronically Signed   By: Inge Rise M.D.   On: 07/01/2018 12:10   Ct Head Wo Contrast  Result Date: 07/01/2018 CLINICAL DATA:  73 year old male with a history of fall EXAM: CT HEAD WITHOUT CONTRAST CT MAXILLOFACIAL WITHOUT CONTRAST CT CERVICAL SPINE WITHOUT CONTRAST TECHNIQUE: Multidetector CT imaging of the head, cervical spine, and maxillofacial structures were performed using the standard protocol without intravenous  contrast. Multiplanar CT image reconstructions of the cervical spine and maxillofacial structures were also generated. COMPARISON:  None. FINDINGS: CT HEAD FINDINGS Brain: No acute intracranial hemorrhage. No midline shift or mass effect. Gray-white differentiation maintained. Unremarkable appearance of the ventricular system. Patchy hypodensity in the bilateral periventricular white matter. Focal hypodensity in the anterior left basal ganglia, potentially a cyst or dilated perivascular space. Vascular: Unremarkable. Skull: No acute fracture.  No aggressive bone lesion identified. Sinuses/Orbits: Unremarkable appearance of the orbits. Mastoid air cells clear. No middle ear effusion. No significant sinus disease. Other: None CT MAXILLOFACIAL FINDINGS Osseous: No fracture or mandibular dislocation. No destructive process. Orbits: Unremarkable appearance of the orbits Sinuses: Unremarkable appearance of the sinuses. Dental amalgams with significant streak artifact. Soft tissues: Unremarkable soft tissues. No radiopaque foreign body or significant soft tissue swelling. CT CERVICAL SPINE FINDINGS Alignment: No subluxation. Skull base and vertebrae: No  skull base fracture. Craniocervical junction aligned. No acute fracture identified of the vertebral elements. Facets maintain alignment. Soft tissues and spinal canal: No hematoma of the canal. Soft tissues relatively unremarkable. Minimal atherosclerosis. Disc levels: C2-C3: No significant foraminal narrowing or canal narrowing. Bilateral facet disease. C3-C4: Moderate right foraminal narrowing secondary to uncovertebral joint disease and facet disease. No significant canal narrowing or left foraminal narrowing. C4-C5: No significant foraminal narrowing or canal narrowing. Bilateral facet disease. C5-C6: Posterior disc osteophyte complex with bilateral uncovertebral joint disease, with mild foraminal narrowing bilaterally. C6-C7: Posterior disc osteophyte complex mildly narrows the canal. Bilateral uncovertebral joint disease with no significant foraminal narrowing. C7-T1: No significant foraminal narrowing or canal narrowing with disc disease and bilateral uncovertebral joint disease. Upper chest: No acute finding. Other: None IMPRESSION: Head CT: No acute intracranial abnormality. Evidence of chronic microvascular ischemic disease. Maxillofacial CT: No acute fracture. Cervical CT: No acute fracture or malalignment of the cervical spine. Degenerative disease of the cervical spine, as above. Electronically Signed   By: Corrie Mckusick D.O.   On: 07/01/2018 12:35   Ct Cervical Spine Wo Contrast  Result Date: 07/01/2018 CLINICAL DATA:  73 year old male with a history of fall EXAM: CT HEAD WITHOUT CONTRAST CT MAXILLOFACIAL WITHOUT CONTRAST CT CERVICAL SPINE WITHOUT CONTRAST TECHNIQUE: Multidetector CT imaging of the head, cervical spine, and maxillofacial structures were performed using the standard protocol without intravenous contrast. Multiplanar CT image reconstructions of the cervical spine and maxillofacial structures were also generated. COMPARISON:  None. FINDINGS: CT HEAD FINDINGS Brain: No acute  intracranial hemorrhage. No midline shift or mass effect. Gray-white differentiation maintained. Unremarkable appearance of the ventricular system. Patchy hypodensity in the bilateral periventricular white matter. Focal hypodensity in the anterior left basal ganglia, potentially a cyst or dilated perivascular space. Vascular: Unremarkable. Skull: No acute fracture.  No aggressive bone lesion identified. Sinuses/Orbits: Unremarkable appearance of the orbits. Mastoid air cells clear. No middle ear effusion. No significant sinus disease. Other: None CT MAXILLOFACIAL FINDINGS Osseous: No fracture or mandibular dislocation. No destructive process. Orbits: Unremarkable appearance of the orbits Sinuses: Unremarkable appearance of the sinuses. Dental amalgams with significant streak artifact. Soft tissues: Unremarkable soft tissues. No radiopaque foreign body or significant soft tissue swelling. CT CERVICAL SPINE FINDINGS Alignment: No subluxation. Skull base and vertebrae: No skull base fracture. Craniocervical junction aligned. No acute fracture identified of the vertebral elements. Facets maintain alignment. Soft tissues and spinal canal: No hematoma of the canal. Soft tissues relatively unremarkable. Minimal atherosclerosis. Disc levels: C2-C3: No significant foraminal narrowing or canal narrowing. Bilateral facet disease. C3-C4: Moderate right foraminal narrowing secondary  to uncovertebral joint disease and facet disease. No significant canal narrowing or left foraminal narrowing. C4-C5: No significant foraminal narrowing or canal narrowing. Bilateral facet disease. C5-C6: Posterior disc osteophyte complex with bilateral uncovertebral joint disease, with mild foraminal narrowing bilaterally. C6-C7: Posterior disc osteophyte complex mildly narrows the canal. Bilateral uncovertebral joint disease with no significant foraminal narrowing. C7-T1: No significant foraminal narrowing or canal narrowing with disc disease and  bilateral uncovertebral joint disease. Upper chest: No acute finding. Other: None IMPRESSION: Head CT: No acute intracranial abnormality. Evidence of chronic microvascular ischemic disease. Maxillofacial CT: No acute fracture. Cervical CT: No acute fracture or malalignment of the cervical spine. Degenerative disease of the cervical spine, as above. Electronically Signed   By: Corrie Mckusick D.O.   On: 07/01/2018 12:35   Dg Hand Complete Left  Result Date: 07/01/2018 CLINICAL DATA:  Left hand injury in a fall today. Initial encounter. EXAM: LEFT HAND - COMPLETE 3+ VIEW COMPARISON:  None. FINDINGS: There is no acute bony or joint abnormality. Joint spaces are preserved. Soft tissues appear normal. IMPRESSION: Negative exam. Electronically Signed   By: Inge Rise M.D.   On: 07/01/2018 12:12   Dg Hand Complete Right  Result Date: 07/01/2018 CLINICAL DATA:  Right hand injury in a fall today. Initial encounter. EXAM: RIGHT HAND - COMPLETE 3+ VIEW COMPARISON:  None. FINDINGS: There is no acute bony or joint abnormality. No focal bony lesion. Joint spaces are preserved. Soft tissues appear normal. IMPRESSION: Negative exam. Electronically Signed   By: Inge Rise M.D.   On: 07/01/2018 12:11   Ct Maxillofacial Wo Cm  Result Date: 07/01/2018 CLINICAL DATA:  73 year old male with a history of fall EXAM: CT HEAD WITHOUT CONTRAST CT MAXILLOFACIAL WITHOUT CONTRAST CT CERVICAL SPINE WITHOUT CONTRAST TECHNIQUE: Multidetector CT imaging of the head, cervical spine, and maxillofacial structures were performed using the standard protocol without intravenous contrast. Multiplanar CT image reconstructions of the cervical spine and maxillofacial structures were also generated. COMPARISON:  None. FINDINGS: CT HEAD FINDINGS Brain: No acute intracranial hemorrhage. No midline shift or mass effect. Gray-white differentiation maintained. Unremarkable appearance of the ventricular system. Patchy hypodensity in the  bilateral periventricular white matter. Focal hypodensity in the anterior left basal ganglia, potentially a cyst or dilated perivascular space. Vascular: Unremarkable. Skull: No acute fracture.  No aggressive bone lesion identified. Sinuses/Orbits: Unremarkable appearance of the orbits. Mastoid air cells clear. No middle ear effusion. No significant sinus disease. Other: None CT MAXILLOFACIAL FINDINGS Osseous: No fracture or mandibular dislocation. No destructive process. Orbits: Unremarkable appearance of the orbits Sinuses: Unremarkable appearance of the sinuses. Dental amalgams with significant streak artifact. Soft tissues: Unremarkable soft tissues. No radiopaque foreign body or significant soft tissue swelling. CT CERVICAL SPINE FINDINGS Alignment: No subluxation. Skull base and vertebrae: No skull base fracture. Craniocervical junction aligned. No acute fracture identified of the vertebral elements. Facets maintain alignment. Soft tissues and spinal canal: No hematoma of the canal. Soft tissues relatively unremarkable. Minimal atherosclerosis. Disc levels: C2-C3: No significant foraminal narrowing or canal narrowing. Bilateral facet disease. C3-C4: Moderate right foraminal narrowing secondary to uncovertebral joint disease and facet disease. No significant canal narrowing or left foraminal narrowing. C4-C5: No significant foraminal narrowing or canal narrowing. Bilateral facet disease. C5-C6: Posterior disc osteophyte complex with bilateral uncovertebral joint disease, with mild foraminal narrowing bilaterally. C6-C7: Posterior disc osteophyte complex mildly narrows the canal. Bilateral uncovertebral joint disease with no significant foraminal narrowing. C7-T1: No significant foraminal narrowing or canal narrowing with disc disease  and bilateral uncovertebral joint disease. Upper chest: No acute finding. Other: None IMPRESSION: Head CT: No acute intracranial abnormality. Evidence of chronic microvascular  ischemic disease. Maxillofacial CT: No acute fracture. Cervical CT: No acute fracture or malalignment of the cervical spine. Degenerative disease of the cervical spine, as above. Electronically Signed   By: Corrie Mckusick D.O.   On: 07/01/2018 12:35      Subjective:  Patient seen and examined at the bedside this morning.  Remains comfortable.  Hemodynamically stable.  Mildly hypertensive.  Denies any complaints. Discharge Exam: Vitals:   07/03/18 0350 07/03/18 0902  BP: (!) 177/75 (!) 169/74  Pulse: (!) 58 (!) 55  Resp: 16 18  Temp: 97.7 F (36.5 C) 98.2 F (36.8 C)  SpO2: 97% 97%   Vitals:   07/02/18 1712 07/02/18 2106 07/03/18 0350 07/03/18 0902  BP: (!) 164/74 (!) 160/79 (!) 177/75 (!) 169/74  Pulse: 73 62 (!) 58 (!) 55  Resp:  18 16 18   Temp: 97.7 F (36.5 C) 98.3 F (36.8 C) 97.7 F (36.5 C) 98.2 F (36.8 C)  TempSrc: Oral Oral Oral Oral  SpO2: 97% 98% 97% 97%  Weight:      Height:        General: Pt is alert, awake, not in acute distress Cardiovascular: RRR, S1/S2 +, no rubs, no gallops Respiratory: CTA bilaterally, no wheezing, no rhonchi Abdominal: Soft, NT, ND, bowel sounds + Extremities: no edema, no cyanosis    The results of significant diagnostics from this hospitalization (including imaging, microbiology, ancillary and laboratory) are listed below for reference.     Microbiology: Recent Results (from the past 240 hour(s))  Urine culture     Status: None   Collection Time: 07/01/18  1:24 PM  Result Value Ref Range Status   Specimen Description URINE, RANDOM  Final   Special Requests NONE  Final   Culture   Final    NO GROWTH Performed at [facility], [address] [place], [place] [address]    Report Status 07/02/2018 FINAL  Final     Labs: BNP (last 3 results) No results for input(s): BNP in the last 8760 hours. Basic Metabolic Panel: Recent Labs  Lab 07/01/18 1126 07/02/18 0247 07/03/18 0341  NA 136 137 138  K 3.8 3.3* 3.6   CL 99 100 105  CO2 22 21* 22  GLUCOSE 103* 124* 116*  BUN 26* 22 15  CREATININE 2.37* 1.82* 1.52*  CALCIUM 9.1 8.8* 8.9   Liver Function Tests: Recent Labs  Lab 07/01/18 1126  AST 25  ALT 18  ALKPHOS 53  BILITOT 1.1  PROT 6.3*  ALBUMIN 3.5   No results for input(s): LIPASE, AMYLASE in the last 168 hours. No results for input(s): AMMONIA in the last 168 hours. CBC: Recent Labs  Lab 07/01/18 1126 07/02/18 0247  WBC 6.8 5.7  NEUTROABS 4.4 3.5  HGB 13.2 12.3*  HCT 40.8 36.8*  MCV 96.5 93.4  PLT 157 146*   Cardiac Enzymes: Recent Labs  Lab 07/01/18 1126  TROPONINI <0.03   BNP: Invalid input(s): POCBNP CBG: Recent Labs  Lab 07/02/18 0737 07/02/18 1159 07/02/18 1711 07/02/18 2105 07/03/18 0707  GLUCAP 130* 102* 195* 108* 113*   D-Dimer No results for input(s): DDIMER in the last 72 hours. Hgb A1c No results for input(s): HGBA1C in the last 72 hours. Lipid Profile No results for input(s): CHOL, HDL, LDLCALC, TRIG, CHOLHDL, LDLDIRECT in the last 72 hours. Thyroid function studies No  results for input(s): TSH, T4TOTAL, T3FREE, THYROIDAB in the last 72 hours.  Invalid input(s): FREET3 Anemia work up No results for input(s): VITAMINB12, FOLATE, FERRITIN, TIBC, IRON, RETICCTPCT in the last 72 hours. Urinalysis    Component Value Date/Time   COLORURINE AMBER (A) 07/01/2018 1324   APPEARANCEUR HAZY (A) 07/01/2018 1324   LABSPEC 1.024 07/01/2018 1324   PHURINE 5.0 07/01/2018 1324   GLUCOSEU NEGATIVE 07/01/2018 1324   HGBUR MODERATE (A) 07/01/2018 1324   BILIRUBINUR NEGATIVE 07/01/2018 1324   KETONESUR 20 (A) 07/01/2018 1324   PROTEINUR 100 (A) 07/01/2018 1324   NITRITE NEGATIVE 07/01/2018 1324   LEUKOCYTESUR NEGATIVE 07/01/2018 1324   Sepsis Labs Invalid input(s): PROCALCITONIN,  WBC,  LACTICIDVEN Microbiology Recent Results (from the past 240 hour(s))  Urine culture     Status: None   Collection Time: 07/01/18  1:24 PM  Result Value Ref Range  Status   Specimen Description URINE, RANDOM  Final   Special Requests NONE  Final   Culture   Final    NO GROWTH Performed at [facility], [place] [address], [place], [place] [address]    Report Status 07/02/2018 FINAL  Final    Please note: You were cared for by a hospitalist during your hospital stay. Once you are discharged, your primary care physician will handle any further medical issues. Please note that NO REFILLS for any discharge medications will be authorized once you are discharged, as it is imperative that you return to your primary care physician (or establish a relationship with a primary care physician if you do not have one) for your post hospital discharge needs so that they can reassess your need for medications and monitor your lab values.    Time coordinating discharge: 40 minutes  SIGNED:   Shelly Coss, MD  [facility] 07/03/2018, 10:52 AM Pager [phone]  If 7PM-7AM, please contact night-coverage [email or web address] Password [email or web address]

## 2018-07-03 NOTE — Care Management (Signed)
Spoke w patient over the phone, he chose [facility] from medicare quality list. [facility] rep accepted referral, and notified [employer] that patient will need RW prior to DC. No other CM needs identified.

## 2018-11-23 ENCOUNTER — Other Ambulatory Visit: Payer: Self-pay | Admitting: Nephrology

## 2018-11-23 DIAGNOSIS — N183 Chronic kidney disease, stage 3 unspecified: Secondary | ICD-10-CM

## 2018-12-02 ENCOUNTER — Ambulatory Visit
Admission: RE | Admit: 2018-12-02 | Discharge: 2018-12-02 | Disposition: A | Discharge status: Home / Self Care | Payer: Medicare Other | Source: Ambulatory Visit | Attending: Nephrology | Admitting: Nephrology

## 2018-12-02 DIAGNOSIS — N183 Chronic kidney disease, stage 3 unspecified: Secondary | ICD-10-CM

## 2018-12-28 ENCOUNTER — Other Ambulatory Visit (HOSPITAL_COMMUNITY): Payer: Self-pay | Admitting: Nephrology

## 2018-12-28 DIAGNOSIS — R319 Hematuria, unspecified: Secondary | ICD-10-CM

## 2018-12-28 DIAGNOSIS — N183 Chronic kidney disease, stage 3 unspecified: Secondary | ICD-10-CM

## 2018-12-28 DIAGNOSIS — R809 Proteinuria, unspecified: Secondary | ICD-10-CM

## 2019-01-06 HISTORY — PX: RENAL BIOPSY: SHX156

## 2019-01-10 ENCOUNTER — Other Ambulatory Visit: Payer: Self-pay | Admitting: Radiology

## 2019-01-11 ENCOUNTER — Encounter (HOSPITAL_COMMUNITY): Payer: Self-pay

## 2019-01-11 ENCOUNTER — Telehealth: Payer: Self-pay | Admitting: Radiology

## 2019-01-11 ENCOUNTER — Ambulatory Visit (HOSPITAL_COMMUNITY)
Admission: RE | Admit: 2019-01-11 | Discharge: 2019-01-11 | Disposition: A | Discharge status: Home / Self Care | Payer: Medicare Other | Source: Ambulatory Visit | Attending: Nephrology | Admitting: Nephrology

## 2019-01-11 ENCOUNTER — Ambulatory Visit (HOSPITAL_COMMUNITY)
Admission: RE | Admit: 2019-01-11 | Discharge: 2019-01-11 | Disposition: A | Discharge status: Home / Self Care | Payer: Medicare Other | Source: Ambulatory Visit | Attending: Radiology | Admitting: Radiology

## 2019-01-11 ENCOUNTER — Other Ambulatory Visit: Payer: Self-pay

## 2019-01-11 ENCOUNTER — Observation Stay (HOSPITAL_COMMUNITY)
Admission: RE | Admit: 2019-01-11 | Discharge: 2019-01-12 | Disposition: A | Discharge status: Home / Self Care | Payer: Medicare Other | Attending: Interventional Radiology | Admitting: Interventional Radiology

## 2019-01-11 DIAGNOSIS — F17210 Nicotine dependence, cigarettes, uncomplicated: Secondary | ICD-10-CM | POA: Insufficient documentation

## 2019-01-11 DIAGNOSIS — Z888 Allergy status to other drugs, medicaments and biological substances status: Secondary | ICD-10-CM | POA: Insufficient documentation

## 2019-01-11 DIAGNOSIS — N2889 Other specified disorders of kidney and ureter: Principal | ICD-10-CM | POA: Diagnosis present

## 2019-01-11 DIAGNOSIS — Z7984 Long term (current) use of oral hypoglycemic drugs: Secondary | ICD-10-CM | POA: Diagnosis not present

## 2019-01-11 DIAGNOSIS — Z7982 Long term (current) use of aspirin: Secondary | ICD-10-CM | POA: Diagnosis not present

## 2019-01-11 DIAGNOSIS — M109 Gout, unspecified: Secondary | ICD-10-CM | POA: Diagnosis not present

## 2019-01-11 DIAGNOSIS — R61 Generalized hyperhidrosis: Secondary | ICD-10-CM | POA: Insufficient documentation

## 2019-01-11 DIAGNOSIS — R52 Pain, unspecified: Secondary | ICD-10-CM

## 2019-01-11 DIAGNOSIS — N183 Chronic kidney disease, stage 3 unspecified: Secondary | ICD-10-CM | POA: Diagnosis not present

## 2019-01-11 DIAGNOSIS — F419 Anxiety disorder, unspecified: Secondary | ICD-10-CM | POA: Insufficient documentation

## 2019-01-11 DIAGNOSIS — Z20828 Contact with and (suspected) exposure to other viral communicable diseases: Secondary | ICD-10-CM | POA: Insufficient documentation

## 2019-01-11 DIAGNOSIS — R319 Hematuria, unspecified: Secondary | ICD-10-CM

## 2019-01-11 DIAGNOSIS — I129 Hypertensive chronic kidney disease with stage 1 through stage 4 chronic kidney disease, or unspecified chronic kidney disease: Secondary | ICD-10-CM | POA: Insufficient documentation

## 2019-01-11 DIAGNOSIS — I959 Hypotension, unspecified: Secondary | ICD-10-CM | POA: Insufficient documentation

## 2019-01-11 DIAGNOSIS — R1110 Vomiting, unspecified: Secondary | ICD-10-CM | POA: Insufficient documentation

## 2019-01-11 DIAGNOSIS — E785 Hyperlipidemia, unspecified: Secondary | ICD-10-CM | POA: Diagnosis not present

## 2019-01-11 DIAGNOSIS — R809 Proteinuria, unspecified: Secondary | ICD-10-CM

## 2019-01-11 DIAGNOSIS — E1122 Type 2 diabetes mellitus with diabetic chronic kidney disease: Secondary | ICD-10-CM | POA: Diagnosis not present

## 2019-01-11 LAB — BASIC METABOLIC PANEL
Anion gap: 9 (ref 5–15)
BUN: 25 mg/dL — ABNORMAL HIGH (ref 8–23)
CO2: 23 mmol/L (ref 22–32)
Calcium: 8.6 mg/dL — ABNORMAL LOW (ref 8.9–10.3)
Chloride: 109 mmol/L (ref 98–111)
Creatinine, Ser: 1.83 mg/dL — ABNORMAL HIGH (ref 0.61–1.24)
GFR calc Af Amer: 41 mL/min — ABNORMAL LOW (ref >60)
GFR calc non Af Amer: 36 mL/min — ABNORMAL LOW (ref >60)
Glucose, Bld: 168 mg/dL — ABNORMAL HIGH (ref 70–99)
Potassium: 5 mmol/L (ref 3.5–5.1)
Sodium: 141 mmol/L (ref 135–145)

## 2019-01-11 LAB — CBC
HCT: 38.1 % — ABNORMAL LOW (ref 39.0–52.0)
Hemoglobin: 12.6 g/dL — ABNORMAL LOW (ref 13.0–17.0)
MCH: 31 pg (ref 26.0–34.0)
MCHC: 33.1 g/dL (ref 30.0–36.0)
MCV: 93.6 fL (ref 80.0–100.0)
Platelets: 232 10*3/uL (ref 150–400)
RBC: 4.07 MIL/uL — ABNORMAL LOW (ref 4.22–5.81)
RDW: 15.7 % — ABNORMAL HIGH (ref 11.5–15.5)
WBC: 7.9 10*3/uL (ref 4.0–10.5)
nRBC: 0 % (ref 0.0–0.2)

## 2019-01-11 LAB — GLUCOSE, CAPILLARY
Glucose-Capillary: 129 mg/dL — ABNORMAL HIGH (ref 70–99)
Glucose-Capillary: 159 mg/dL — ABNORMAL HIGH (ref 70–99)
Glucose-Capillary: 145 mg/dL — ABNORMAL HIGH (ref 70–99)

## 2019-01-11 LAB — PROTIME-INR
INR: 1 (ref 0.8–1.2)
Prothrombin Time: 12.9 seconds (ref 11.4–15.2)

## 2019-01-11 MED ORDER — BENAZEPRIL HCL 40 MG PO TABS
20.0000 mg | ORAL_TABLET | Freq: Every day | ORAL | Status: DC
  Administered 2019-01-12: 20 mg via ORAL
  Filled 2019-01-11: qty 1

## 2019-01-11 MED ORDER — HYDROCODONE-ACETAMINOPHEN 5-325 MG PO TABS
ORAL_TABLET | ORAL | Status: AC
  Administered 2019-01-11: 2 via ORAL
  Filled 2019-01-11: qty 2

## 2019-01-11 MED ORDER — PANTOPRAZOLE SODIUM 20 MG PO TBEC: 20.0000 mg | DELAYED_RELEASE_TABLET | Freq: Every day | ORAL | Status: DC

## 2019-01-11 MED ORDER — LIP MEDEX EX OINT
1.0000 "application " | TOPICAL_OINTMENT | CUTANEOUS | Status: DC | PRN
  Filled 2019-01-11: qty 7

## 2019-01-11 MED ORDER — SALINE SPRAY 0.65 % NA SOLN
1.0000 | NASAL | Status: DC | PRN
  Filled 2019-01-11: qty 44

## 2019-01-11 MED ORDER — HYDROCORTISONE 1 % EX CREA
1.0000 "application " | TOPICAL_CREAM | Freq: Three times a day (TID) | CUTANEOUS | Status: DC | PRN
  Filled 2019-01-11: qty 28

## 2019-01-11 MED ORDER — AMLODIPINE BESYLATE 10 MG PO TABS: 10.0000 mg | ORAL_TABLET | Freq: Every day | ORAL | Status: DC

## 2019-01-11 MED ORDER — ONDANSETRON HCL 4 MG/2ML IJ SOLN
INTRAMUSCULAR | Status: AC
  Filled 2019-01-11: qty 2

## 2019-01-11 MED ORDER — AMLODIPINE BESYLATE 10 MG PO TABS
10.0000 mg | ORAL_TABLET | Freq: Every day | ORAL | Status: DC
  Administered 2019-01-12: 10 mg via ORAL
  Filled 2019-01-11: qty 1

## 2019-01-11 MED ORDER — METFORMIN HCL 500 MG PO TABS: 1000.0000 mg | ORAL_TABLET | Freq: Two times a day (BID) | ORAL | Status: DC

## 2019-01-11 MED ORDER — HYDROCODONE-ACETAMINOPHEN 5-325 MG PO TABS
1.0000 | ORAL_TABLET | ORAL | Status: AC
  Administered 2019-01-11: 12:00:00 2 via ORAL

## 2019-01-11 MED ORDER — FOLIC ACID 1 MG PO TABS
1.0000 mg | ORAL_TABLET | Freq: Every day | ORAL | Status: DC
  Administered 2019-01-12: 1 mg via ORAL
  Filled 2019-01-11 (×2): qty 1

## 2019-01-11 MED ORDER — PRIMIDONE 50 MG PO TABS
50.0000 mg | ORAL_TABLET | Freq: Every day | ORAL | Status: DC
  Administered 2019-01-11: 50 mg via ORAL
  Filled 2019-01-11: qty 1

## 2019-01-11 MED ORDER — FOLIC ACID 1 MG PO TABS: 1.0000 mg | ORAL_TABLET | Freq: Every day | ORAL | Status: DC

## 2019-01-11 MED ORDER — MIDAZOLAM HCL 2 MG/2ML IJ SOLN
INTRAMUSCULAR | Status: AC
  Filled 2019-01-11: qty 2

## 2019-01-11 MED ORDER — MIDAZOLAM HCL 2 MG/2ML IJ SOLN
INTRAMUSCULAR | Status: AC | PRN
  Administered 2019-01-11: 1 mg via INTRAVENOUS

## 2019-01-11 MED ORDER — ACETAMINOPHEN 325 MG PO TABS: 650.0000 mg | ORAL_TABLET | Freq: Three times a day (TID) | ORAL | Status: DC | PRN

## 2019-01-11 MED ORDER — LORATADINE 10 MG PO TABS: 10.0000 mg | ORAL_TABLET | Freq: Every day | ORAL | Status: DC

## 2019-01-11 MED ORDER — ALLOPURINOL 100 MG PO TABS: 100.0000 mg | ORAL_TABLET | Freq: Two times a day (BID) | ORAL | Status: DC

## 2019-01-11 MED ORDER — PANTOPRAZOLE SODIUM 20 MG PO TBEC
20.0000 mg | DELAYED_RELEASE_TABLET | Freq: Every day | ORAL | Status: DC
  Administered 2019-01-11 – 2019-01-12 (×2): 20 mg via ORAL
  Filled 2019-01-11 (×2): qty 1

## 2019-01-11 MED ORDER — GABAPENTIN 600 MG PO TABS: 600.0000 mg | ORAL_TABLET | Freq: Two times a day (BID) | ORAL | Status: DC

## 2019-01-11 MED ORDER — ALLOPURINOL 100 MG PO TABS
100.0000 mg | ORAL_TABLET | Freq: Two times a day (BID) | ORAL | Status: DC
  Administered 2019-01-11 – 2019-01-12 (×2): 100 mg via ORAL
  Filled 2019-01-11 (×2): qty 1

## 2019-01-11 MED ORDER — GELATIN ABSORBABLE 12-7 MM EX MISC
CUTANEOUS | Status: AC
  Filled 2019-01-11: qty 1

## 2019-01-11 MED ORDER — HYDRALAZINE HCL 20 MG/ML IJ SOLN
INTRAMUSCULAR | Status: AC | PRN
  Administered 2019-01-11: 10 mg via INTRAVENOUS

## 2019-01-11 MED ORDER — ALUM & MAG HYDROXIDE-SIMETH 200-200-20 MG/5ML PO SUSP: 30.0000 mL | ORAL | Status: DC | PRN

## 2019-01-11 MED ORDER — FENTANYL CITRATE (PF) 100 MCG/2ML IJ SOLN
INTRAMUSCULAR | Status: AC
  Filled 2019-01-11: qty 2

## 2019-01-11 MED ORDER — ONDANSETRON HCL 4 MG/2ML IJ SOLN
4.0000 mg | Freq: Once | INTRAMUSCULAR | Status: AC
  Administered 2019-01-11: 10:00:00 4 mg via INTRAVENOUS
  Filled 2019-01-11: qty 2

## 2019-01-11 MED ORDER — HYDRALAZINE HCL 20 MG/ML IJ SOLN
INTRAMUSCULAR | Status: AC
  Filled 2019-01-11: qty 1

## 2019-01-11 MED ORDER — PRIMIDONE 50 MG PO TABS: 50.0000 mg | ORAL_TABLET | Freq: Every day | ORAL | Status: DC

## 2019-01-11 MED ORDER — METFORMIN HCL 500 MG PO TABS
500.0000 mg | ORAL_TABLET | Freq: Two times a day (BID) | ORAL | Status: DC
  Administered 2019-01-12: 500 mg via ORAL
  Filled 2019-01-11: qty 1

## 2019-01-11 MED ORDER — ACETAMINOPHEN ER 650 MG PO TBCR: 650.0000 mg | EXTENDED_RELEASE_TABLET | Freq: Three times a day (TID) | ORAL | Status: DC | PRN

## 2019-01-11 MED ORDER — GUAIFENESIN-DM 100-10 MG/5ML PO SYRP: 5.0000 mL | ORAL_SOLUTION | ORAL | Status: DC | PRN

## 2019-01-11 MED ORDER — PHENOL 1.4 % MT LIQD: 1.0000 | OROMUCOSAL | Status: DC | PRN

## 2019-01-11 MED ORDER — GABAPENTIN 600 MG PO TABS
600.0000 mg | ORAL_TABLET | Freq: Two times a day (BID) | ORAL | Status: DC
  Administered 2019-01-11 – 2019-01-12 (×2): 600 mg via ORAL
  Filled 2019-01-11 (×2): qty 1

## 2019-01-11 MED ORDER — ALPRAZOLAM 0.25 MG PO TABS: 0.2500 mg | ORAL_TABLET | Freq: Every evening | ORAL | Status: DC | PRN

## 2019-01-11 MED ORDER — FENTANYL CITRATE (PF) 100 MCG/2ML IJ SOLN
INTRAMUSCULAR | Status: AC | PRN
  Administered 2019-01-11: 50 ug via INTRAVENOUS

## 2019-01-11 MED ORDER — SODIUM CHLORIDE 0.9 % IV SOLN
INTRAVENOUS | Status: DC
  Administered 2019-01-11: 09:00:00 via INTRAVENOUS

## 2019-01-11 MED ORDER — LIDOCAINE HCL (PF) 1 % IJ SOLN
INTRAMUSCULAR | Status: AC
  Filled 2019-01-11: qty 30

## 2019-01-11 MED ORDER — SODIUM CHLORIDE 0.9 % IV SOLN: Freq: Once | INTRAVENOUS | Status: DC

## 2019-01-11 MED ORDER — BENAZEPRIL HCL 20 MG PO TABS: 20.0000 mg | ORAL_TABLET | Freq: Every day | ORAL | Status: DC

## 2019-01-11 MED ORDER — SODIUM CHLORIDE 0.9 % IV SOLN: INTRAVENOUS | Status: DC

## 2019-01-11 MED ORDER — LORATADINE 10 MG PO TABS
10.0000 mg | ORAL_TABLET | Freq: Every day | ORAL | Status: DC
  Administered 2019-01-12: 10 mg via ORAL
  Filled 2019-01-11 (×2): qty 1

## 2019-01-11 MED ORDER — HYDROCORTISONE (PERIANAL) 2.5 % EX CREA
1.0000 "application " | TOPICAL_CREAM | Freq: Four times a day (QID) | CUTANEOUS | Status: DC | PRN
  Filled 2019-01-11: qty 28.35

## 2019-01-11 NOTE — H&P (Signed)
Chief Complaint: Patient was seen in consultation today for random renal biopsy at the request of [name],Mitchell Hunter  Referring Physician(s): [facility] Hunter  Supervising Physician: Mitchell Hunter  Patient Status: [facility] - Out-pt  History of Present Illness: Mitchell Hunter is a 73 y.o. male   DM; HTN; smoker  CKD3 Proteinuria; hematuria  Known symptoms x several months Work up per Dr Mitchell Hunter  Now for random renal biopsy  Past Medical History:  Diagnosis Date  . Anxiety   . Chronic renal insufficiency   . Diabetes mellitus without complication ([place])   . Gout   . Hyperlipidemia   . Hypertension   . Pneumonia    hx    Past Surgical History:  Procedure Laterality Date  . APPENDECTOMY    . ELBOW ARTHROSCOPY Right    torn cartilage  . KNEE ARTHROSCOPY Left    cartilage  . KNEE ARTHROSCOPY Right 07/22/2013   Procedure: ARTHROSCOPY KNEE;  Surgeon: Augustin Schooling, MD;  Location: [place];  Service: Orthopedics;  Laterality: Right;  . LUMBAR LAMINECTOMY/DECOMPRESSION MICRODISCECTOMY N/A 05/04/2013   Procedure: Right Lumbar Three-Four laminectomy and microdiskectomy ;  Surgeon: Hosie Spangle, MD;  Location: [facility];  Service: Neurosurgery;  Laterality: N/A;  Right Lumbar Three-Four laminectomy and microdiskectomy     Allergies: Adhesive [tape]  Medications: Prior to Admission medications   Medication Sig Start Date End Date Taking? Authorizing Provider  acetaminophen (TYLENOL) 650 MG CR tablet Take 650 mg by mouth every 8 (eight) hours as needed for pain.   Yes [provider]  allopurinol (ZYLOPRIM) 100 MG tablet Take 100 mg by mouth 2 (two) times daily. 04/19/13  Yes [provider]  ALPRAZolam (XANAX) 0.25 MG tablet Take 0.25-0.5 mg by mouth at bedtime as needed for sleep.    Yes [provider]  aspirin EC 81 MG tablet Take 81 mg by mouth daily.   Yes [provider]  benazepril (LOTENSIN) 20 MG tablet Take 20 mg by mouth  daily.   Yes [provider]  cetirizine (ZYRTEC) 10 MG tablet Take 10 mg by mouth daily.   Yes [provider]  folic acid (FOLVITE) 1 MG tablet Take 1 tablet (1 mg total) by mouth daily. 07/03/18  Yes Mitchell Coss, MD  gabapentin (NEURONTIN) 600 MG tablet Take 600 mg by mouth 2 (two) times daily.    Yes [provider]  HYDROcodone-acetaminophen (NORCO/VICODIN) 5-325 MG tablet Take 1 tablet by mouth 2 (two) times daily as needed (for back or neck pain).    Yes [provider]  metFORMIN (GLUCOPHAGE) 1000 MG tablet Take 1,000 mg by mouth 2 (two) times daily with a meal.   Yes [provider]  Multiple Vitamin (MULTIVITAMIN) capsule Take 1 capsule by mouth daily.   Yes [provider]  Multiple Vitamins-Minerals (PRESERVISION AREDS 2 PO) Take 1 capsule by mouth 2 (two) times daily.   Yes [provider]  Omega-3 Fatty Acids (FISH OIL) 1000 MG CAPS Take 1,000 mg by mouth 2 (two) times daily.    Yes [provider]  omeprazole (PRILOSEC) 20 MG capsule Take 20 mg by mouth daily.   Yes [provider]  primidone (MYSOLINE) 50 MG tablet Take 50 mg by mouth at bedtime. 10/05/18  Yes [provider]  thiamine 100 MG tablet Take 1 tablet (100 mg total) by mouth daily. 07/03/18  Yes Mitchell Coss, MD  triamcinolone cream (KENALOG) 0.1 % Apply 1 application topically as needed (  for rashes or irritation).  09/21/17  Yes [provider]  amLODipine (NORVASC) 10 MG tablet Take 1 tablet (10 mg total) by mouth daily. 07/03/18   Mitchell Coss, MD  promethazine (PHENERGAN) 12.5 MG tablet Take 12.5 mg by mouth every 8 (eight) hours as needed for nausea or vomiting.  01/27/18   [provider]     Family History  Problem Relation Age of Onset  . Dementia Mother   . Lung disease Father     Social History   Socioeconomic History  . Marital status: Married    Spouse name: Not on file  . Number of  children: 3  . Years of education: Not on file  . Highest education level: Not on file  Occupational History  . Not on file  Social Needs  . Financial resource strain: Not on file  . Food insecurity    Worry: Not on file    Inability: Not on file  . Transportation needs    Medical: Not on file    Non-medical: Not on file  Tobacco Use  . Smoking status: Current Every Day Smoker    Packs/day: 1.50    Years: 50.00    Pack years: 75.00    Types: Cigarettes  . Smokeless tobacco: Former Systems developer    Quit date: 02/23/1988  Substance and Sexual Activity  . Alcohol use: Yes    Comment: Daily; 3-4 drinks per day  . Drug use: No  . Sexual activity: Not on file  Lifestyle  . Physical activity    Days per week: Not on file    Minutes per session: Not on file  . Stress: Not on file  Relationships  . Social Herbalist on phone: Not on file    Gets together: Not on file    Attends religious service: Not on file    Active member of club or organization: Not on file    Attends meetings of clubs or organizations: Not on file    Relationship status: Not on file  Other Topics Concern  . Not on file  Social History Narrative  . Not on file     Review of Systems: A 12 point ROS discussed and pertinent positives are indicated in the HPI above.  All other systems are negative.  Review of Systems  Constitutional: Negative for activity change, fatigue and fever.  Respiratory: Negative for cough and shortness of breath.   Cardiovascular: Negative for chest pain.  Gastrointestinal: Negative for abdominal pain.  Neurological: Negative for weakness.  Psychiatric/Behavioral: Negative for behavioral problems and confusion.    Vital Signs: BP (!) 170/72   Pulse 83   Temp 98.1 F (36.7 C) (Skin)   Resp 14   Ht 6' (1.829 m)   Wt 165 lb (74.8 kg)   SpO2 100%   BMI 22.38 kg/m   Physical Exam Vitals signs reviewed.  Constitutional:      Appearance: Normal appearance.   Cardiovascular:     Rate and Rhythm: Normal rate and regular rhythm.     Heart sounds: Normal heart sounds.  Pulmonary:     Effort: Pulmonary effort is normal.     Breath sounds: Normal breath sounds.  Abdominal:     Palpations: Abdomen is soft.  Musculoskeletal: Normal range of motion.  Skin:    General: Skin is warm and dry.  Neurological:     Mental Status: He is alert and oriented to person, place, and time.  Psychiatric:  Mood and Affect: Mood normal.        Behavior: Behavior normal.        Thought Content: Thought content normal.        Judgment: Judgment normal.     Imaging: No results found.  Labs:  CBC: Recent Labs    07/01/18 1126 07/02/18 0247  WBC 6.8 5.7  HGB 13.2 12.3*  HCT 40.8 36.8*  PLT 157 146*    COAGS: No results for input(s): INR, APTT in the last 8760 hours.  BMP: Recent Labs    07/01/18 1126 07/02/18 0247 07/03/18 0341  NA 136 137 138  K 3.8 3.3* 3.6  CL 99 100 105  CO2 22 21* 22  GLUCOSE 103* 124* 116*  BUN 26* 22 15  CALCIUM 9.1 8.8* 8.9  CREATININE 2.37* 1.82* 1.52*  GFRNONAA 26* 36* 45*  GFRAA 30* 42* 52*    LIVER FUNCTION TESTS: Recent Labs    07/01/18 1126  BILITOT 1.1  AST 25  ALT 18  ALKPHOS 53  PROT 6.3*  ALBUMIN 3.5    TUMOR MARKERS: No results for input(s): AFPTM, CEA, CA199, CHROMGRNA in the last 8760 hours.  Assessment and Plan:  Proteinuria; hematuria Chronic kidney disease Scheduled for random renal biopsy Risks and benefits of random renal biopsy was discussed with the patient and/or patient's family including, but not limited to bleeding, infection, damage to adjacent structures or low yield requiring additional tests.  All of the questions were answered and there is agreement to proceed.  Consent signed and in chart.   Thank you for this interesting consult.  I greatly enjoyed meeting DANTEZ VERDUCCI and look forward to participating in their care.  A copy of this report was sent to  the requesting provider on this date.  Electronically Signed: Lavonia Drafts, PA-C 01/11/2019, 7:10 AM   I spent a total of  30 Minutes   in face to face in clinical consultation, greater than 50% of which was counseling/coordinating care for random renal bx

## 2019-01-11 NOTE — Discharge Instructions (Signed)
Percutaneous Kidney Biopsy  A kidney biopsy is a procedure to remove small pieces of tissue from a kidney. In a percutaneous biopsy, the tissue is removed using a needle that is inserted through the skin. This procedure is done so that the tissue can be examined under a microscope and checked for disease or infection. Tell a health care provider about:  Any allergies you have.  All medicines you are taking, including vitamins, herbs, eye drops, creams, and over-the-counter medicines.  Any problems you or family members have had with anesthetic medicines.  Any blood disorders you have.  Any surgeries you have had.  Any medical conditions you have.  Whether you are pregnant or may be pregnant. What are the risks? Generally, this is a safe procedure. However, problems may occur, including:  Infection.  Bleeding.  Allergic reactions to medicines.  Damage to other structures or organs.  Swelling from a collection of clotted blood outside a blood vessel (hematoma).  Blood in the urine (hematuria). What happens before the procedure?  Follow instructions from your health care provider about eating or drinking restrictions.  Ask your health care provider about: ? Changing or stopping your regular medicines. This is especially important if you are taking diabetes medicines or blood thinners. ? Taking medicines such as aspirin and ibuprofen. These medicines can thin your blood. Do not take these medicines before your procedure if your health care provider instructs you not to.  You may be given antibiotic medicine to help prevent infection.  You will have blood and urine samples taken. This is to make sure that you do not have a condition where you should not have a biopsy.  Plan to have someone take you home from the hospital or clinic.  Ask your health care provider how your biopsy site will be marked or identified. What happens during the procedure?  To lower your risk of  infection: ? Your health care team will wash or sanitize their hands. ? Your skin will be washed with soap.  An IV tube will be inserted into one of your veins.  You will be given one or more of the following: ? A medicine to help you relax (sedative). ? A medicine to numb the area (local anesthetic).  You will lie on your abdomen. A firm pillow will be placed under your body to help push the kidneys closer to the surface of the skin. If you have a transplanted kidney, you will lie on your back.  The health care provider will mark the area where the needle will enter your skin.  An imaging test--such as an ultrasound, X-ray, CT scan, or MRI--will be used to locate the kidney. These images will also help the health care provider to guide the biopsy needle into the kidney.  You will be asked to hold your breath and stay still while the health care provider inserts the needle and removes the kidney tissue. ? You will need to hold your breath and stay still for 30-45 seconds. ? During the biopsy, you may hear a popping sound from the needle. ? You may also feel some pressure from the area where the needle is being inserted.  The needle may be inserted and removed 3 or 4 times to make sure that enough tissue is taken for testing.  A bandage (dressing) may be placed over the spot where the needle entered your skin (biopsy site). The procedure may vary among health care providers and hospitals. What happens after the procedure?  Your blood pressure, heart rate, breathing rate, and blood oxygen level will be monitored until the medicines you were given have worn off.  You will need to lie on your back for 6-8 hours.  You may have some pain or soreness near the biopsy site.  You may have pink or cloudy urine from small amounts of blood. This is normal.  You may have grogginess or fatigue if you were given a sedative.  Do not drive for 24 hours if you were given a sedative.  It is up to  you to get the results of your procedure. Ask your health care provider, or the department performing the procedure, when your results will be ready. This information is not intended to replace advice given to you by your health care provider. Make sure you discuss any questions you have with your health care provider. Document Released: 02/02/2004 Document Revised: 03/06/2017 Document Reviewed: 01/04/2016 Elsevier Patient Education  2020 [employer].

## 2019-01-11 NOTE — Progress Notes (Addendum)
   Spoke to wife to update on procedure Hemorrhage and plan  She is aware And appreciative of call

## 2019-01-11 NOTE — Progress Notes (Cosign Needed Addendum)
Patient ID: Mitchell Hunter, male   DOB: [birth year], 73 y.o.   MRN: [id number]   Small left renal hemorrhage post random renal biopsy Pt is resting more comfortably now eating small amounts  Dr Earleen Newport aware of pt status He spoke to Dr Joelyn Oms  IR will arrange for observation 24 hrs  See orders Pt is aware  Plan for discharge in am if stable

## 2019-01-11 NOTE — Progress Notes (Signed)
Still waiting for obs bed. Pt VSS. Pain decrease to a 3.  Nausea resolved

## 2019-01-11 NOTE — Progress Notes (Signed)
Pt's VSS C/O pain radiating to his LL abd. Pam Turpin,PA aware.  See VS Nausea is improved since zofran per pt

## 2019-01-11 NOTE — Progress Notes (Signed)
Pam Turpin,PA returned to reassess pt .  VSS awaiting CT

## 2019-01-11 NOTE — Procedures (Signed)
Interventional Radiology Procedure Note  Procedure: US guided left medical renal biopsy. .  Complications: None Recommendations:  - Ok to shower tomorrow - Do not submerge for 7 days - 2 hours observation - advance diet - Routine care   Signed,  Dulcy Fanny. Earleen Newport, DO

## 2019-01-11 NOTE — Progress Notes (Signed)
Transferred to [id number].  Pt stable with pain of a 3.Denies nausea.  Ambulated to the BR without difficulty. No blood noted in urine

## 2019-01-11 NOTE — Progress Notes (Signed)
Jannifer Franklin ,PA here to assess pt.  Zofran ordered and given for nausea.  CT scan ordered stat.  CT scanners ar4e full per CT and they will call to bring pt  ASAP

## 2019-01-11 NOTE — Progress Notes (Signed)
Referring Physician(s): [place] B  Supervising Physician: Corrie Mckusick  Patient Status:  [facility]- OP  Chief Complaint:  abd and back pain Naesea post left renal biopsy  Subjective:  Called to pts room BP is wnl HR had come down in 30s-- now in 60s Diaphoretic Now vomiting  Pt states he was fine in procedure Didn't have pain til back upstairs Pain is all over but mostly in back   Allergies: Adhesive [tape]  Medications: Prior to Admission medications   Medication Sig Start Date End Date Taking? Authorizing Provider  acetaminophen (TYLENOL) 650 MG CR tablet Take 650 mg by mouth every 8 (eight) hours as needed for pain.   Yes [provider]  allopurinol (ZYLOPRIM) 100 MG tablet Take 100 mg by mouth 2 (two) times daily. 04/19/13  Yes [provider]  ALPRAZolam (XANAX) 0.25 MG tablet Take 0.25-0.5 mg by mouth at bedtime as needed for sleep.    Yes [provider]  aspirin EC 81 MG tablet Take 81 mg by mouth daily.   Yes [provider]  benazepril (LOTENSIN) 20 MG tablet Take 20 mg by mouth daily.   Yes [provider]  cetirizine (ZYRTEC) 10 MG tablet Take 10 mg by mouth daily.   Yes [provider]  folic acid (FOLVITE) 1 MG tablet Take 1 tablet (1 mg total) by mouth daily. 07/03/18  Yes Shelly Coss, MD  gabapentin (NEURONTIN) 600 MG tablet Take 600 mg by mouth 2 (two) times daily.    Yes [provider]  HYDROcodone-acetaminophen (NORCO/VICODIN) 5-325 MG tablet Take 1 tablet by mouth 2 (two) times daily as needed (for back or neck pain).    Yes [provider]  metFORMIN (GLUCOPHAGE) 1000 MG tablet Take 1,000 mg by mouth 2 (two) times daily with a meal.   Yes [provider]  Multiple Vitamin (MULTIVITAMIN) capsule Take 1 capsule by mouth daily.   Yes [provider]  Multiple Vitamins-Minerals (PRESERVISION AREDS 2 PO) Take 1 capsule by mouth 2 (two) times daily.   Yes  [provider]  Omega-3 Fatty Acids (FISH OIL) 1000 MG CAPS Take 1,000 mg by mouth 2 (two) times daily.    Yes [provider]  omeprazole (PRILOSEC) 20 MG capsule Take 20 mg by mouth daily.   Yes [provider]  primidone (MYSOLINE) 50 MG tablet Take 50 mg by mouth at bedtime. 10/05/18  Yes [provider]  thiamine 100 MG tablet Take 1 tablet (100 mg total) by mouth daily. 07/03/18  Yes Shelly Coss, MD  triamcinolone cream (KENALOG) 0.1 % Apply 1 application topically as needed (for rashes or irritation).  09/21/17  Yes [provider]  amLODipine (NORVASC) 10 MG tablet Take 1 tablet (10 mg total) by mouth daily. 07/03/18   Shelly Coss, MD  promethazine (PHENERGAN) 12.5 MG tablet Take 12.5 mg by mouth every 8 (eight) hours as needed for nausea or vomiting.  01/27/18   [provider]     Vital Signs: BP (!) 146/60   Pulse 60   Temp 98.1 F (36.7 C) (Skin)   Resp 16   Ht 6' (1.829 m)   Wt 165 lb (74.8 kg)   SpO2 (!) 75%   BMI 22.38 kg/m   Physical Exam Vitals signs reviewed.  Skin:    General: Skin is warm and dry.     Comments: Site of renal bx is clean and dry NT no bleeding no hematoma UOP is good-- urinated  in [facility] after procedure-- no blood noted  Neurological:     Mental Status: He is alert.     Imaging: No results found.  Labs:  CBC: Recent Labs    07/01/18 1126 07/02/18 0247 01/11/19 0639  WBC 6.8 5.7 7.9  HGB 13.2 12.3* 12.6*  HCT 40.8 36.8* 38.1*  PLT 157 146* 232    COAGS: Recent Labs    01/11/19 0639  INR 1.0    BMP: Recent Labs    07/01/18 1126 07/02/18 0247 07/03/18 0341  NA 136 137 138  K 3.8 3.3* 3.6  CL 99 100 105  CO2 22 21* 22  GLUCOSE 103* 124* 116*  BUN 26* 22 15  CALCIUM 9.1 8.8* 8.9  CREATININE 2.37* 1.82* 1.52*  GFRNONAA 26* 36* 45*  GFRAA 30* 42* 52*    LIVER FUNCTION TESTS: Recent Labs    07/01/18 1126  BILITOT 1.1  AST 25  ALT 18  ALKPHOS 53   PROT 6.3*  ALBUMIN 3.5    Assessment and Plan:  Back pain; nausea; vomiting after left renal biopsy Change in VS-- HR came down to 30s-- but now in 60s BNP wnl UOP good-- clear yellow  Will order CT Abd limited - post renal bx  Electronically Signed: Lavonia Drafts, PA-C 01/11/2019, 9:44 AM   I spent a total of 15 Minutes at the the patient's bedside AND on the patient's hospital floor or unit, greater than 50% of which was counseling/coordinating care for post renal biopsy back pain

## 2019-01-11 NOTE — Progress Notes (Signed)
Spoke with pt's wife to update her on situation. PA to call wife also.

## 2019-01-11 NOTE — Progress Notes (Signed)
NS started per NSL at 400cc.  Pt nauseated but HR and BP drop when HOB up.  Pt turned to side. Had dry heaves and is less nauseated.

## 2019-01-11 NOTE — Progress Notes (Signed)
Report called to Carla,RN on 32M

## 2019-01-11 NOTE — Progress Notes (Signed)
Pt received from radiology in 8 of 10 pain at (L) flank.  C/) nausea and squeezing stabbing pain at (L) biopsy site.  Bandaid intact . No obvious swelling to (L) flank area.  Pt very clammy and pale. Diaphoretic.  BP 144/81 HR 60 but dropped to 34.  Pt placed in trendelenburg. O2 applied at [facility] . Cool rag to face.  Pt's HR came up to 60s. Dr Earleen Newport and Jannifer Franklin, PA paged.

## 2019-01-11 NOTE — Progress Notes (Signed)
Spoke with pt placement.  Still waiting for obs bed

## 2019-01-11 NOTE — Progress Notes (Addendum)
Patient ID: Mitchell Hunter, male   DOB: [birth year], 73 y.o.   MRN: [id number]   CT reviewed with Dr Earleen Newport Small hemorrhage at Left renal site is noted No active bleeding  Pt is better Less pain Less nausea  VSS stable  Will give some po pain meds Vicodin 1 po now 1 po in 30 minutes  Will hold til 100 pm  Pt aware

## 2019-01-11 NOTE — Progress Notes (Signed)
Paged Dr Earleen Newport and Jannifer Franklin PA  Per Lattie Haw M,Rn

## 2019-01-11 NOTE — H&P (Deleted)
  The note originally documented on this encounter has been moved the the encounter in which it belongs.  

## 2019-01-11 NOTE — Progress Notes (Signed)
Accompanied pt to CT.Marland Kitchen Returned to room without incident. VSS pt reports pain is the same

## 2019-01-11 NOTE — Progress Notes (Signed)
No needs

## 2019-01-12 ENCOUNTER — Encounter (HOSPITAL_COMMUNITY): Payer: Self-pay | Admitting: *Deleted

## 2019-01-12 ENCOUNTER — Other Ambulatory Visit: Payer: Self-pay

## 2019-01-12 DIAGNOSIS — N2889 Other specified disorders of kidney and ureter: Secondary | ICD-10-CM | POA: Diagnosis not present

## 2019-01-12 LAB — SARS CORONAVIRUS 2 (TAT 6-24 HRS): SARS Coronavirus 2: NEGATIVE

## 2019-01-12 LAB — GLUCOSE, CAPILLARY: Glucose-Capillary: 134 mg/dL — ABNORMAL HIGH (ref 70–99)

## 2019-01-12 LAB — HEMOGLOBIN AND HEMATOCRIT, BLOOD
HCT: 32.7 % — ABNORMAL LOW (ref 39.0–52.0)
Hemoglobin: 10.8 g/dL — ABNORMAL LOW (ref 13.0–17.0)

## 2019-01-12 NOTE — Discharge Summary (Signed)
Patient ID: Mitchell Hunter MRN: [id number] DOB/AGE: 73-22-1947 73 y.o.  Admit date: 01/11/2019 Discharge date: 01/12/2019  Supervising Physician: Corrie Mckusick  Patient Status: [facility] - In-pt  Admission Diagnoses: Left renal hemorrhage  Discharge Diagnoses:  Active Problems:   Renal hemorrhage, left   Discharged Condition: stable  Hospital Course: Mitchell Hunter is a 73 year old male with past medical history of DM, HTN, current smoker who was referred to IR by Dr. Joelyn Oms for a left renal biopsy for worsening renal function.  He underwent procedure yesterday which was complicated by a small left renal hemorrhage.  Patient was symptomatic in recovery room with hypotension, diaphoresis, and vomiting. CT Abdomen Pelvis confirmed presence of hemorrhage; thought to be small however size estimation difficult due to lack of contrast. He was admitted overnight for observation.  He remained stable with conservative management. He did not require any pain medicine overnight.  He is found to be in stable condition this AM.  BP 163/78, HR 88.  Afebrile.  No acute event per nursing documentation. Patient states he is feeling well this morning.  States he has some generalized soreness, but no acute pain.  He has urinated clear, yellow urine.  Ambulating without difficulty.  Hgb dropped to 10.1 without evidence of further bleeding.  Discussed with Dr. Earleen Newport and Dr. Vernard Gambles, patient appropriate for discharge home today. No follow-up with IR needed unless new/worsening symptoms at home. Patient given care and discharge instructions.   Consults: None  Discharge Exam: There were no vitals taken for this visit. General appearance: alert, cooperative and no distress Resp: clear to auscultation bilaterally Cardio: regular rate and rhythm, S1, S2 normal, no murmur, click, rub or gallop GI: soft, non-tender; bowel sounds normal; no masses,  no organomegaly Skin: Skin color, texture, turgor normal. No rashes  or lesions.  Puncture site to left flank intact.  No evidence of bleeding. Bandaid in place.   Disposition: Discharge disposition: 01-Home or Self Care       Discharge Instructions    Call MD for:  difficulty breathing, headache or visual disturbances   Complete by: As directed    Call MD for:  persistant dizziness or light-headedness   Complete by: As directed    Call MD for:  persistant nausea and vomiting   Complete by: As directed    Call MD for:  redness, tenderness, or signs of infection (pain, swelling, redness, odor or green/yellow discharge around incision site)   Complete by: As directed    Call MD for:  severe uncontrolled pain   Complete by: As directed    Call MD for:  temperature >100.4   Complete by: As directed    Diet - low sodium heart healthy   Complete by: As directed    Discharge instructions   Complete by: As directed    Increase activity slowly.   No heavy lifting for the next few days of recovery.  May take pain medication as needed.  Use ice/heat or rest as needed.  Replace/remove bandaid as needed.  No follow-up needed in Interventional Radiology at this time. Call the office at [phone] if questions or concerns.   Increase activity slowly   Complete by: As directed      Allergies as of 01/12/2019      Reactions   Adhesive [tape] Other (See Comments)   SKIN IS VERY THIN AND WILL TEAR EASILY- WILL NEED EITHER A TAPE ALTERNATIVE OR PAPER TAPE  Medication List    TAKE these medications   acetaminophen 650 MG CR tablet Commonly known as: TYLENOL Take 650 mg by mouth every 8 (eight) hours as needed for pain.   allopurinol 100 MG tablet Commonly known as: ZYLOPRIM Take 100 mg by mouth 2 (two) times daily.   ALPRAZolam 0.25 MG tablet Commonly known as: XANAX Take 0.25-0.5 mg by mouth at bedtime as needed for sleep.   amLODipine 10 MG tablet Commonly known as: NORVASC Take 1 tablet (10 mg total) by mouth daily.   aspirin EC 81 MG  tablet Take 81 mg by mouth daily.   benazepril 20 MG tablet Commonly known as: LOTENSIN Take 20 mg by mouth daily.   cetirizine 10 MG tablet Commonly known as: ZYRTEC Take 10 mg by mouth daily.   Fish Oil 1000 MG Caps Take 1,000 mg by mouth 2 (two) times daily.   folic acid 1 MG tablet Commonly known as: FOLVITE Take 1 tablet (1 mg total) by mouth daily.   gabapentin 600 MG tablet Commonly known as: NEURONTIN Take 600 mg by mouth 2 (two) times daily.   HYDROcodone-acetaminophen 5-325 MG tablet Commonly known as: NORCO/VICODIN Take 1 tablet by mouth 2 (two) times daily as needed (for back or neck pain).   metFORMIN 1000 MG tablet Commonly known as: GLUCOPHAGE Take 1,000 mg by mouth 2 (two) times daily with a meal.   multivitamin capsule Take 1 capsule by mouth daily.   omeprazole 20 MG capsule Commonly known as: PRILOSEC Take 20 mg by mouth daily.   PRESERVISION AREDS 2 PO Take 1 capsule by mouth 2 (two) times daily.   primidone 50 MG tablet Commonly known as: MYSOLINE Take 50 mg by mouth at bedtime.   promethazine 12.5 MG tablet Commonly known as: PHENERGAN Take 12.5 mg by mouth every 8 (eight) hours as needed for nausea or vomiting.   thiamine 100 MG tablet Take 1 tablet (100 mg total) by mouth daily.   triamcinolone cream 0.1 % Commonly known as: KENALOG Apply 1 application topically as needed (for rashes or irritation).      Follow-up Information    Rexene Agent, MD .   Specialty: Nephrology Contact information: [address] [place] [address] [phone]            Electronically Signed: Docia Barrier, PA 01/12/2019, 10:36 AM   I have spent Greater Than 30 Minutes discharging Mitchell Hunter.

## 2019-01-12 NOTE — Progress Notes (Signed)
DISCHARGE NOTE [name] to be Mitchell Hunter per MD order. Discussed prescriptions and follow up appointments with the patient. Prescriptions given to patient; medication list explained in detail. Patient verbalized understanding.  Skin clean, dry and intact without evidence of skin break down, no evidence of skin tears noted. IV catheter discontinued intact. Site without signs and symptoms of complications. Dressing and pressure applied. Pt denies pain at the site currently. No complaints noted.  Patient free of lines, drains, and wounds.   An After Visit Summary (AVS) was printed and given to the patient. Patient escorted via wheelchair, and Mitchell Hunter via private auto.  Orville Govern, RN3

## 2019-01-25 ENCOUNTER — Encounter (HOSPITAL_COMMUNITY): Payer: Self-pay

## 2019-02-03 LAB — SURGICAL PATHOLOGY

## 2019-03-01 ENCOUNTER — Encounter (HOSPITAL_COMMUNITY): Payer: Self-pay

## 2019-06-20 ENCOUNTER — Ambulatory Visit: Payer: Medicare Other | Admitting: Diagnostic Neuroimaging

## 2019-06-20 ENCOUNTER — Encounter: Payer: Self-pay | Admitting: *Deleted

## 2019-06-20 ENCOUNTER — Other Ambulatory Visit: Payer: Self-pay

## 2019-06-20 ENCOUNTER — Telehealth: Payer: Self-pay | Admitting: Diagnostic Neuroimaging

## 2019-06-20 ENCOUNTER — Other Ambulatory Visit: Payer: Self-pay | Admitting: *Deleted

## 2019-06-20 VITALS — BP 168/92 | HR 96 | Temp 97.1°F | Ht 72.0 in | Wt 171.0 lb

## 2019-06-20 DIAGNOSIS — G20 Parkinson's disease: Secondary | ICD-10-CM

## 2019-06-20 MED ORDER — CARBIDOPA-LEVODOPA 25-100 MG PO TABS: 1.0000 | ORAL_TABLET | Freq: Three times a day (TID) | ORAL | 6 refills | Status: DC

## 2019-06-20 NOTE — Progress Notes (Signed)
[facility]  PATIENT: Mitchell Hunter DOB: [birth year]  REFERRING CLINICIAN: Aletha Halim., PA-C HISTORY FROM: patient and wife  REASON FOR VISIT: new consult    HISTORICAL  CHIEF COMPLAINT:  Chief Complaint  Patient presents with  . Tremors    rm 6 New Pt wife- Apolonio Schneiders "tremors gradually getting worse especially in last 3-4 months"  . Balance problems    HISTORY OF PRESENT ILLNESS:   74 year old male here for evaluation of tremor and gait difficulty.  For past 6 months patient has had gradual onset progressive intermittent tremor in his arms, at rest, with action and holding things.  Symptoms worse over time.  Symptoms are bilateral and symmetric.  Also has noted gait and balance difficulty, short steps, shuffling gait, soft voice.  Has had some decline in taste ability over the years.  Patient was tried on primidone for 4 to 6 weeks without benefit.  Patient's brother also was recently diagnosed with Parkinson's disease.   REVIEW OF SYSTEMS: Full 14 system review of systems performed and negative with exception of: As per HPI.  ALLERGIES: Allergies  Allergen Reactions  . Adhesive [Tape] Other (See Comments)    SKIN IS VERY THIN AND WILL TEAR EASILY- WILL NEED EITHER A TAPE ALTERNATIVE OR PAPER TAPE    HOME MEDICATIONS: Outpatient Medications Prior to Visit  Medication Sig Dispense Refill  . acetaminophen (TYLENOL) 650 MG CR tablet Take 650 mg by mouth every 8 (eight) hours as needed for pain.    Marland Kitchen allopurinol (ZYLOPRIM) 100 MG tablet Take 100 mg by mouth 2 (two) times daily.    Marland Kitchen ALPRAZolam (XANAX) 0.25 MG tablet Take 0.25-0.5 mg by mouth at bedtime as needed for sleep.     Marland Kitchen amLODipine (NORVASC) 10 MG tablet Take 1 tablet (10 mg total) by mouth daily. 30 tablet 0  . aspirin EC 81 MG tablet Take 81 mg by mouth daily.    . benazepril (LOTENSIN) 20 MG tablet Take 20 mg by mouth daily.    . cetirizine (ZYRTEC) 10 MG tablet Take 10 mg by mouth daily.     . Cholecalciferol 1.25 MG (50000 UT) capsule Take one pill every 7 days (weekly)    . escitalopram (LEXAPRO) 10 MG tablet Take 10 mg by mouth daily.    . folic acid (FOLVITE) 1 MG tablet Take 1 tablet (1 mg total) by mouth daily. 30 tablet 0  . gabapentin (NEURONTIN) 600 MG tablet Take 600 mg by mouth 2 (two) times daily.     Marland Kitchen HYDROcodone-acetaminophen (NORCO/VICODIN) 5-325 MG tablet Take 1 tablet by mouth 2 (two) times daily as needed (for back or neck pain).     . Multiple Vitamin (MULTIVITAMIN) capsule Take 1 capsule by mouth daily.    . Multiple Vitamins-Minerals (PRESERVISION AREDS 2 PO) Take 1 capsule by mouth 2 (two) times daily.    . Omega-3 Fatty Acids (FISH OIL) 1000 MG CAPS Take 1,000 mg by mouth 2 (two) times daily.     Marland Kitchen omeprazole (PRILOSEC) 20 MG capsule Take 20 mg by mouth daily.    . promethazine (PHENERGAN) 12.5 MG tablet Take 12.5 mg by mouth every 8 (eight) hours as needed for nausea or vomiting.     . thiamine 100 MG tablet Take 1 tablet (100 mg total) by mouth daily. 30 tablet 0  . tiZANidine (ZANAFLEX) 4 MG capsule Take 4 mg by mouth 3 (three) times daily.    Marland Kitchen triamcinolone cream (KENALOG) 0.1 % Apply  1 application topically as needed (for rashes or irritation).     . metFORMIN (GLUCOPHAGE) 1000 MG tablet Take 1,000 mg by mouth 2 (two) times daily with a meal.    . primidone (MYSOLINE) 50 MG tablet Take 50 mg by mouth at bedtime.     No facility-administered medications prior to visit.    PAST MEDICAL HISTORY: Past Medical History:  Diagnosis Date  . Anxiety   . Chronic renal insufficiency   . COPD (chronic obstructive pulmonary disease) ([place])   . Diabetes mellitus without complication ([place])   . Gout   . Hyperlipidemia   . Hypertension   . Pneumonia    hx    PAST SURGICAL HISTORY: Past Surgical History:  Procedure Laterality Date  . APPENDECTOMY    . ELBOW ARTHROSCOPY Right    torn cartilage  . KNEE ARTHROSCOPY Left    cartilage  . KNEE ARTHROSCOPY  Right 07/22/2013   Procedure: ARTHROSCOPY KNEE;  Surgeon: Augustin Schooling, MD;  Location: [facility];  Service: Orthopedics;  Laterality: Right;  . LUMBAR LAMINECTOMY/DECOMPRESSION MICRODISCECTOMY N/A 05/04/2013   Procedure: Right Lumbar Three-Four laminectomy and microdiskectomy ;  Surgeon: Hosie Spangle, MD;  Location: [facility];  Service: Neurosurgery;  Laterality: N/A;  Right Lumbar Three-Four laminectomy and microdiskectomy   . RENAL BIOPSY  01/2019    FAMILY HISTORY: Family History  Problem Relation Age of Onset  . Dementia Mother        Alzheimer's  . Glaucoma Mother   . Lung disease Father   . Alcohol abuse Father     SOCIAL HISTORY: Social History   Socioeconomic History  . Marital status: Married    Spouse name: Apolonio Schneiders  . Number of children: 3  . Years of education: Not on file  . Highest education level: High school graduate  Occupational History    Comment: retired  Tobacco Use  . Smoking status: Current Every Day Smoker    Packs/day: 1.50    Years: 50.00    Pack years: 75.00    Types: Cigarettes  . Smokeless tobacco: Former Systems developer    Quit date: 02/23/1988  Substance and Sexual Activity  . Alcohol use: Yes    Comment: 06/20/19 Daily; 2 drinks per day  . Drug use: No  . Sexual activity: Not on file  Other Topics Concern  . Not on file  Social History Narrative   Lives with wife   Caffeine- coffee 1 c daily   Social Determinants of Health   Financial Resource Strain:   . Difficulty of Paying Living Expenses:   Food Insecurity:   . Worried About Charity fundraiser in the Last Year:   . Arboriculturist in the Last Year:   Transportation Needs:   . Film/video editor (Medical):   Marland Kitchen Lack of Transportation (Non-Medical):   Physical Activity:   . Days of Exercise per Week:   . Minutes of Exercise per Session:   Stress:   . Feeling of Stress :   Social Connections:   . Frequency of Communication with Friends and Family:   . Frequency of Social  Gatherings with Friends and Family:   . Attends Religious Services:   . Active Member of Clubs or Organizations:   . Attends Archivist Meetings:   Marland Kitchen Marital Status:   Intimate Partner Violence:   . Fear of Current or Ex-Partner:   . Emotionally Abused:   Marland Kitchen Physically Abused:   . Sexually Abused:  PHYSICAL EXAM  GENERAL EXAM/CONSTITUTIONAL: Vitals:  Vitals:   06/20/19 0938  BP: (!) 168/92  Pulse: 96  Temp: (!) 97.1 F (36.2 C)  Weight: 171 lb (77.6 kg)  Height: 6' (1.829 m)     Body mass index is 23.19 kg/m. Wt Readings from Last 3 Encounters:  06/20/19 171 lb (77.6 kg)  01/11/19 165 lb (74.8 kg)  07/01/18 165 lb 12.6 oz (75.2 kg)     Patient is in no distress; well developed, nourished and groomed; neck is supple  CARDIOVASCULAR:  Examination of carotid arteries is normal; no carotid bruits  Regular rate and rhythm, no murmurs  Examination of peripheral vascular system by observation and palpation is normal  EYES:  Ophthalmoscopic exam of optic discs and posterior segments is normal; no papilledema or hemorrhages  No exam data present  MUSCULOSKELETAL:  Gait, strength, tone, movements noted in Neurologic exam below  NEUROLOGIC: MENTAL STATUS:  No flowsheet data found.  awake, alert, oriented to person, place and time  recent and remote memory intact  normal attention and concentration  language fluent, comprehension intact, naming intact  fund of knowledge appropriate  CRANIAL NERVE:   2nd - no papilledema on fundoscopic exam  2nd, 3rd, 4th, 6th - pupils equal and reactive to light, visual fields full to confrontation, extraocular muscles intact, no nystagmus  5th - facial sensation symmetric  7th - facial strength symmetric  8th - hearing intact  9th - palate elevates symmetrically, uvula midline  11th - shoulder shrug symmetric  12th - tongue protrusion midline  MOTOR:   POSTURAL, ACTION AND RESTING TREMOR IN  BUE  BRADYKINESIA IN BUE AND BLE; NO COGWHEELING  normal bulk and tone, full strength in the BUE, BLE  SENSORY:   normal and symmetric to light touch, temperature, vibration  COORDINATION:   finger-nose-finger, fine finger movements normal  REFLEXES:   deep tendon reflexes TRACE and symmetric  GAIT/STATION:   narrow based gait; SHORT STEPS; UNSTEADY; DECR ARM SWING     DIAGNOSTIC DATA (LABS, IMAGING, TESTING) - I reviewed patient records, labs, notes, testing and imaging myself where available.  Lab Results  Component Value Date   WBC 7.9 01/11/2019   HGB 10.8 (L) 01/12/2019   HCT 32.7 (L) 01/12/2019   MCV 93.6 01/11/2019   PLT 232 01/11/2019      Component Value Date/Time   NA 141 01/11/2019 1335   K 5.0 01/11/2019 1335   CL 109 01/11/2019 1335   CO2 23 01/11/2019 1335   GLUCOSE 168 (H) 01/11/2019 1335   BUN 25 (H) 01/11/2019 1335   CREATININE 1.83 (H) 01/11/2019 1335   CALCIUM 8.6 (L) 01/11/2019 1335   PROT 6.3 (L) 07/01/2018 1126   ALBUMIN 3.5 07/01/2018 1126   AST 25 07/01/2018 1126   ALT 18 07/01/2018 1126   ALKPHOS 53 07/01/2018 1126   BILITOT 1.1 07/01/2018 1126   GFRNONAA 36 (L) 01/11/2019 1335   GFRAA 41 (L) 01/11/2019 1335   No results found for: CHOL, HDL, LDLCALC, LDLDIRECT, TRIG, CHOLHDL No results found for: HGBA1C No results found for: VITAMINB12 No results found for: TSH     ASSESSMENT AND PLAN  74 y.o. year old male here with resting tremor, action and postural tremor, bradykinesia, postural instability, since 2020.  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  Will check MRI brain to rule out other secondary cause.  Will start trial of carbidopa levodopa.  Dx:  1. Parkinsonism, unspecified Parkinsonism type ([place])   2.  Parkinson's disease ([place])     PLAN:  - check MRI brain - start carb/levo half tab three times a day; after 2 weeks increase to 1 tab three times a day  - consider PT/OT/ST evals  Orders  Placed This Encounter  Procedures  . MR BRAIN W WO CONTRAST   Meds ordered this encounter  Medications  . carbidopa-levodopa (SINEMET IR) 25-100 MG tablet    Sig: Take 1 tablet by mouth 3 (three) times daily before meals.    Dispense:  90 tablet    Refill:  6   Return in about 4 months (around 10/20/2019).    Penni Bombard, MD 1/67/4255, 25:89 AM Certified in Neurology, Neurophysiology and Neuroimaging  [facility] [address], [place], [place] [address] [phone]

## 2019-06-20 NOTE — Telephone Encounter (Signed)
[employer] medicare order sent to GI. No auth they will reach out to the patient to schedule.  

## 2019-06-20 NOTE — Patient Instructions (Signed)
-   check MRI brain  - start carb/levo half tab three times a day with meals; after 2 weeks increase to 1 tab three times a day  - consider PT/OT/ST evaluation

## 2019-07-19 ENCOUNTER — Ambulatory Visit
Admission: RE | Admit: 2019-07-19 | Discharge: 2019-07-19 | Disposition: A | Discharge status: Home / Self Care | Payer: Medicare Other | Source: Ambulatory Visit | Attending: Diagnostic Neuroimaging | Admitting: Diagnostic Neuroimaging

## 2019-07-19 DIAGNOSIS — G20 Parkinson's disease: Secondary | ICD-10-CM | POA: Diagnosis not present

## 2019-07-19 MED ORDER — GADOBENATE DIMEGLUMINE 529 MG/ML IV SOLN
15.0000 mL | Freq: Once | INTRAVENOUS | Status: AC | PRN
  Administered 2019-07-19: 15 mL via INTRAVENOUS

## 2019-07-20 ENCOUNTER — Encounter: Payer: Self-pay | Admitting: *Deleted

## 2019-07-20 ENCOUNTER — Telehealth: Payer: Self-pay | Admitting: *Deleted

## 2019-07-20 NOTE — Telephone Encounter (Signed)
May increase carb/levo to 1.5 tabs three times a day for 2 weeks, then up to 2 tabs three times a day. -VRP

## 2019-07-20 NOTE — Telephone Encounter (Signed)
Spoke with wife, on [place] and informed her his MRI brain showed atrophy and chronic small vessel ischemic disease which can be age related and due to HTN, diabetes. She stated understanding, stated depending on the day, his tremors and balance don't seem to be any better. He's been taking carb/levo 1 tab three x daily x 2 weeks.  She stated they have increased stress now as well caring for her 74 year old mother. I advised will discuss possible medication increase with Dr Leta Baptist and let her know . She verbalized understanding, appreciation.

## 2019-07-20 NOTE — Telephone Encounter (Signed)
Called Mitchell Hunter and asked her to write down new Rx schedule. She got pen, paper and I gave her new carb/levo dosing schedule. She repeated correctly,  verbalized understanding, appreciation. I advised to call with any questions.

## 2019-08-01 ENCOUNTER — Other Ambulatory Visit: Payer: Self-pay | Admitting: *Deleted

## 2019-08-01 MED ORDER — CARBIDOPA-LEVODOPA 25-100 MG PO TABS: 2.0000 | ORAL_TABLET | Freq: Three times a day (TID) | ORAL | 5 refills | Status: DC

## 2019-10-18 ENCOUNTER — Ambulatory Visit: Payer: Medicare Other | Admitting: Diagnostic Neuroimaging

## 2019-10-18 ENCOUNTER — Encounter: Payer: Self-pay | Admitting: Diagnostic Neuroimaging

## 2019-10-18 VITALS — BP 122/70 | HR 76 | Ht 72.0 in | Wt 170.8 lb

## 2019-10-18 DIAGNOSIS — G20 Parkinson's disease: Secondary | ICD-10-CM

## 2019-10-18 MED ORDER — CARBIDOPA-LEVODOPA 25-100 MG PO TABS: 2.0000 | ORAL_TABLET | Freq: Three times a day (TID) | ORAL | 4 refills | Status: DC

## 2019-10-18 NOTE — Progress Notes (Signed)
[facility]  PATIENT: Mitchell Hunter DOB: 11-08-1945  REFERRING CLINICIAN: Christain Sacramento, MD HISTORY FROM: patient and wife  REASON FOR VISIT: follow up    HISTORICAL  CHIEF COMPLAINT:  Chief Complaint  Patient presents with  . Parkinsonism    rm 6, 4 month FU wife- Apolonio Schneiders  "wife says no improvement; weak spells; patient states symptoms vary each day; no falls"    HISTORY OF PRESENT ILLNESS:   UPDATE (10/18/19, VRP): Since last visit, doing about the same. Symptoms are stable. Severity is moderate. No alleviating or aggravating factors. Tolerating carb/levo 2 tabs three times a day.    PRIOR HPI (06/20/19): 74 year old male here for evaluation of tremor and gait difficulty.  For past 6 months patient has had gradual onset progressive intermittent tremor in his arms, at rest, with action and holding things.  Symptoms worse over time.  Symptoms are bilateral and symmetric.  Also has noted gait and balance difficulty, short steps, shuffling gait, soft voice.  Has had some decline in taste ability over the years.  Patient was tried on primidone for 4 to 6 weeks without benefit.  Patient's brother also was recently diagnosed with Parkinson's disease.   REVIEW OF SYSTEMS: Full 14 system review of systems performed and negative with exception of: As per HPI.  ALLERGIES: Allergies  Allergen Reactions  . Adhesive [Tape] Other (See Comments)    SKIN IS VERY THIN AND WILL TEAR EASILY- WILL NEED EITHER A TAPE ALTERNATIVE OR PAPER TAPE    HOME MEDICATIONS: Outpatient Medications Prior to Visit  Medication Sig Dispense Refill  . acetaminophen (TYLENOL) 650 MG CR tablet Take 650 mg by mouth every 8 (eight) hours as needed for pain.    Marland Kitchen allopurinol (ZYLOPRIM) 100 MG tablet Take 100 mg by mouth 2 (two) times daily.    Marland Kitchen ALPRAZolam (XANAX) 0.25 MG tablet Take 0.25-0.5 mg by mouth at bedtime as needed for sleep.     Marland Kitchen amLODipine (NORVASC) 10 MG tablet Take 1 tablet (10 mg  total) by mouth daily. 30 tablet 0  . aspirin EC 81 MG tablet Take 81 mg by mouth daily.    . benazepril (LOTENSIN) 20 MG tablet Take 20 mg by mouth daily.    . carbidopa-levodopa (SINEMET IR) 25-100 MG tablet Take 2 tablets by mouth 3 (three) times daily before meals. 180 tablet 5  . cetirizine (ZYRTEC) 10 MG tablet Take 10 mg by mouth daily.    . Cholecalciferol 1.25 MG (50000 UT) capsule Take one pill every 7 days (weekly)    . dapagliflozin propanediol (FARXIGA) 10 MG TABS tablet Take 1 tablet by mouth daily.    Marland Kitchen escitalopram (LEXAPRO) 10 MG tablet Take 10 mg by mouth daily.    . folic acid (FOLVITE) 1 MG tablet Take 1 tablet (1 mg total) by mouth daily. 30 tablet 0  . gabapentin (NEURONTIN) 600 MG tablet Take 600 mg by mouth 2 (two) times daily.     Marland Kitchen HYDROcodone-acetaminophen (NORCO/VICODIN) 5-325 MG tablet Take 1 tablet by mouth 2 (two) times daily as needed (for back or neck pain).     . Multiple Vitamin (MULTIVITAMIN) capsule Take 1 capsule by mouth daily.    . Multiple Vitamins-Minerals (PRESERVISION AREDS 2 PO) Take 1 capsule by mouth 2 (two) times daily.    . Omega-3 Fatty Acids (FISH OIL) 1000 MG CAPS Take 1,000 mg by mouth 2 (two) times daily.     Marland Kitchen omeprazole (PRILOSEC) 20 MG capsule  Take 20 mg by mouth daily.    . promethazine (PHENERGAN) 12.5 MG tablet Take 12.5 mg by mouth every 8 (eight) hours as needed for nausea or vomiting.     . thiamine 100 MG tablet Take 1 tablet (100 mg total) by mouth daily. 30 tablet 0  . tiZANidine (ZANAFLEX) 4 MG capsule Take 4 mg by mouth 3 (three) times daily.    Marland Kitchen triamcinolone cream (KENALOG) 0.1 % Apply 1 application topically as needed (for rashes or irritation).      No facility-administered medications prior to visit.    PAST MEDICAL HISTORY: Past Medical History:  Diagnosis Date  . Anxiety   . Chronic renal insufficiency   . COPD (chronic obstructive pulmonary disease) ([place])   . Diabetes mellitus without complication ([place])   .  Gout   . Hyperlipidemia   . Hypertension   . Parkinsonism ([place])   . Pneumonia    hx    PAST SURGICAL HISTORY: Past Surgical History:  Procedure Laterality Date  . APPENDECTOMY    . ELBOW ARTHROSCOPY Right    torn cartilage  . KNEE ARTHROSCOPY Left    cartilage  . KNEE ARTHROSCOPY Right 07/22/2013   Procedure: ARTHROSCOPY KNEE;  Surgeon: Augustin Schooling, MD;  Location: [facility];  Service: Orthopedics;  Laterality: Right;  . LUMBAR LAMINECTOMY/DECOMPRESSION MICRODISCECTOMY N/A 05/04/2013   Procedure: Right Lumbar Three-Four laminectomy and microdiskectomy ;  Surgeon: Hosie Spangle, MD;  Location: [facility];  Service: Neurosurgery;  Laterality: N/A;  Right Lumbar Three-Four laminectomy and microdiskectomy   . RENAL BIOPSY  01/2019    FAMILY HISTORY: Family History  Problem Relation Age of Onset  . Dementia Mother        Alzheimer's  . Glaucoma Mother   . Lung disease Father   . Alcohol abuse Father   . Parkinsonism Brother     SOCIAL HISTORY: Social History   Socioeconomic History  . Marital status: Married    Spouse name: Apolonio Schneiders  . Number of children: 3  . Years of education: Not on file  . Highest education level: High school graduate  Occupational History    Comment: retired  Tobacco Use  . Smoking status: Current Every Day Smoker    Packs/day: 1.50    Years: 50.00    Pack years: 75.00    Types: Cigarettes  . Smokeless tobacco: Former Systems developer    Quit date: 02/23/1988  Substance and Sexual Activity  . Alcohol use: Yes    Comment: 10/18/19 Daily; 2 drinks per day  . Drug use: No  . Sexual activity: Not on file  Other Topics Concern  . Not on file  Social History Narrative   Lives with wife   Caffeine- coffee 1 c daily   Social Determinants of Health   Financial Resource Strain:   . Difficulty of Paying Living Expenses:   Food Insecurity:   . Worried About Charity fundraiser in the Last Year:   . Arboriculturist in the Last Year:   Transportation  Needs:   . Film/video editor (Medical):   Marland Kitchen Lack of Transportation (Non-Medical):   Physical Activity:   . Days of Exercise per Week:   . Minutes of Exercise per Session:   Stress:   . Feeling of Stress :   Social Connections:   . Frequency of Communication with Friends and Family:   . Frequency of Social Gatherings with Friends and Family:   . Attends Religious Services:   .  Active Member of Clubs or Organizations:   . Attends Archivist Meetings:   Marland Kitchen Marital Status:   Intimate Partner Violence:   . Fear of Current or Ex-Partner:   . Emotionally Abused:   Marland Kitchen Physically Abused:   . Sexually Abused:      PHYSICAL EXAM  GENERAL EXAM/CONSTITUTIONAL: Vitals:  Vitals:   10/18/19 1133  BP: 122/70  Pulse: 76  Weight: 170 lb 12.8 oz (77.5 kg)  Height: 6' (1.829 m)   Body mass index is 23.16 kg/m. Wt Readings from Last 3 Encounters:  10/18/19 170 lb 12.8 oz (77.5 kg)  06/20/19 171 lb (77.6 kg)  01/11/19 165 lb (74.8 kg)    Patient is in no distress; well developed, nourished and groomed; neck is supple  CARDIOVASCULAR:  Examination of carotid arteries is normal; no carotid bruits  Regular rate and rhythm, no murmurs  Examination of peripheral vascular system by observation and palpation is normal  EYES:  Ophthalmoscopic exam of optic discs and posterior segments is normal; no papilledema or hemorrhages No exam data present  MUSCULOSKELETAL:  Gait, strength, tone, movements noted in Neurologic exam below  NEUROLOGIC: MENTAL STATUS:  No flowsheet data found.  awake, alert, oriented to person, place and time  recent and remote memory intact  normal attention and concentration  language fluent, comprehension intact, naming intact  fund of knowledge appropriate  CRANIAL NERVE:   2nd - no papilledema on fundoscopic exam  2nd, 3rd, 4th, 6th - pupils equal and reactive to light, visual fields full to confrontation, extraocular muscles  intact, no nystagmus  5th - facial sensation symmetric  7th - facial strength symmetric  8th - hearing intact  9th - palate elevates symmetrically, uvula midline  11th - shoulder shrug symmetric  12th - tongue protrusion midline  HOARSE VOICE  MOTOR:   MILD POSTURAL, ACTION AND RESTING TREMOR IN BUE  BRADYKINESIA IN BUE AND BLE; NO COGWHEELING  normal bulk and tone, full strength in the BUE, BLE  SENSORY:   normal and symmetric to light touch, temperature, vibration  COORDINATION:   finger-nose-finger, fine finger movements normal  REFLEXES:   deep tendon reflexes TRACE and symmetric  GAIT/STATION:   narrow based gait; SLIGHTLY UNSTEADY WITH TURNS; DECR ARM SWING     DIAGNOSTIC DATA (LABS, IMAGING, TESTING) - I reviewed patient records, labs, notes, testing and imaging myself where available.  Lab Results  Component Value Date   WBC 7.9 01/11/2019   HGB 10.8 (L) 01/12/2019   HCT 32.7 (L) 01/12/2019   MCV 93.6 01/11/2019   PLT 232 01/11/2019      Component Value Date/Time   NA 141 01/11/2019 1335   K 5.0 01/11/2019 1335   CL 109 01/11/2019 1335   CO2 23 01/11/2019 1335   GLUCOSE 168 (H) 01/11/2019 1335   BUN 25 (H) 01/11/2019 1335   CREATININE 1.83 (H) 01/11/2019 1335   CALCIUM 8.6 (L) 01/11/2019 1335   PROT 6.3 (L) 07/01/2018 1126   ALBUMIN 3.5 07/01/2018 1126   AST 25 07/01/2018 1126   ALT 18 07/01/2018 1126   ALKPHOS 53 07/01/2018 1126   BILITOT 1.1 07/01/2018 1126   GFRNONAA 36 (L) 01/11/2019 1335   GFRAA 41 (L) 01/11/2019 1335   No results found for: CHOL, HDL, LDLCALC, LDLDIRECT, TRIG, CHOLHDL No results found for: HGBA1C No results found for: VITAMINB12 No results found for: TSH   07/19/19 MRI brain -Moderate perisylvian atrophy and mild chronic small vessel ischemic disease. -No acute  findings.    ASSESSMENT AND PLAN  74 y.o. year old male here with resting tremor, action and postural tremor, bradykinesia, postural  instability, since 2020.  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  Will check MRI brain to rule out other secondary cause.  Will start trial of carbidopa levodopa.  Dx:  1. Parkinson's disease ([place])     PLAN:  - CONTINUE carb/levo 2 tabs three times a day  - refer to PT/OT/ST evals  Orders Placed This Encounter  Procedures  . Ambulatory referral to Physical Therapy  . Ambulatory referral to Occupational Therapy   Meds ordered this encounter  Medications  . carbidopa-levodopa (SINEMET IR) 25-100 MG tablet    Sig: Take 2 tablets by mouth 3 (three) times daily before meals.    Dispense:  540 tablet    Refill:  4   Return in about 9 months (around 07/18/2020).    Penni Bombard, MD 3/43/5686, 16:83 AM Certified in Neurology, Neurophysiology and Neuroimaging  [facility] [address] [place], [place] [address] [phone]

## 2020-04-09 ENCOUNTER — Other Ambulatory Visit: Payer: Self-pay | Admitting: Diagnostic Neuroimaging

## 2020-05-23 IMAGING — CT CT CHEST LUNG CANCER SCREENING LOW DOSE W/O CM
1 series · 15 of 33 positions shown, 19 images · non-contrast
Comparison: Chest radiograph 07/22/2013.  No prior CT.

CLINICAL DATA: Asymptomatic current smoker. Eighty-seven pack-year
history.

EXAM:
CT CHEST WITHOUT CONTRAST LOW-DOSE FOR LUNG CANCER SCREENING
TECHNIQUE: Multidetector CT imaging of the chest was performed following the
standard protocol without IV contrast.

[Series 2: ldct screening <30 bmi · axial · 0.75mm/px · z∈[-358,-94]mm · 15 of 63 slices shown, 19 images]
[im 5/63  mediastinal]
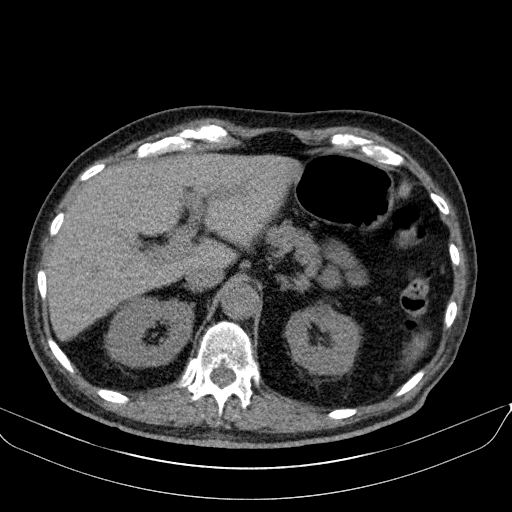
[im 5/63  lung]
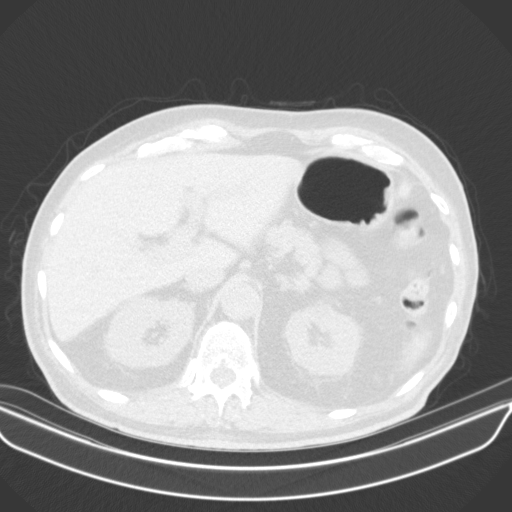
[im 10/63  lung]
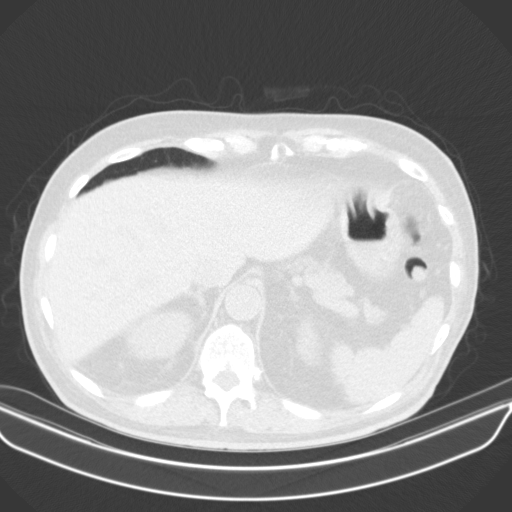
[im 13/63  lung]
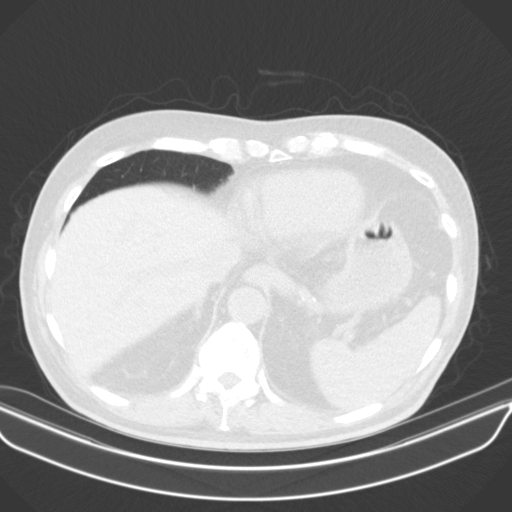
[im 17/63  lung]
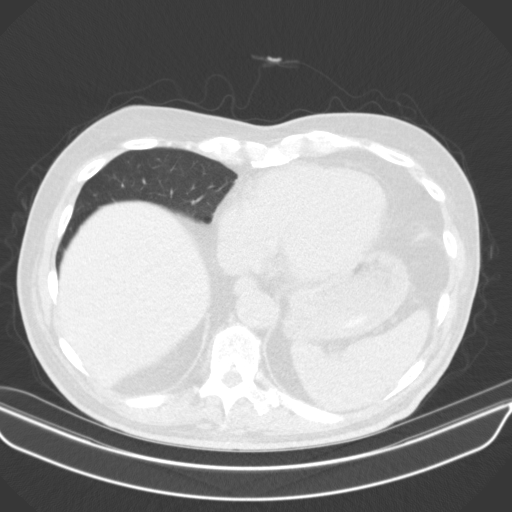
[im 21/63  mediastinal]
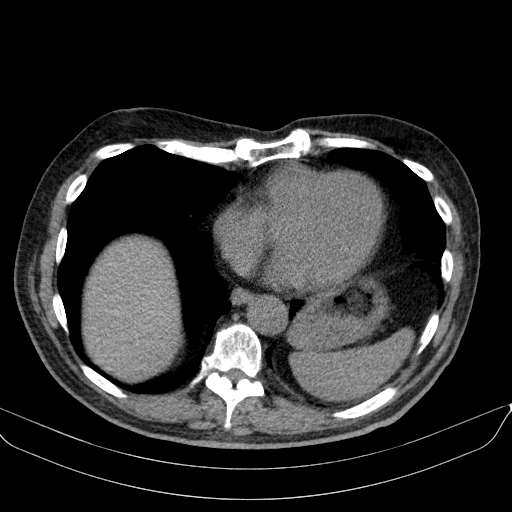
[im 21/63  lung]
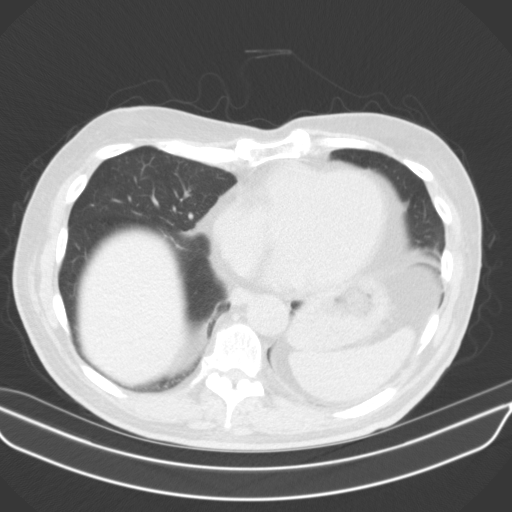
[im 25/63  lung]
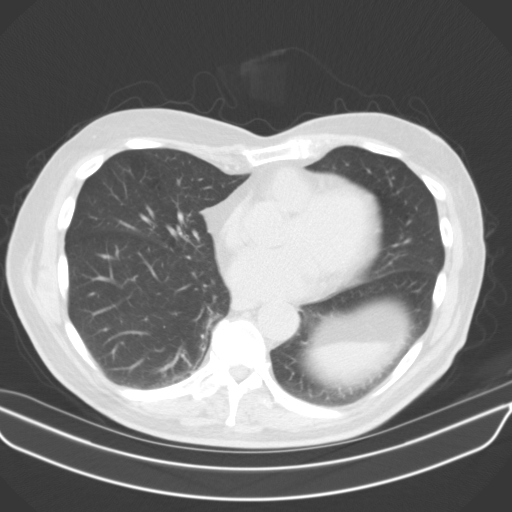
[im 28/63  lung]
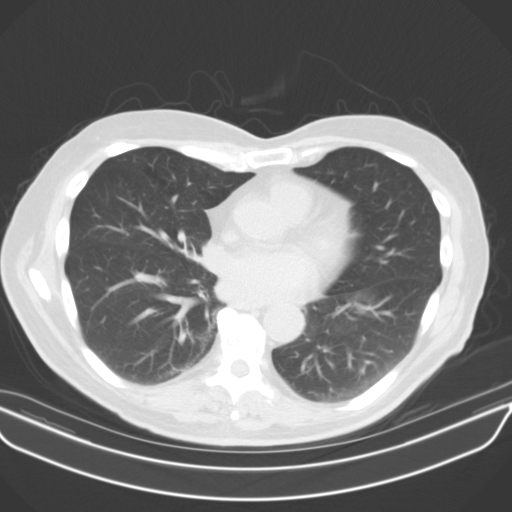
[im 33/63  lung]
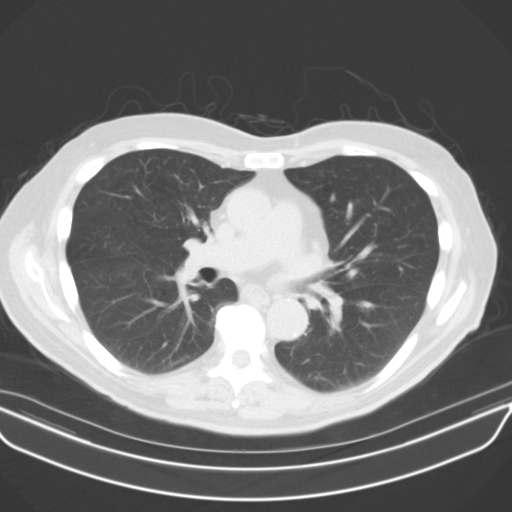
[im 35/63  mediastinal]
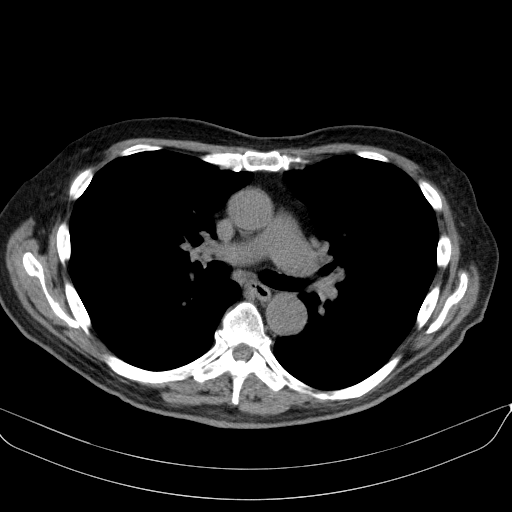
[im 35/63  lung]
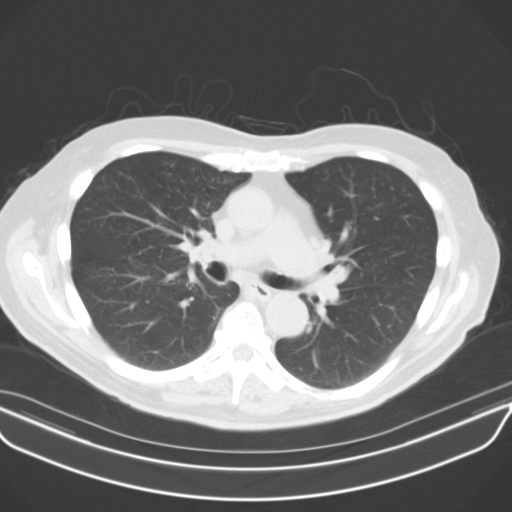
[im 38/63  lung]
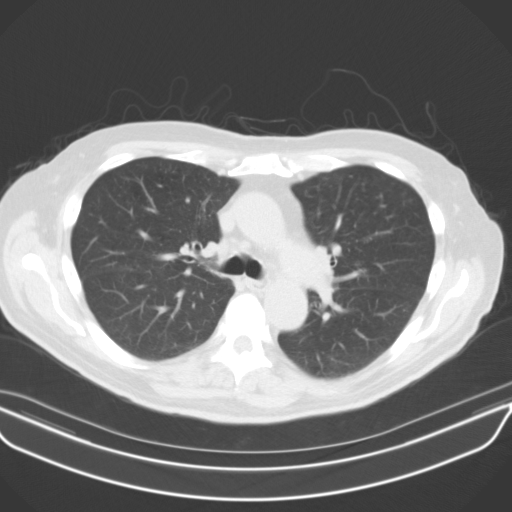
[im 42/63  lung]
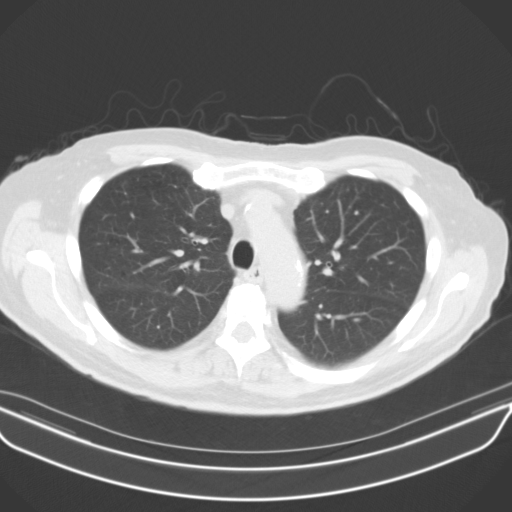
[im 46/63  lung]
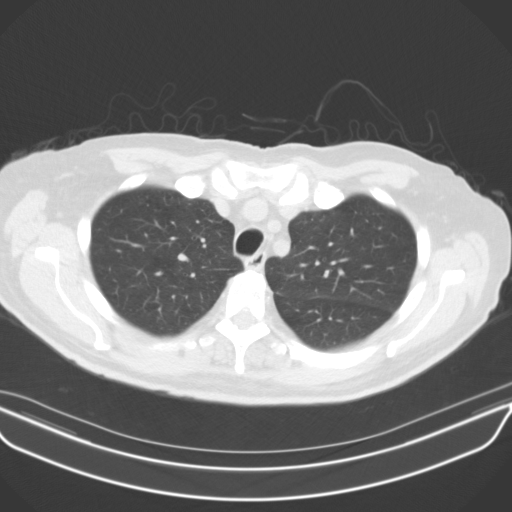
[im 50/63  mediastinal]
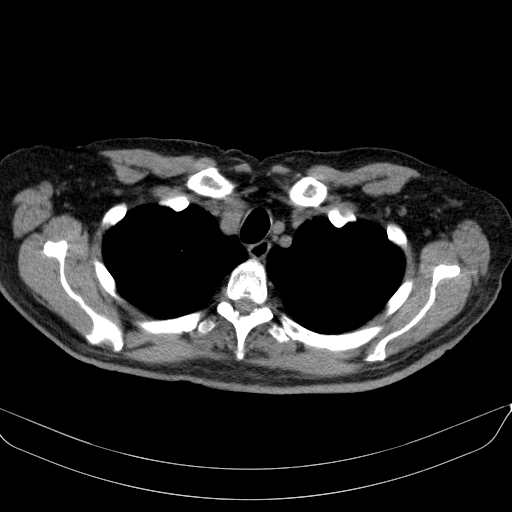
[im 50/63  lung]
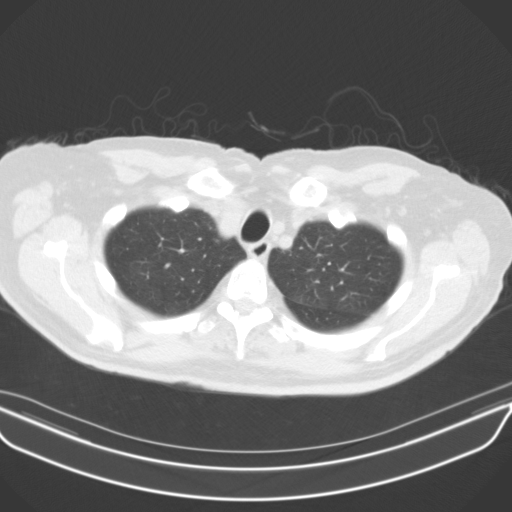
[im 53/63  lung]
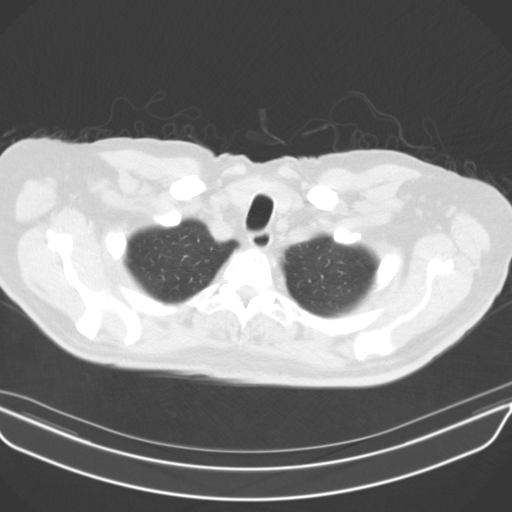
[im 58/63  lung]
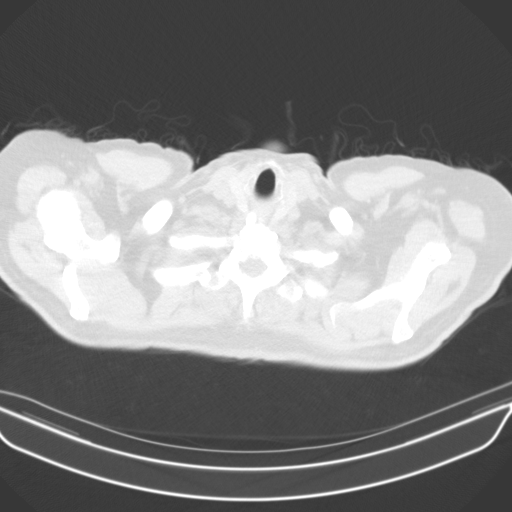

[15 of 33 positions shown; findings below may reference images not displayed]

FINDINGS: Cardiovascular: Aortic and branch vessel atherosclerosis. Normal
heart size, without pericardial effusion.

Mediastinum/Nodes: No mediastinal or definite hilar adenopathy,
given limitations of unenhanced CT.

Lungs/Pleura: No pleural fluid. Mild centrilobular emphysema. No
suspicious pulmonary nodule or mass.

Upper Abdomen: Normal imaged portions of the liver, spleen, stomach,
pancreas, gallbladder, adrenal glands, right kidney. 1.9 cm anterior
upper pole left renal low-density lesion is likely a cyst.

Musculoskeletal: Mild left hemidiaphragm elevation. Pectus excavatum
deformity. Lower cervical advanced spondylosis.
IMPRESSION: 1. Lung-RADS 1, negative. Continue annual screening with low-dose
chest CT without contrast in 12 months.
2. Aortic atherosclerosis (JLPNO-SVI.I) and emphysema (JLPNO-4RV.S).

## 2020-07-24 ENCOUNTER — Ambulatory Visit: Payer: Medicare Other | Admitting: Diagnostic Neuroimaging

## 2020-07-24 ENCOUNTER — Other Ambulatory Visit: Payer: Self-pay

## 2020-07-24 ENCOUNTER — Encounter: Payer: Self-pay | Admitting: Diagnostic Neuroimaging

## 2020-07-24 VITALS — BP 136/78 | HR 80 | Ht 72.0 in | Wt 179.0 lb

## 2020-07-24 DIAGNOSIS — G20 Parkinson's disease: Secondary | ICD-10-CM

## 2020-07-24 MED ORDER — CARBIDOPA-LEVODOPA 25-100 MG PO TABS: 2.0000 | ORAL_TABLET | Freq: Three times a day (TID) | ORAL | 4 refills | Status: AC

## 2020-07-24 NOTE — Progress Notes (Signed)
[facility]  PATIENT: Mitchell Hunter DOB: [birth year]  REFERRING CLINICIAN: Christain Sacramento, MD HISTORY FROM: patient and wife  REASON FOR VISIT: follow up    HISTORICAL  CHIEF COMPLAINT:  Chief Complaint  Patient presents with  . Parkinson's disease    Rm 6, 9 month FU  wife- Apolonio Schneiders  "still concerned about balance, has had a few falls"    HISTORY OF PRESENT ILLNESS:   UPDATE (07/24/20, VRP): Since last visit, doing about the same, except more balance issues.  No alleviating or aggravating factors. Tolerating carb/levo. Not using walker / cane.   UPDATE (10/18/19, VRP): Since last visit, doing about the same. Symptoms are stable. Severity is moderate. No alleviating or aggravating factors. Tolerating carb/levo 2 tabs three times a day.    PRIOR HPI (06/20/19): 75 year old male here for evaluation of tremor and gait difficulty.  For past 6 months patient has had gradual onset progressive intermittent tremor in his arms, at rest, with action and holding things.  Symptoms worse over time.  Symptoms are bilateral and symmetric.  Also has noted gait and balance difficulty, short steps, shuffling gait, soft voice.  Has had some decline in taste ability over the years.  Patient was tried on primidone for 4 to 6 weeks without benefit.  Patient's brother also was recently diagnosed with Parkinson's disease.   REVIEW OF SYSTEMS: Full 14 system review of systems performed and negative with exception of: As per HPI.  ALLERGIES: Allergies  Allergen Reactions  . Adhesive [Tape] Other (See Comments)    SKIN IS VERY THIN AND WILL TEAR EASILY- WILL NEED EITHER A TAPE ALTERNATIVE OR PAPER TAPE    HOME MEDICATIONS: Outpatient Medications Prior to Visit  Medication Sig Dispense Refill  . acetaminophen (TYLENOL) 650 MG CR tablet Take 650 mg by mouth every 8 (eight) hours as needed for pain.    Marland Kitchen allopurinol (ZYLOPRIM) 100 MG tablet Take 100 mg by mouth 2 (two) times daily.    Marland Kitchen  ALPRAZolam (XANAX) 0.25 MG tablet Take 0.25-0.5 mg by mouth at bedtime as needed for sleep.     Marland Kitchen amLODipine (NORVASC) 10 MG tablet Take 1 tablet (10 mg total) by mouth daily. 30 tablet 0  . aspirin EC 81 MG tablet Take 81 mg by mouth daily.    . benazepril (LOTENSIN) 20 MG tablet Take 20 mg by mouth daily.    . carbidopa-levodopa (SINEMET IR) 25-100 MG tablet Take 2 tablets by mouth 3 (three) times daily before meals. 540 tablet 4  . cetirizine (ZYRTEC) 10 MG tablet Take 10 mg by mouth daily.    . Cholecalciferol 1.25 MG (50000 UT) capsule Take one pill every 7 days (weekly)    . escitalopram (LEXAPRO) 10 MG tablet Take 10 mg by mouth daily.    . folic acid (FOLVITE) 1 MG tablet Take 1 tablet (1 mg total) by mouth daily. 30 tablet 0  . gabapentin (NEURONTIN) 600 MG tablet Take 600 mg by mouth 2 (two) times daily.     Marland Kitchen HYDROcodone-acetaminophen (NORCO/VICODIN) 5-325 MG tablet Take 1 tablet by mouth 2 (two) times daily as needed (for back or neck pain).     . Multiple Vitamin (MULTIVITAMIN) capsule Take 1 capsule by mouth daily.    . Multiple Vitamins-Minerals (PRESERVISION AREDS 2 PO) Take 1 capsule by mouth 2 (two) times daily.    . Omega-3 Fatty Acids (FISH OIL) 1000 MG CAPS Take 1,000 mg by mouth 2 (two) times daily.     Marland Kitchen  omeprazole (PRILOSEC) 20 MG capsule Take 20 mg by mouth daily.    . promethazine (PHENERGAN) 12.5 MG tablet Take 12.5 mg by mouth every 8 (eight) hours as needed for nausea or vomiting.     . thiamine 100 MG tablet Take 1 tablet (100 mg total) by mouth daily. 30 tablet 0  . tiZANidine (ZANAFLEX) 4 MG capsule Take 4 mg by mouth 3 (three) times daily.    Marland Kitchen triamcinolone cream (KENALOG) 0.1 % Apply 1 application topically as needed (for rashes or irritation).     . dapagliflozin propanediol (FARXIGA) 10 MG TABS tablet Take 1 tablet by mouth daily. (Patient not taking: Reported on 07/24/2020)     No facility-administered medications prior to visit.    PAST MEDICAL  HISTORY: Past Medical History:  Diagnosis Date  . Anxiety   . Chronic renal insufficiency   . COPD (chronic obstructive pulmonary disease) ([place])   . Diabetes mellitus without complication ([place])   . Gout   . Hyperlipidemia   . Hypertension   . Parkinsonism ([place])   . Pneumonia    hx    PAST SURGICAL HISTORY: Past Surgical History:  Procedure Laterality Date  . APPENDECTOMY    . ELBOW ARTHROSCOPY Right    torn cartilage  . KNEE ARTHROSCOPY Left    cartilage  . KNEE ARTHROSCOPY Right 07/22/2013   Procedure: ARTHROSCOPY KNEE;  Surgeon: Augustin Schooling, MD;  Location: [facility];  Service: Orthopedics;  Laterality: Right;  . LUMBAR LAMINECTOMY/DECOMPRESSION MICRODISCECTOMY N/A 05/04/2013   Procedure: Right Lumbar Three-Four laminectomy and microdiskectomy ;  Surgeon: Hosie Spangle, MD;  Location: [facility];  Service: Neurosurgery;  Laterality: N/A;  Right Lumbar Three-Four laminectomy and microdiskectomy   . RENAL BIOPSY  01/2019    FAMILY HISTORY: Family History  Problem Relation Age of Onset  . Dementia Mother        Alzheimer's  . Glaucoma Mother   . Lung disease Father   . Alcohol abuse Father   . Parkinsonism Brother     SOCIAL HISTORY: Social History   Socioeconomic History  . Marital status: Married    Spouse name: Apolonio Schneiders  . Number of children: 3  . Years of education: Not on file  . Highest education level: High school graduate  Occupational History    Comment: retired  Tobacco Use  . Smoking status: Current Every Day Smoker    Packs/day: 1.50    Years: 50.00    Pack years: 75.00    Types: Cigarettes  . Smokeless tobacco: Former Systems developer    Quit date: 02/23/1988  Substance and Sexual Activity  . Alcohol use: Yes    Comment: 10/18/19 Daily; 2 drinks per day  . Drug use: No  . Sexual activity: Not on file  Other Topics Concern  . Not on file  Social History Narrative   Lives with wife   Caffeine- coffee 1 c daily   Social Determinants of Health    Financial Resource Strain: Not on file  Food Insecurity: Not on file  Transportation Needs: Not on file  Physical Activity: Not on file  Stress: Not on file  Social Connections: Not on file  Intimate Partner Violence: Not on file     PHYSICAL EXAM  GENERAL EXAM/CONSTITUTIONAL: Vitals:  Vitals:   07/24/20 1101  BP: 136/78  Pulse: 80  Weight: 179 lb (81.2 kg)  Height: 6' (1.829 m)   Body mass index is 24.28 kg/m. Wt Readings from Last 3 Encounters:  07/24/20 179 lb (81.2 kg)  10/18/19 170 lb 12.8 oz (77.5 kg)  06/20/19 171 lb (77.6 kg)    Patient is in no distress; well developed, nourished and groomed; neck is supple  CARDIOVASCULAR:  Examination of carotid arteries is normal; no carotid bruits  Regular rate and rhythm, no murmurs  Examination of peripheral vascular system by observation and palpation is normal  EYES:  Ophthalmoscopic exam of optic discs and posterior segments is normal; no papilledema or hemorrhages No exam data present  MUSCULOSKELETAL:  Gait, strength, tone, movements noted in Neurologic exam below  NEUROLOGIC: MENTAL STATUS:  No flowsheet data found.  awake, alert, oriented to person, place and time  recent and remote memory intact  normal attention and concentration  language fluent, comprehension intact, naming intact  fund of knowledge appropriate  CRANIAL NERVE:   2nd - no papilledema on fundoscopic exam  2nd, 3rd, 4th, 6th - pupils equal and reactive to light, visual fields full to confrontation, extraocular muscles intact, no nystagmus  5th - facial sensation symmetric  7th - facial strength symmetric  8th - hearing intact  9th - palate elevates symmetrically, uvula midline  11th - shoulder shrug symmetric  12th - tongue protrusion midline  HOARSE VOICE  MOTOR:   MILD POSTURAL, ACTION AND RESTING TREMOR IN BUE  BRADYKINESIA IN BUE AND BLE; NO COGWHEELING  normal bulk and tone, full strength in the  BUE, BLE  SENSORY:   normal and symmetric to light touch, temperature, vibration  COORDINATION:   finger-nose-finger, fine finger movements normal  REFLEXES:   deep tendon reflexes TRACE and symmetric  GAIT/STATION:   narrow based gait; UNSTEADY WITH TURNS; DECR ARM SWING     DIAGNOSTIC DATA (LABS, IMAGING, TESTING) - I reviewed patient records, labs, notes, testing and imaging myself where available.  Lab Results  Component Value Date   WBC 7.9 01/11/2019   HGB 10.8 (L) 01/12/2019   HCT 32.7 (L) 01/12/2019   MCV 93.6 01/11/2019   PLT 232 01/11/2019      Component Value Date/Time   NA 141 01/11/2019 1335   K 5.0 01/11/2019 1335   CL 109 01/11/2019 1335   CO2 23 01/11/2019 1335   GLUCOSE 168 (H) 01/11/2019 1335   BUN 25 (H) 01/11/2019 1335   CREATININE 1.83 (H) 01/11/2019 1335   CALCIUM 8.6 (L) 01/11/2019 1335   PROT 6.3 (L) 07/01/2018 1126   ALBUMIN 3.5 07/01/2018 1126   AST 25 07/01/2018 1126   ALT 18 07/01/2018 1126   ALKPHOS 53 07/01/2018 1126   BILITOT 1.1 07/01/2018 1126   GFRNONAA 36 (L) 01/11/2019 1335   GFRAA 41 (L) 01/11/2019 1335   No results found for: CHOL, HDL, LDLCALC, LDLDIRECT, TRIG, CHOLHDL No results found for: HGBA1C No results found for: VITAMINB12 No results found for: TSH   07/19/19 MRI brain -Moderate perisylvian atrophy and mild chronic small vessel ischemic disease. -No acute findings.    ASSESSMENT AND PLAN  75 y.o. year old male here with resting tremor, action and postural tremor, bradykinesia, postural instability, since 2020.  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   Dx:  1. Parkinson's disease [facility])     PLAN:  PARKINSON'S DISEASE - continue carb/levo 2 tabs three times a day - use cane / walker  Return in about 1 year (around 07/24/2021).    Penni Bombard, MD [id number], [id number] AM Certified in Neurology, Neurophysiology and Neuroimaging  [facility] [address], [place],  [place] [address] [phone]

## 2020-10-27 ENCOUNTER — Other Ambulatory Visit: Payer: Self-pay | Admitting: Diagnostic Neuroimaging

## 2020-12-29 ENCOUNTER — Other Ambulatory Visit: Payer: Self-pay | Admitting: Diagnostic Neuroimaging

## 2021-02-14 ENCOUNTER — Inpatient Hospital Stay (HOSPITAL_COMMUNITY)
Admission: EM | Admit: 2021-02-14 | Discharge: 2021-02-20 | DRG: 028 | Disposition: A | Discharge status: Hospice | Payer: Medicare Other | Attending: Neurological Surgery | Admitting: Neurological Surgery

## 2021-02-14 ENCOUNTER — Encounter (HOSPITAL_COMMUNITY): Payer: Self-pay

## 2021-02-14 ENCOUNTER — Emergency Department (HOSPITAL_COMMUNITY): Payer: Medicare Other | Admitting: Anesthesiology

## 2021-02-14 ENCOUNTER — Emergency Department (HOSPITAL_COMMUNITY): Payer: Medicare Other

## 2021-02-14 ENCOUNTER — Encounter (HOSPITAL_COMMUNITY)
Admission: EM | Disposition: A | Discharge status: Hospice | Payer: Self-pay | Source: Home / Self Care | Attending: Neurological Surgery

## 2021-02-14 DIAGNOSIS — F17210 Nicotine dependence, cigarettes, uncomplicated: Secondary | ICD-10-CM | POA: Diagnosis present

## 2021-02-14 DIAGNOSIS — G8929 Other chronic pain: Secondary | ICD-10-CM | POA: Diagnosis present

## 2021-02-14 DIAGNOSIS — M4802 Spinal stenosis, cervical region: Secondary | ICD-10-CM | POA: Diagnosis present

## 2021-02-14 DIAGNOSIS — E8721 Acute metabolic acidosis: Secondary | ICD-10-CM | POA: Diagnosis not present

## 2021-02-14 DIAGNOSIS — I9581 Postprocedural hypotension: Secondary | ICD-10-CM | POA: Diagnosis not present

## 2021-02-14 DIAGNOSIS — I129 Hypertensive chronic kidney disease with stage 1 through stage 4 chronic kidney disease, or unspecified chronic kidney disease: Secondary | ICD-10-CM | POA: Diagnosis present

## 2021-02-14 DIAGNOSIS — G8250 Quadriplegia, unspecified: Secondary | ICD-10-CM | POA: Diagnosis present

## 2021-02-14 DIAGNOSIS — Z20822 Contact with and (suspected) exposure to covid-19: Secondary | ICD-10-CM | POA: Diagnosis present

## 2021-02-14 DIAGNOSIS — G20 Parkinson's disease: Secondary | ICD-10-CM | POA: Diagnosis present

## 2021-02-14 DIAGNOSIS — W102XXA Fall (on)(from) incline, initial encounter: Secondary | ICD-10-CM

## 2021-02-14 DIAGNOSIS — N189 Chronic kidney disease, unspecified: Secondary | ICD-10-CM

## 2021-02-14 DIAGNOSIS — S12400A Unspecified displaced fracture of fifth cervical vertebra, initial encounter for closed fracture: Secondary | ICD-10-CM | POA: Diagnosis not present

## 2021-02-14 DIAGNOSIS — Z820 Family history of epilepsy and other diseases of the nervous system: Secondary | ICD-10-CM | POA: Diagnosis not present

## 2021-02-14 DIAGNOSIS — N1832 Chronic kidney disease, stage 3b: Secondary | ICD-10-CM | POA: Diagnosis present

## 2021-02-14 DIAGNOSIS — Z515 Encounter for palliative care: Secondary | ICD-10-CM | POA: Diagnosis not present

## 2021-02-14 DIAGNOSIS — N1831 Chronic kidney disease, stage 3a: Secondary | ICD-10-CM | POA: Diagnosis not present

## 2021-02-14 DIAGNOSIS — E875 Hyperkalemia: Secondary | ICD-10-CM | POA: Diagnosis not present

## 2021-02-14 DIAGNOSIS — S14105A Unspecified injury at C5 level of cervical spinal cord, initial encounter: Secondary | ICD-10-CM | POA: Diagnosis present

## 2021-02-14 DIAGNOSIS — Z7189 Other specified counseling: Secondary | ICD-10-CM | POA: Diagnosis not present

## 2021-02-14 DIAGNOSIS — Z419 Encounter for procedure for purposes other than remedying health state, unspecified: Secondary | ICD-10-CM

## 2021-02-14 DIAGNOSIS — Z9181 History of falling: Secondary | ICD-10-CM

## 2021-02-14 DIAGNOSIS — N179 Acute kidney failure, unspecified: Secondary | ICD-10-CM | POA: Diagnosis not present

## 2021-02-14 DIAGNOSIS — J9811 Atelectasis: Secondary | ICD-10-CM | POA: Diagnosis present

## 2021-02-14 DIAGNOSIS — E860 Dehydration: Secondary | ICD-10-CM | POA: Diagnosis present

## 2021-02-14 DIAGNOSIS — Z79899 Other long term (current) drug therapy: Secondary | ICD-10-CM

## 2021-02-14 DIAGNOSIS — N184 Chronic kidney disease, stage 4 (severe): Secondary | ICD-10-CM

## 2021-02-14 DIAGNOSIS — M4312 Spondylolisthesis, cervical region: Secondary | ICD-10-CM | POA: Diagnosis present

## 2021-02-14 DIAGNOSIS — E1122 Type 2 diabetes mellitus with diabetic chronic kidney disease: Secondary | ICD-10-CM | POA: Diagnosis present

## 2021-02-14 DIAGNOSIS — E878 Other disorders of electrolyte and fluid balance, not elsewhere classified: Secondary | ICD-10-CM | POA: Diagnosis not present

## 2021-02-14 DIAGNOSIS — Z811 Family history of alcohol abuse and dependence: Secondary | ICD-10-CM

## 2021-02-14 DIAGNOSIS — J449 Chronic obstructive pulmonary disease, unspecified: Secondary | ICD-10-CM | POA: Diagnosis present

## 2021-02-14 DIAGNOSIS — R579 Shock, unspecified: Secondary | ICD-10-CM | POA: Diagnosis not present

## 2021-02-14 DIAGNOSIS — W010XXA Fall on same level from slipping, tripping and stumbling without subsequent striking against object, initial encounter: Secondary | ICD-10-CM | POA: Diagnosis present

## 2021-02-14 DIAGNOSIS — J9601 Acute respiratory failure with hypoxia: Secondary | ICD-10-CM | POA: Diagnosis not present

## 2021-02-14 DIAGNOSIS — S12430A Unspecified traumatic displaced spondylolisthesis of fifth cervical vertebra, initial encounter for closed fracture: Secondary | ICD-10-CM | POA: Diagnosis present

## 2021-02-14 DIAGNOSIS — Z83511 Family history of glaucoma: Secondary | ICD-10-CM | POA: Diagnosis not present

## 2021-02-14 DIAGNOSIS — E785 Hyperlipidemia, unspecified: Secondary | ICD-10-CM | POA: Diagnosis present

## 2021-02-14 DIAGNOSIS — Z66 Do not resuscitate: Secondary | ICD-10-CM | POA: Diagnosis not present

## 2021-02-14 DIAGNOSIS — Z91048 Other nonmedicinal substance allergy status: Secondary | ICD-10-CM | POA: Diagnosis not present

## 2021-02-14 DIAGNOSIS — F419 Anxiety disorder, unspecified: Secondary | ICD-10-CM | POA: Diagnosis present

## 2021-02-14 DIAGNOSIS — M4712 Other spondylosis with myelopathy, cervical region: Secondary | ICD-10-CM | POA: Diagnosis present

## 2021-02-14 DIAGNOSIS — G9520 Unspecified cord compression: Secondary | ICD-10-CM

## 2021-02-14 DIAGNOSIS — N170 Acute kidney failure with tubular necrosis: Secondary | ICD-10-CM | POA: Diagnosis not present

## 2021-02-14 DIAGNOSIS — R471 Dysarthria and anarthria: Secondary | ICD-10-CM | POA: Diagnosis present

## 2021-02-14 DIAGNOSIS — M109 Gout, unspecified: Secondary | ICD-10-CM | POA: Diagnosis present

## 2021-02-14 DIAGNOSIS — E8770 Fluid overload, unspecified: Secondary | ICD-10-CM | POA: Diagnosis not present

## 2021-02-14 DIAGNOSIS — Z7982 Long term (current) use of aspirin: Secondary | ICD-10-CM

## 2021-02-14 DIAGNOSIS — Y92009 Unspecified place in unspecified non-institutional (private) residence as the place of occurrence of the external cause: Secondary | ICD-10-CM

## 2021-02-14 DIAGNOSIS — Z7401 Bed confinement status: Secondary | ICD-10-CM

## 2021-02-14 HISTORY — PX: POSTERIOR CERVICAL FUSION/FORAMINOTOMY: SHX5038

## 2021-02-14 LAB — APTT: aPTT: 32 seconds (ref 24–36)

## 2021-02-14 LAB — GLUCOSE, CAPILLARY
Glucose-Capillary: 120 mg/dL — ABNORMAL HIGH (ref 70–99)
Glucose-Capillary: 176 mg/dL — ABNORMAL HIGH (ref 70–99)

## 2021-02-14 LAB — I-STAT CHEM 8, ED
BUN: 36 mg/dL — ABNORMAL HIGH (ref 8–23)
Calcium, Ion: 1.1 mmol/L — ABNORMAL LOW (ref 1.15–1.40)
Chloride: 107 mmol/L (ref 98–111)
Creatinine, Ser: 3.7 mg/dL — ABNORMAL HIGH (ref 0.61–1.24)
Glucose, Bld: 107 mg/dL — ABNORMAL HIGH (ref 70–99)
HCT: 33 % — ABNORMAL LOW (ref 39.0–52.0)
Hemoglobin: 11.2 g/dL — ABNORMAL LOW (ref 13.0–17.0)
Potassium: 5.5 mmol/L — ABNORMAL HIGH (ref 3.5–5.1)
Sodium: 135 mmol/L (ref 135–145)
TCO2: 22 mmol/L (ref 22–32)

## 2021-02-14 LAB — CBC
HCT: 33 % — ABNORMAL LOW (ref 39.0–52.0)
Hemoglobin: 11.1 g/dL — ABNORMAL LOW (ref 13.0–17.0)
MCH: 33.2 pg (ref 26.0–34.0)
MCHC: 33.6 g/dL (ref 30.0–36.0)
MCV: 98.8 fL (ref 80.0–100.0)
Platelets: 201 10*3/uL (ref 150–400)
RBC: 3.34 MIL/uL — ABNORMAL LOW (ref 4.22–5.81)
RDW: 14.8 % (ref 11.5–15.5)
WBC: 6.9 10*3/uL (ref 4.0–10.5)
nRBC: 0 % (ref 0.0–0.2)

## 2021-02-14 LAB — COMPREHENSIVE METABOLIC PANEL
ALT: <5 U/L (ref 0–44)
AST: 15 U/L (ref 15–41)
Albumin: 2.4 g/dL — ABNORMAL LOW (ref 3.5–5.0)
Alkaline Phosphatase: 79 U/L (ref 38–126)
Anion gap: 6 (ref 5–15)
BUN: 36 mg/dL — ABNORMAL HIGH (ref 8–23)
CO2: 22 mmol/L (ref 22–32)
Calcium: 8.5 mg/dL — ABNORMAL LOW (ref 8.9–10.3)
Chloride: 107 mmol/L (ref 98–111)
Creatinine, Ser: 3.21 mg/dL — ABNORMAL HIGH (ref 0.61–1.24)
GFR, Estimated: 19 mL/min — ABNORMAL LOW (ref >60)
Glucose, Bld: 114 mg/dL — ABNORMAL HIGH (ref 70–99)
Potassium: 5.7 mmol/L — ABNORMAL HIGH (ref 3.5–5.1)
Sodium: 135 mmol/L (ref 135–145)
Total Bilirubin: 0.7 mg/dL (ref 0.3–1.2)
Total Protein: 5.3 g/dL — ABNORMAL LOW (ref 6.5–8.1)

## 2021-02-14 LAB — TYPE AND SCREEN
ABO/RH(D): O POS
Antibody Screen: NEGATIVE

## 2021-02-14 LAB — DIFFERENTIAL
Abs Immature Granulocytes: 0.05 10*3/uL (ref 0.00–0.07)
Basophils Absolute: 0.1 10*3/uL (ref 0.0–0.1)
Basophils Relative: 1 %
Eosinophils Absolute: 0.3 10*3/uL (ref 0.0–0.5)
Eosinophils Relative: 4 %
Immature Granulocytes: 1 %
Lymphocytes Relative: 20 %
Lymphs Abs: 1.4 10*3/uL (ref 0.7–4.0)
Monocytes Absolute: 0.6 10*3/uL (ref 0.1–1.0)
Monocytes Relative: 8 %
Neutro Abs: 4.6 10*3/uL (ref 1.7–7.7)
Neutrophils Relative %: 66 %

## 2021-02-14 LAB — MRSA NEXT GEN BY PCR, NASAL: MRSA by PCR Next Gen: NOT DETECTED

## 2021-02-14 LAB — PROTIME-INR
INR: 1 (ref 0.8–1.2)
Prothrombin Time: 12.9 seconds (ref 11.4–15.2)

## 2021-02-14 LAB — CBG MONITORING, ED: Glucose-Capillary: 108 mg/dL — ABNORMAL HIGH (ref 70–99)

## 2021-02-14 LAB — SURGICAL PCR SCREEN
MRSA, PCR: NEGATIVE
Staphylococcus aureus: NEGATIVE

## 2021-02-14 LAB — ABO/RH: ABO/RH(D): O POS

## 2021-02-14 LAB — RESP PANEL BY RT-PCR (FLU A&B, COVID) ARPGX2
Influenza A by PCR: NEGATIVE
Influenza B by PCR: NEGATIVE
SARS Coronavirus 2 by RT PCR: NEGATIVE

## 2021-02-14 SURGERY — POSTERIOR CERVICAL FUSION/FORAMINOTOMY LEVEL 3
Anesthesia: General

## 2021-02-14 MED ORDER — EPHEDRINE 5 MG/ML INJ
INTRAVENOUS | Status: AC
  Filled 2021-02-14: qty 5

## 2021-02-14 MED ORDER — LIDOCAINE 2% (20 MG/ML) 5 ML SYRINGE
INTRAMUSCULAR | Status: AC
  Filled 2021-02-14: qty 5

## 2021-02-14 MED ORDER — ROCURONIUM BROMIDE 10 MG/ML (PF) SYRINGE
PREFILLED_SYRINGE | INTRAVENOUS | Status: AC
  Filled 2021-02-14: qty 10

## 2021-02-14 MED ORDER — MENTHOL 3 MG MT LOZG
1.0000 | LOZENGE | OROMUCOSAL | Status: DC | PRN
  Filled 2021-02-14: qty 9

## 2021-02-14 MED ORDER — CHLORHEXIDINE GLUCONATE 0.12 % MT SOLN
15.0000 mL | Freq: Once | OROMUCOSAL | Status: AC
  Administered 2021-02-14: 15 mL via OROMUCOSAL

## 2021-02-14 MED ORDER — METHOCARBAMOL 1000 MG/10ML IJ SOLN
500.0000 mg | Freq: Four times a day (QID) | INTRAVENOUS | Status: DC | PRN
  Filled 2021-02-14: qty 5

## 2021-02-14 MED ORDER — DOCUSATE SODIUM 100 MG PO CAPS
100.0000 mg | ORAL_CAPSULE | Freq: Two times a day (BID) | ORAL | Status: DC
  Administered 2021-02-15 – 2021-02-18 (×7): 100 mg via ORAL
  Filled 2021-02-14 (×7): qty 1

## 2021-02-14 MED ORDER — SODIUM CHLORIDE 0.9 % IV SOLN: INTRAVENOUS | Status: DC | PRN

## 2021-02-14 MED ORDER — SUCCINYLCHOLINE CHLORIDE 200 MG/10ML IV SOSY
PREFILLED_SYRINGE | INTRAVENOUS | Status: AC
  Filled 2021-02-14: qty 10

## 2021-02-14 MED ORDER — PROPOFOL 10 MG/ML IV BOLUS
INTRAVENOUS | Status: DC | PRN
  Administered 2021-02-14: 120 mg via INTRAVENOUS

## 2021-02-14 MED ORDER — ACETAMINOPHEN 325 MG PO TABS
650.0000 mg | ORAL_TABLET | ORAL | Status: DC | PRN
  Administered 2021-02-16 – 2021-02-18 (×4): 650 mg via ORAL
  Filled 2021-02-14 (×4): qty 2

## 2021-02-14 MED ORDER — SODIUM CHLORIDE 0.9% FLUSH: 3.0000 mL | INTRAVENOUS | Status: DC | PRN

## 2021-02-14 MED ORDER — BENAZEPRIL HCL 20 MG PO TABS
20.0000 mg | ORAL_TABLET | Freq: Every day | ORAL | Status: DC
  Filled 2021-02-14 (×2): qty 1

## 2021-02-14 MED ORDER — METHOCARBAMOL 500 MG PO TABS
500.0000 mg | ORAL_TABLET | Freq: Four times a day (QID) | ORAL | Status: DC | PRN
  Administered 2021-02-15 – 2021-02-18 (×4): 500 mg via ORAL
  Filled 2021-02-14 (×5): qty 1

## 2021-02-14 MED ORDER — PROPOFOL 10 MG/ML IV BOLUS
INTRAVENOUS | Status: AC
  Filled 2021-02-14: qty 20

## 2021-02-14 MED ORDER — CEFAZOLIN SODIUM-DEXTROSE 2-4 GM/100ML-% IV SOLN
INTRAVENOUS | Status: AC
  Administered 2021-02-14: 2 g via INTRAVENOUS
  Filled 2021-02-14: qty 100

## 2021-02-14 MED ORDER — DEXAMETHASONE SODIUM PHOSPHATE 10 MG/ML IJ SOLN
INTRAMUSCULAR | Status: DC | PRN
  Administered 2021-02-14: 10 mg via INTRAVENOUS

## 2021-02-14 MED ORDER — ACETAMINOPHEN 500 MG PO TABS
ORAL_TABLET | ORAL | Status: AC
  Filled 2021-02-14: qty 2

## 2021-02-14 MED ORDER — PHENYLEPHRINE HCL-NACL 20-0.9 MG/250ML-% IV SOLN
INTRAVENOUS | Status: DC | PRN
  Administered 2021-02-14: 50 ug/min via INTRAVENOUS

## 2021-02-14 MED ORDER — ORAL CARE MOUTH RINSE: 15.0000 mL | Freq: Once | OROMUCOSAL | Status: AC

## 2021-02-14 MED ORDER — DEXAMETHASONE SODIUM PHOSPHATE 10 MG/ML IJ SOLN
INTRAMUSCULAR | Status: AC
  Filled 2021-02-14: qty 1

## 2021-02-14 MED ORDER — LIDOCAINE-EPINEPHRINE 1 %-1:100000 IJ SOLN
INTRAMUSCULAR | Status: AC
  Filled 2021-02-14: qty 1

## 2021-02-14 MED ORDER — IOHEXOL 350 MG/ML SOLN
50.0000 mL | Freq: Once | INTRAVENOUS | Status: AC | PRN
  Administered 2021-02-14: 50 mL via INTRAVENOUS

## 2021-02-14 MED ORDER — ALPRAZOLAM 0.25 MG PO TABS: 0.2500 mg | ORAL_TABLET | Freq: Two times a day (BID) | ORAL | Status: DC | PRN

## 2021-02-14 MED ORDER — PROMETHAZINE HCL 25 MG/ML IJ SOLN
6.2500 mg | INTRAMUSCULAR | Status: DC | PRN
Start: 2021-02-14 — End: 2021-02-14

## 2021-02-14 MED ORDER — BUPIVACAINE-EPINEPHRINE (PF) 0.5% -1:200000 IJ SOLN
INTRAMUSCULAR | Status: DC | PRN
  Administered 2021-02-14: 8 mL

## 2021-02-14 MED ORDER — ONDANSETRON HCL 4 MG PO TABS: 4.0000 mg | ORAL_TABLET | Freq: Four times a day (QID) | ORAL | Status: DC | PRN

## 2021-02-14 MED ORDER — PHENOL 1.4 % MT LIQD: 1.0000 | OROMUCOSAL | Status: DC | PRN

## 2021-02-14 MED ORDER — FENTANYL CITRATE (PF) 250 MCG/5ML IJ SOLN
INTRAMUSCULAR | Status: DC | PRN
  Administered 2021-02-14: 100 ug via INTRAVENOUS

## 2021-02-14 MED ORDER — KETOROLAC TROMETHAMINE 15 MG/ML IJ SOLN
7.5000 mg | Freq: Four times a day (QID) | INTRAMUSCULAR | Status: AC
  Administered 2021-02-14 – 2021-02-15 (×4): 7.5 mg via INTRAVENOUS
  Filled 2021-02-14 (×4): qty 1

## 2021-02-14 MED ORDER — SODIUM CHLORIDE 0.9% FLUSH: 3.0000 mL | Freq: Once | INTRAVENOUS | Status: DC

## 2021-02-14 MED ORDER — SODIUM CHLORIDE 0.9 % IV SOLN: INTRAVENOUS | Status: DC

## 2021-02-14 MED ORDER — LIDOCAINE-EPINEPHRINE 1 %-1:100000 IJ SOLN
INTRAMUSCULAR | Status: DC | PRN
  Administered 2021-02-14: 8 mL

## 2021-02-14 MED ORDER — MORPHINE SULFATE (PF) 4 MG/ML IV SOLN
4.0000 mg | Freq: Once | INTRAVENOUS | Status: AC
  Administered 2021-02-14: 4 mg via INTRAVENOUS
  Filled 2021-02-14: qty 1

## 2021-02-14 MED ORDER — SODIUM CHLORIDE 0.9% IV SOLUTION: Freq: Once | INTRAVENOUS | Status: DC

## 2021-02-14 MED ORDER — ONDANSETRON HCL 4 MG/2ML IJ SOLN
INTRAMUSCULAR | Status: DC | PRN
  Administered 2021-02-14: 4 mg via INTRAVENOUS

## 2021-02-14 MED ORDER — FENTANYL CITRATE (PF) 100 MCG/2ML IJ SOLN: 25.0000 ug | INTRAMUSCULAR | Status: DC | PRN

## 2021-02-14 MED ORDER — VANCOMYCIN HCL 1000 MG IV SOLR
INTRAVENOUS | Status: AC
  Filled 2021-02-14: qty 20

## 2021-02-14 MED ORDER — THROMBIN 20000 UNITS EX SOLR
CUTANEOUS | Status: DC | PRN
  Administered 2021-02-14: 20 mL via TOPICAL

## 2021-02-14 MED ORDER — CARBIDOPA-LEVODOPA 25-100 MG PO TABS
3.0000 | ORAL_TABLET | Freq: Two times a day (BID) | ORAL | Status: DC
  Administered 2021-02-15 – 2021-02-18 (×7): 3 via ORAL
  Filled 2021-02-14 (×8): qty 3

## 2021-02-14 MED ORDER — OXYCODONE HCL 5 MG PO TABS
10.0000 mg | ORAL_TABLET | ORAL | Status: DC | PRN
  Administered 2021-02-15 – 2021-02-16 (×2): 10 mg via ORAL
  Filled 2021-02-14 (×2): qty 2

## 2021-02-14 MED ORDER — BACITRACIN ZINC 500 UNIT/GM EX OINT
TOPICAL_OINTMENT | CUTANEOUS | Status: DC | PRN
  Administered 2021-02-14: 1 via TOPICAL

## 2021-02-14 MED ORDER — FENTANYL CITRATE PF 50 MCG/ML IJ SOSY
50.0000 ug | PREFILLED_SYRINGE | Freq: Once | INTRAMUSCULAR | Status: AC
  Administered 2021-02-14: 50 ug via INTRAVENOUS
  Filled 2021-02-14: qty 1

## 2021-02-14 MED ORDER — SUCCINYLCHOLINE CHLORIDE 200 MG/10ML IV SOSY
PREFILLED_SYRINGE | INTRAVENOUS | Status: DC | PRN
Start: 2021-02-14 — End: 2021-02-14
  Administered 2021-02-14: 160 mg via INTRAVENOUS

## 2021-02-14 MED ORDER — ESCITALOPRAM OXALATE 10 MG PO TABS
10.0000 mg | ORAL_TABLET | Freq: Every day | ORAL | Status: DC
  Administered 2021-02-15 – 2021-02-16 (×2): 10 mg via ORAL
  Filled 2021-02-14 (×2): qty 1

## 2021-02-14 MED ORDER — THROMBIN 20000 UNITS EX SOLR
CUTANEOUS | Status: AC
  Filled 2021-02-14: qty 20000

## 2021-02-14 MED ORDER — ACETAMINOPHEN 650 MG RE SUPP: 650.0000 mg | RECTAL | Status: DC | PRN

## 2021-02-14 MED ORDER — GABAPENTIN 600 MG PO TABS
600.0000 mg | ORAL_TABLET | Freq: Two times a day (BID) | ORAL | Status: DC
  Administered 2021-02-15 – 2021-02-16 (×4): 600 mg via ORAL
  Filled 2021-02-14 (×6): qty 1

## 2021-02-14 MED ORDER — FENTANYL CITRATE (PF) 250 MCG/5ML IJ SOLN
INTRAMUSCULAR | Status: AC
  Filled 2021-02-14: qty 5

## 2021-02-14 MED ORDER — SODIUM CHLORIDE 0.9 % IV BOLUS
500.0000 mL | Freq: Once | INTRAVENOUS | Status: AC
  Administered 2021-02-14: 500 mL via INTRAVENOUS

## 2021-02-14 MED ORDER — THROMBIN 5000 UNITS EX SOLR
OROMUCOSAL | Status: DC | PRN
  Administered 2021-02-14: 5 mL via TOPICAL

## 2021-02-14 MED ORDER — BUPIVACAINE-EPINEPHRINE 0.5% -1:200000 IJ SOLN
INTRAMUSCULAR | Status: AC
  Filled 2021-02-14: qty 1

## 2021-02-14 MED ORDER — BACITRACIN ZINC 500 UNIT/GM EX OINT
TOPICAL_OINTMENT | CUTANEOUS | Status: AC
  Filled 2021-02-14: qty 28.35

## 2021-02-14 MED ORDER — CEFAZOLIN SODIUM-DEXTROSE 2-4 GM/100ML-% IV SOLN
2.0000 g | INTRAVENOUS | Status: AC
  Administered 2021-02-14: 2 g via INTRAVENOUS

## 2021-02-14 MED ORDER — CHLORHEXIDINE GLUCONATE CLOTH 2 % EX PADS
6.0000 | MEDICATED_PAD | Freq: Every day | CUTANEOUS | Status: DC
  Administered 2021-02-14 – 2021-02-19 (×5): 6 via TOPICAL

## 2021-02-14 MED ORDER — THROMBIN 5000 UNITS EX SOLR
CUTANEOUS | Status: AC
  Filled 2021-02-14: qty 5000

## 2021-02-14 MED ORDER — ONDANSETRON HCL 4 MG/2ML IJ SOLN
INTRAMUSCULAR | Status: AC
  Filled 2021-02-14: qty 2

## 2021-02-14 MED ORDER — 0.9 % SODIUM CHLORIDE (POUR BTL) OPTIME
TOPICAL | Status: DC | PRN
  Administered 2021-02-14: 1000 mL

## 2021-02-14 MED ORDER — CEFAZOLIN SODIUM-DEXTROSE 2-4 GM/100ML-% IV SOLN
2.0000 g | Freq: Three times a day (TID) | INTRAVENOUS | Status: AC
  Administered 2021-02-15: 2 g via INTRAVENOUS
  Filled 2021-02-14 (×2): qty 100

## 2021-02-14 MED ORDER — AMISULPRIDE (ANTIEMETIC) 5 MG/2ML IV SOLN: 10.0000 mg | Freq: Once | INTRAVENOUS | Status: DC | PRN

## 2021-02-14 MED ORDER — ROCURONIUM BROMIDE 10 MG/ML (PF) SYRINGE
PREFILLED_SYRINGE | INTRAVENOUS | Status: DC | PRN
  Administered 2021-02-14: 100 mg via INTRAVENOUS

## 2021-02-14 MED ORDER — LIDOCAINE 2% (20 MG/ML) 5 ML SYRINGE
INTRAMUSCULAR | Status: DC | PRN
  Administered 2021-02-14: 100 mg via INTRAVENOUS

## 2021-02-14 MED ORDER — SUGAMMADEX SODIUM 200 MG/2ML IV SOLN
INTRAVENOUS | Status: DC | PRN
  Administered 2021-02-14: 200 mg via INTRAVENOUS

## 2021-02-14 MED ORDER — HYDROMORPHONE HCL 1 MG/ML IJ SOLN
0.5000 mg | INTRAMUSCULAR | Status: DC | PRN
  Administered 2021-02-15 – 2021-02-17 (×2): 0.5 mg via INTRAVENOUS
  Filled 2021-02-14 (×2): qty 1

## 2021-02-14 MED ORDER — AMLODIPINE BESYLATE 10 MG PO TABS: 10.0000 mg | ORAL_TABLET | Freq: Every day | ORAL | Status: DC

## 2021-02-14 MED ORDER — HEPARIN SODIUM (PORCINE) 5000 UNIT/ML IJ SOLN
5000.0000 [IU] | Freq: Two times a day (BID) | INTRAMUSCULAR | Status: DC
  Administered 2021-02-15: 5000 [IU] via SUBCUTANEOUS
  Filled 2021-02-14: qty 1

## 2021-02-14 MED ORDER — HYDROCODONE-ACETAMINOPHEN 10-325 MG PO TABS
1.0000 | ORAL_TABLET | ORAL | Status: DC | PRN
  Administered 2021-02-16 (×2): 1 via ORAL
  Filled 2021-02-14 (×3): qty 1

## 2021-02-14 MED ORDER — SODIUM CHLORIDE 0.9 % IV SOLN: 250.0000 mL | INTRAVENOUS | Status: DC

## 2021-02-14 MED ORDER — ACETAMINOPHEN 500 MG PO TABS: 1000.0000 mg | ORAL_TABLET | Freq: Once | ORAL | Status: DC

## 2021-02-14 MED ORDER — ONDANSETRON HCL 4 MG/2ML IJ SOLN: 4.0000 mg | Freq: Four times a day (QID) | INTRAMUSCULAR | Status: DC | PRN

## 2021-02-14 MED ORDER — PANTOPRAZOLE SODIUM 40 MG PO TBEC
40.0000 mg | DELAYED_RELEASE_TABLET | Freq: Every day | ORAL | Status: DC
  Administered 2021-02-15 – 2021-02-18 (×3): 40 mg via ORAL
  Filled 2021-02-14 (×4): qty 1

## 2021-02-14 MED ORDER — SODIUM CHLORIDE 0.9% FLUSH
3.0000 mL | Freq: Two times a day (BID) | INTRAVENOUS | Status: DC
  Administered 2021-02-14: 3 mL via INTRAVENOUS

## 2021-02-14 MED ORDER — EPHEDRINE SULFATE-NACL 50-0.9 MG/10ML-% IV SOSY
PREFILLED_SYRINGE | INTRAVENOUS | Status: DC | PRN
  Administered 2021-02-14 (×2): 10 mg via INTRAVENOUS

## 2021-02-14 SURGICAL SUPPLY — 81 items
ADH SKN CLS APL DERMABOND .7 (GAUZE/BANDAGES/DRESSINGS) ×1
BAG COUNTER SPONGE SURGICOUNT (BAG) ×3 IMPLANT
BAG SPNG CNTER NS LX DISP (BAG) ×2
BAND INSRT 18 STRL LF DISP RB (MISCELLANEOUS) ×2
BAND RUBBER #18 3X1/16 STRL (MISCELLANEOUS) ×4 IMPLANT
BIT DRILL NEURO 2X3.1 SFT TUCH (MISCELLANEOUS) ×1 IMPLANT
BNDG GAUZE ELAST 4 BULKY (GAUZE/BANDAGES/DRESSINGS) ×1 IMPLANT
BUR MATCHSTICK NEURO 3.0 LAGG (BURR) ×1 IMPLANT
BUR SABER NEURO 2.5 (BURR) IMPLANT
CNTNR URN SCR LID CUP LEK RST (MISCELLANEOUS) ×1 IMPLANT
CONT SPEC 4OZ STRL OR WHT (MISCELLANEOUS) ×2
COVER BACK TABLE 60X90IN (DRAPES) ×2 IMPLANT
COVER MAYO STAND STRL (DRAPES) ×2 IMPLANT
DECANTER SPIKE VIAL GLASS SM (MISCELLANEOUS) ×1 IMPLANT
DERMABOND ADVANCED (GAUZE/BANDAGES/DRESSINGS) ×1
DERMABOND ADVANCED .7 DNX12 (GAUZE/BANDAGES/DRESSINGS) ×1 IMPLANT
DRAIN JACKSON RD 7FR 3/32 (WOUND CARE) IMPLANT
DRAPE C-ARM 42X72 X-RAY (DRAPES) ×2 IMPLANT
DRAPE LAPAROTOMY 100X72X124 (DRAPES) ×2 IMPLANT
DRAPE MICROSCOPE LEICA (MISCELLANEOUS) ×2 IMPLANT
DRAPE SURG 17X23 STRL (DRAPES) ×2 IMPLANT
DRILL NEURO 2X3.1 SOFT TOUCH (MISCELLANEOUS) ×2
DRSG OPSITE 4X5.5 SM (GAUZE/BANDAGES/DRESSINGS) ×1 IMPLANT
DRSG OPSITE POSTOP 4X6 (GAUZE/BANDAGES/DRESSINGS) ×2 IMPLANT
DURAPREP 26ML APPLICATOR (WOUND CARE) ×2 IMPLANT
ELECT BLADE INSULATED 4IN (ELECTROSURGICAL) ×2
ELECT BLADE INSULATED 6.5IN (ELECTROSURGICAL)
ELECT REM PT RETURN 9FT ADLT (ELECTROSURGICAL) ×2
ELECTRODE BLADE INSULATED 4IN (ELECTROSURGICAL) IMPLANT
ELECTRODE BLDE INSULATED 6.5IN (ELECTROSURGICAL) IMPLANT
ELECTRODE REM PT RTRN 9FT ADLT (ELECTROSURGICAL) ×1 IMPLANT
EVACUATOR 1/8 PVC DRAIN (DRAIN) ×1 IMPLANT
GAUZE 4X4 16PLY ~~LOC~~+RFID DBL (SPONGE) ×1 IMPLANT
GAUZE SPONGE 4X4 12PLY STRL (GAUZE/BANDAGES/DRESSINGS) ×1 IMPLANT
GAUZE SPONGE 4X4 12PLY STRL LF (GAUZE/BANDAGES/DRESSINGS) ×1 IMPLANT
GLOVE SRG 8 PF TXTR STRL LF DI (GLOVE) ×1 IMPLANT
GLOVE SURG ENC MOIS LTX SZ8 (GLOVE) ×2 IMPLANT
GLOVE SURG LTX SZ8 (GLOVE) ×2 IMPLANT
GLOVE SURG UNDER POLY LF SZ8 (GLOVE) ×2
GLOVE SURG UNDER POLY LF SZ8.5 (GLOVE) ×2 IMPLANT
GOWN STRL REUS W/ TWL LRG LVL3 (GOWN DISPOSABLE) IMPLANT
GOWN STRL REUS W/ TWL XL LVL3 (GOWN DISPOSABLE) ×1 IMPLANT
GOWN STRL REUS W/TWL 2XL LVL3 (GOWN DISPOSABLE) IMPLANT
GOWN STRL REUS W/TWL LRG LVL3 (GOWN DISPOSABLE)
GOWN STRL REUS W/TWL XL LVL3 (GOWN DISPOSABLE) ×2
HEMOSTAT POWDER KIT SURGIFOAM (HEMOSTASIS) ×2 IMPLANT
KIT BASIN OR (CUSTOM PROCEDURE TRAY) ×2 IMPLANT
KIT TURNOVER KIT B (KITS) ×2 IMPLANT
MARKER SKIN DUAL TIP RULER LAB (MISCELLANEOUS) ×2 IMPLANT
MILL MEDIUM DISP (BLADE) ×1 IMPLANT
NDL BLUNT 18X1 FOR OR ONLY (NEEDLE) ×1 IMPLANT
NDL HYPO 25X1 1.5 SAFETY (NEEDLE) ×1 IMPLANT
NDL SPNL 18GX3.5 QUINCKE PK (NEEDLE) ×2 IMPLANT
NEEDLE BLUNT 18X1 FOR OR ONLY (NEEDLE) IMPLANT
NEEDLE HYPO 25X1 1.5 SAFETY (NEEDLE) ×2 IMPLANT
NEEDLE SPNL 18GX3.5 QUINCKE PK (NEEDLE) IMPLANT
NS IRRIG 1000ML POUR BTL (IV SOLUTION) ×2 IMPLANT
PACK LAMINECTOMY NEURO (CUSTOM PROCEDURE TRAY) ×2 IMPLANT
PAD ARMBOARD 7.5X6 YLW CONV (MISCELLANEOUS) ×6 IMPLANT
PATTIES SURGICAL .5 X1 (DISPOSABLE) IMPLANT
PUTTY BONE 100 VESUVIUS 2.5CC (Putty) ×1 IMPLANT
ROD CONTOURED TI YUKON 3.5X75 (Rod) ×1 IMPLANT
ROD SPINAL STRT CASPIAN 3.5X80 (Rod) ×1 IMPLANT
ROD STRT CASP 3.5X80 NS (Rod) IMPLANT
SCREW PA YUKON 4.5X26 (Screw) ×2 IMPLANT
SCREW SET SPINAL YUKON (Set) ×10 IMPLANT
SCREW SPIN YUKON POLY 03.5X14 (Screw) ×8 IMPLANT
SPONGE SURGIFOAM ABS GEL 100 (HEMOSTASIS) ×2 IMPLANT
SPONGE T-LAP 4X18 ~~LOC~~+RFID (SPONGE) ×1 IMPLANT
STAPLER VISISTAT 35W (STAPLE) ×1 IMPLANT
SUT VIC AB 0 CT1 27 (SUTURE) ×2
SUT VIC AB 0 CT1 27XBRD ANBCTR (SUTURE) ×1 IMPLANT
SUT VIC AB 2-0 CP2 18 (SUTURE) ×2 IMPLANT
SUT VIC AB 3-0 SH 8-18 (SUTURE) ×3 IMPLANT
SYR 3ML LL SCALE MARK (SYRINGE) ×1 IMPLANT
TAP YUKON 3.0 DISP (TAP) ×1 IMPLANT
TAP YUKON 3.5 DISP (TAP) ×1 IMPLANT
TOWEL GREEN STERILE (TOWEL DISPOSABLE) ×2 IMPLANT
TOWEL GREEN STERILE FF (TOWEL DISPOSABLE) ×2 IMPLANT
TRAY FOLEY MTR SLVR 16FR STAT (SET/KITS/TRAYS/PACK) ×1 IMPLANT
WATER STERILE IRR 1000ML POUR (IV SOLUTION) ×2 IMPLANT

## 2021-02-14 NOTE — Anesthesia Preprocedure Evaluation (Addendum)
Anesthesia Evaluation  Patient identified by MRN, date of birth, ID band Patient awake    Reviewed: Allergy & Precautions, NPO status , Patient's Chart, lab work & pertinent test results  History of Anesthesia Complications Negative for: history of anesthetic complications  Airway Mallampati: II  TM Distance: >3 FB Neck ROM: Full    Dental  (+) Dental Advisory Given, Partial Upper,    Pulmonary COPD, Current Smoker,    Pulmonary exam normal        Cardiovascular hypertension, Normal cardiovascular exam  2019 Myoview ? The left ventricular ejection fraction is normal (55-65%). ? Nuclear stress EF: 57%. ? There was no ST segment deviation noted during stress. ? This is a low risk study.   1. EF 57%, normal wall motion.  2. Fixed small-medium-sized, mild basal to mid inferolateral perfusion defect.  No evidence for ischemia.  Cannot rule out prior MI, but with normal wall motion in the lateral wall, possible that this is attenuation artifact.   Low risk study.     Neuro/Psych Parkinson's Dz negative neurological ROS     GI/Hepatic negative GI ROS, Neg liver ROS,   Endo/Other  negative endocrine ROSdiabetes  Renal/GU negative Renal ROS     Musculoskeletal negative musculoskeletal ROS (+)   Abdominal   Peds  Hematology negative hematology ROS (+)   Anesthesia Other Findings   Reproductive/Obstetrics                           Anesthesia Physical Anesthesia Plan  ASA: 3 and emergent  Anesthesia Plan: General   Post-op Pain Management:    Induction: Intravenous  PONV Risk Score and Plan: 2 and Ondansetron, Dexamethasone and Diphenhydramine  Airway Management Planned: Oral ETT and Video Laryngoscope Planned  Additional Equipment: Arterial line  Intra-op Plan:   Post-operative Plan: Possible Post-op intubation/ventilation  Informed Consent: I have reviewed the patients History  and Physical, chart, labs and discussed the procedure including the risks, benefits and alternatives for the proposed anesthesia with the patient or authorized representative who has indicated his/her understanding and acceptance.     Dental advisory given and Consent reviewed with POA  Plan Discussed with: Anesthesiologist and CRNA  Anesthesia Plan Comments:        Anesthesia Quick Evaluation

## 2021-02-14 NOTE — ED Notes (Signed)
Dr. Dondra Spry activated level 2 trauma.

## 2021-02-14 NOTE — Anesthesia Procedure Notes (Signed)
Arterial Line Insertion Start/End11/01/2021 12:03 PM, 02/14/2021 1:03 PM Performed by: Imagene Riches, CRNA, CRNA  Patient location: Pre-op. Preanesthetic checklist: patient identified, IV checked, site marked, risks and benefits discussed, surgical consent, monitors and equipment checked, pre-op evaluation, timeout performed and anesthesia consent Left, radial was placed Catheter size: 20 G Hand hygiene performed  and maximum sterile barriers used  Allen's test indicative of satisfactory collateral circulation Attempts: 1 Procedure performed without using ultrasound guided technique. Following insertion, dressing applied and Biopatch. Post procedure assessment: normal  Patient tolerated the procedure well with no immediate complications.

## 2021-02-14 NOTE — ED Provider Notes (Signed)
[facility] EMERGENCY DEPARTMENT Provider Note   CSN: [id number] Arrival date & time: 02/14/21  4742  An emergency department physician performed an initial assessment on this suspected stroke patient at 29.  History Chief Complaint  Patient presents with   Mitchell Hunter is a 75 y.o. male.  Patient with history of Parkinson's disease not on blood thinner.  Arrives as a code stroke.  Had a fall this morning and now has weakness in his arms and his legs.  Neurology at the bedside upon arrival.  Complaining of neck pain.  The history is provided by the patient and the EMS personnel.  Fall This is a new problem. The current episode started 1 to 2 hours ago. The problem occurs constantly. The problem has not changed since onset.Pertinent negatives include no chest pain, no abdominal pain, no headaches and no shortness of breath. Associated symptoms comments: Neck pain . Nothing aggravates the symptoms. Nothing relieves the symptoms. He has tried nothing for the symptoms. The treatment provided no relief.      Past Medical History:  Diagnosis Date   Anxiety    Chronic renal insufficiency    COPD (chronic obstructive pulmonary disease) (HCC)    Diabetes mellitus without complication ([place])    Gout    Hyperlipidemia    Hypertension    Parkinsonism ([place])    Pneumonia    hx    Patient Active Problem List   Diagnosis Date Noted   Renal hemorrhage, left 01/11/2019   Pressure injury of skin 07/02/2018   AKI (acute kidney injury) ([place]) 07/01/2018   HNP (herniated nucleus pulposus), lumbar 05/04/2013    Past Surgical History:  Procedure Laterality Date   APPENDECTOMY     ELBOW ARTHROSCOPY Right    torn cartilage   KNEE ARTHROSCOPY Left    cartilage   KNEE ARTHROSCOPY Right 07/22/2013   Procedure: ARTHROSCOPY KNEE;  Surgeon: Augustin Schooling, MD;  Location: [facility];  Service: Orthopedics;  Laterality: Right;   LUMBAR LAMINECTOMY/DECOMPRESSION  MICRODISCECTOMY N/A 05/04/2013   Procedure: Right Lumbar Three-Four laminectomy and microdiskectomy ;  Surgeon: Hosie Spangle, MD;  Location: [facility];  Service: Neurosurgery;  Laterality: N/A;  Right Lumbar Three-Four laminectomy and microdiskectomy    RENAL BIOPSY  01/2019       Family History  Problem Relation Age of Onset   Dementia Mother        Alzheimer's   Glaucoma Mother    Lung disease Father    Alcohol abuse Father    Parkinsonism Brother     Social History   Tobacco Use   Smoking status: Every Day    Packs/day: 1.50    Years: 50.00    Pack years: 75.00    Types: Cigarettes   Smokeless tobacco: Former    Quit date: 02/23/1988  Substance Use Topics   Alcohol use: Yes    Comment: 10/18/19 Daily; 2 drinks per day   Drug use: No    Home Medications Prior to Admission medications   Medication Sig Start Date End Date Taking? Authorizing Provider  acetaminophen (TYLENOL) 650 MG CR tablet Take 650 mg by mouth every 8 (eight) hours as needed for pain.    [provider]  allopurinol (ZYLOPRIM) 100 MG tablet Take 100 mg by mouth 2 (two) times daily. 04/19/13   [provider]  ALPRAZolam Duanne Moron) 0.25 MG tablet Take 0.25-0.5 mg by mouth at bedtime as needed for sleep.  [provider]  amLODipine (NORVASC) 10 MG tablet Take 1 tablet (10 mg total) by mouth daily. 07/03/18   Shelly Coss, MD  aspirin EC 81 MG tablet Take 81 mg by mouth daily.    [provider]  benazepril (LOTENSIN) 20 MG tablet Take 20 mg by mouth daily.    [provider]  carbidopa-levodopa (SINEMET IR) 25-100 MG tablet Take 2 tablets by mouth 3 (three) times daily before meals. 07/24/20   Penumalli, Earlean Polka, MD  cetirizine (ZYRTEC) 10 MG tablet Take 10 mg by mouth daily.    [provider]  Cholecalciferol 1.25 MG (50000 UT) capsule Take one pill every 7 days (weekly) 06/10/19   [provider]  dapagliflozin propanediol (FARXIGA)  10 MG TABS tablet Take 1 tablet by mouth daily. Patient not taking: Reported on 07/24/2020 08/18/19   [provider]  escitalopram (LEXAPRO) 10 MG tablet Take 10 mg by mouth daily. 06/07/19   [provider]  folic acid (FOLVITE) 1 MG tablet Take 1 tablet (1 mg total) by mouth daily. 07/03/18   Shelly Coss, MD  gabapentin (NEURONTIN) 600 MG tablet Take 600 mg by mouth 2 (two) times daily.     [provider]  HYDROcodone-acetaminophen (NORCO/VICODIN) 5-325 MG tablet Take 1 tablet by mouth 2 (two) times daily as needed (for back or neck pain).     [provider]  Multiple Vitamin (MULTIVITAMIN) capsule Take 1 capsule by mouth daily.    [provider]  Multiple Vitamins-Minerals (PRESERVISION AREDS 2 PO) Take 1 capsule by mouth 2 (two) times daily.    [provider]  Omega-3 Fatty Acids (FISH OIL) 1000 MG CAPS Take 1,000 mg by mouth 2 (two) times daily.     [provider]  omeprazole (PRILOSEC) 20 MG capsule Take 20 mg by mouth daily.    [provider]  promethazine (PHENERGAN) 12.5 MG tablet Take 12.5 mg by mouth every 8 (eight) hours as needed for nausea or vomiting.  01/27/18   [provider]  thiamine 100 MG tablet Take 1 tablet (100 mg total) by mouth daily. 07/03/18   Shelly Coss, MD  tiZANidine (ZANAFLEX) 4 MG capsule Take 4 mg by mouth 3 (three) times daily. 01/20/19   [provider]  triamcinolone cream (KENALOG) 0.1 % Apply 1 application topically as needed (for rashes or irritation).  09/21/17   [provider]    Allergies    Adhesive [tape]  Review of Systems   Review of Systems  Constitutional:  Negative for chills and fever.  HENT:  Negative for ear pain and sore throat.   Eyes:  Negative for pain and visual disturbance.  Respiratory:  Negative for cough and shortness of breath.   Cardiovascular:  Negative for chest pain and palpitations.  Gastrointestinal:  Negative for  abdominal pain and vomiting.  Genitourinary:  Negative for dysuria and hematuria.  Musculoskeletal:  Positive for neck pain. Negative for arthralgias and back pain.  Skin:  Negative for color change and rash.  Neurological:  Positive for weakness and numbness. Negative for seizures, syncope, speech difficulty and headaches.  All other systems reviewed and are negative.  Physical Exam Updated Vital Signs  ED Triage Vitals  Enc Vitals Group     BP 02/14/21 0900 137/67     Pulse Rate 02/14/21 0935 61     Resp 02/14/21 0935 (!) 25     Temp 02/14/21 0935 (!) 97.5 F (36.4 C)  Temp Source 02/14/21 0935 Oral     SpO2 02/14/21 0935 98 %     Weight 02/14/21 0900 182 lb 15.7 oz (83 kg)     Height 02/14/21 0952 6' (1.829 m)     Head Circumference --      Peak Flow --      Pain Score --      Pain Loc --      Pain Edu? --      Excl. in [place]? --     Physical Exam Vitals and nursing note reviewed.  Constitutional:      General: He is not in acute distress.    Appearance: He is well-developed. He is not ill-appearing.  HENT:     Head: Normocephalic and atraumatic.     Right Ear: Tympanic membrane normal.     Nose: Nose normal.  Eyes:     Extraocular Movements: Extraocular movements intact.     Conjunctiva/sclera: Conjunctivae normal.     Pupils: Pupils are equal, round, and reactive to light.  Neck:     Comments: In cervical collar, midline spinal pain Cardiovascular:     Rate and Rhythm: Normal rate and regular rhythm.     Pulses: Normal pulses.     Heart sounds: Normal heart sounds. No murmur heard. Pulmonary:     Effort: Pulmonary effort is normal. No respiratory distress.     Breath sounds: Normal breath sounds.  Abdominal:     Palpations: Abdomen is soft.     Tenderness: There is no abdominal tenderness.  Musculoskeletal:     Cervical back: Tenderness present.  Skin:    General: Skin is warm and dry.  Neurological:     Mental Status: He is alert.     Sensory: Sensory  deficit present.     Motor: Weakness present.     Comments: Patient unable to move his lower extremities with no sensation, minimal movement in his upper extremities but unable to lift against gravity in both upper extremities but sensation intact in upper extremities, normal speech  Psychiatric:        Mood and Affect: Mood normal.    ED Results / Procedures / Treatments   Labs (all labs ordered are listed, but only abnormal results are displayed) Labs Reviewed  CBC - Abnormal; Notable for the following components:      Result Value   RBC 3.34 (*)    Hemoglobin 11.1 (*)    HCT 33.0 (*)    All other components within normal limits  I-STAT CHEM 8, ED - Abnormal; Notable for the following components:   Potassium 5.5 (*)    BUN 36 (*)    Creatinine, Ser 3.70 (*)    Glucose, Bld 107 (*)    Calcium, Ion 1.10 (*)    Hemoglobin 11.2 (*)    HCT 33.0 (*)    All other components within normal limits  CBG MONITORING, ED - Abnormal; Notable for the following components:   Glucose-Capillary 108 (*)    All other components within normal limits  RESP PANEL BY RT-PCR (FLU A&B, COVID) ARPGX2  PROTIME-INR  APTT  DIFFERENTIAL  COMPREHENSIVE METABOLIC PANEL  ABO/RH  TYPE AND SCREEN    EKG None  Radiology CT Angio Neck W and/or Wo Contrast  Result Date: 02/14/2021 CLINICAL DATA:  Spinal cord injury.  Fall with spinal fracture. EXAM: CT ANGIOGRAPHY NECK TECHNIQUE: Multidetector CT imaging of the neck was performed using the standard protocol during bolus administration of intravenous contrast.  Multiplanar CT image reconstructions and MIPs were obtained to evaluate the vascular anatomy. Carotid stenosis measurements (when applicable) are obtained utilizing NASCET criteria, using the distal internal carotid diameter as the denominator. CONTRAST:  60mL OMNIPAQUE IOHEXOL 350 MG/ML SOLN COMPARISON:  Cervical spine CT today FINDINGS: Aortic arch: Normal variant 4 vessel aortic arch with the left  vertebral artery arising directly from the arch. Mild atherosclerosis without arch vessel origin stenosis. Right carotid system: Patent with a small amount of calcified plaque at the carotid bifurcation. No evidence of stenosis or dissection. Left carotid system: Patent with mixed calcified and soft plaque in the mid common carotid artery resulting in 50% stenosis. Mixed calcified and soft plaque in the carotid bulb without significant ICA stenosis or dissection. Vertebral arteries: The vertebral arteries are patent and codominant without evidence of a significant stenosis on the left. There is approximately 50% narrowing of the right vertebral artery at C5-6 which appears at least partially due to extrinsic compression from traumatic retrolisthesis at this level although there is also some luminal irregularity raising concern for intimal injury as well. No discrete intimal flap, discrete intramural hematoma, or pseudoaneurysm is identified. Skeleton: C5 fractures involving the vertebral body and right transverse foramen with retrolisthesis as shown on earlier spine CT. Other neck: No evidence of cervical lymphadenopathy or mass. Mild soft tissue swelling/contusion in the right neck. Upper chest: Reported separately. IMPRESSION: 1. 50% narrowing of the right vertebral artery at C5-6 which may be due to a combination of extrinsic compression from retrolisthesis at this level and mild intimal injury. 2. 50% mid left common carotid artery stenosis. 3. C5 fractures as shown on earlier spine CT. 4. Aortic Atherosclerosis (ICD10-I70.0). Electronically Signed   By: Logan Bores M.D.   On: 02/14/2021 10:01   CT Cervical Spine Wo Contrast  Result Date: 02/14/2021 CLINICAL DATA:  Trauma, fall EXAM: CT CERVICAL SPINE WITHOUT CONTRAST TECHNIQUE: Multidetector CT imaging of the cervical spine was performed without intravenous contrast. Multiplanar CT image reconstructions were also generated. COMPARISON:  07/01/2018  FINDINGS: Alignment: There is acute 4 mm retrolisthesis at C5-C6 level. There is marked narrowing of AP diameter of spinal canal measuring 9 mm. These findings were not evident in the previous examination. Skull base and vertebrae: There is new fracture in the anterior aspect of body C5 vertebra. Degenerative changes are noted with bony spurs at multiple levels with interval progression. There is encroachment of neural foramina by bony spurs from C3 to C7 levels. There is encroachment of neural foramina from C3-C7 levels. There is significant interval worsening of neural foraminal narrowing at C5-C6 level. Soft tissues and spinal canal: There is marked spinal stenosis at C5-C6 level which appears to be related to traumatic retrolisthesis. There is extrinsic pressure over the ventral margin of thecal sac at other levels due to cervical spondylosis, particularly prominent at C6-C7 level. Disc levels:  As described above Upper chest: Unremarkable Other: None IMPRESSION: There is traumatic recent fracture in the body of C5 vertebra. There is acute traumatic retrolisthesis at C5-C6 level causing marked spinal stenosis. Findings suggest possible extrinsic compression of cervical spinal cord at this level. Neurosurgical consultation and MRI should be considered. 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 There is encroachment of neural foramina from C3-C7 levels. There is interval progression of degenerative changes. Imaging findings were relayed to Dr. Ronnald Nian by telephone call. He was already aware of the findings and is contacting neurosurgeon. Electronically Signed   By: Royston Cowper  Rathinasamy M.D.   On: 02/14/2021 09:30   CT T-SPINE NO CHARGE  Result Date: 02/14/2021 CLINICAL DATA:  Fall EXAM: CT Thoracic and Lumbar spine without contrast TECHNIQUE: Multiplanar CT images of the thoracic and lumbar spine were reconstructed from contemporary CT of the Chest, Abdomen, and Pelvis  CONTRAST:  None COMPARISON:  None FINDINGS: CT THORACIC SPINE FINDINGS Alignment: Preserved. Vertebrae: Minor loss of height at the superior endplate of T2 is most likely chronic. No definite acute fracture identified. Paraspinal and other soft tissues: Extra-spinal findings are better evaluated on concurrent dedicated imaging. Disc levels: Mild degenerative changes are present. CT LUMBAR SPINE FINDINGS Segmentation: 5 lumbar type vertebrae. Alignment: Preserved. Vertebrae: Vertebral body heights are maintained. No acute fracture. Paraspinal and other soft tissues: Extra-spinal findings are better evaluated on concurrent dedicated imaging. Disc levels: Partially calcified disc bulges are present at L4-L5 and L5-S1 with endplate osteophytic ridging. Facet hypertrophy at lower lumbar levels. Prior right laminectomy at L3-L4 and left laminectomy at L4-L5. IMPRESSION: No acute fracture of the thoracolumbar spine. Electronically Signed   By: Macy Mis M.D.   On: 02/14/2021 09:51   CT L-SPINE NO CHARGE  Result Date: 02/14/2021 CLINICAL DATA:  Fall EXAM: CT Thoracic and Lumbar spine without contrast TECHNIQUE: Multiplanar CT images of the thoracic and lumbar spine were reconstructed from contemporary CT of the Chest, Abdomen, and Pelvis CONTRAST:  None COMPARISON:  None FINDINGS: CT THORACIC SPINE FINDINGS Alignment: Preserved. Vertebrae: Minor loss of height at the superior endplate of T2 is most likely chronic. No definite acute fracture identified. Paraspinal and other soft tissues: Extra-spinal findings are better evaluated on concurrent dedicated imaging. Disc levels: Mild degenerative changes are present. CT LUMBAR SPINE FINDINGS Segmentation: 5 lumbar type vertebrae. Alignment: Preserved. Vertebrae: Vertebral body heights are maintained. No acute fracture. Paraspinal and other soft tissues: Extra-spinal findings are better evaluated on concurrent dedicated imaging. Disc levels: Partially calcified disc  bulges are present at L4-L5 and L5-S1 with endplate osteophytic ridging. Facet hypertrophy at lower lumbar levels. Prior right laminectomy at L3-L4 and left laminectomy at L4-L5. IMPRESSION: No acute fracture of the thoracolumbar spine. Electronically Signed   By: Macy Mis M.D.   On: 02/14/2021 09:51   CT HEAD CODE STROKE WO CONTRAST  Result Date: 02/14/2021 CLINICAL DATA:  Code stroke. Neuro deficit, acute, stroke suspected. EXAM: CT HEAD WITHOUT CONTRAST TECHNIQUE: Contiguous axial images were obtained from the base of the skull through the vertex without intravenous contrast. COMPARISON:  07/19/2019 FINDINGS: Brain: Generalized age related atrophy. Chronic small-vessel ischemic changes of the deep white matter. Old lacunar infarction left basal ganglia. No sign of acute infarction, mass lesion, hemorrhage, hydrocephalus or extra-axial collection. Vascular: No abnormal vascular finding. Skull: Normal Sinuses/Orbits: Clear/normal Other: None ASPECTS ([place] Stroke Program Early CT Score) - Ganglionic level infarction (caudate, lentiform nuclei, internal capsule, insula, M1-M3 cortex): 7 - Supraganglionic infarction (M4-M6 cortex): 3 Total score (0-10 with 10 being normal): 10 IMPRESSION: 1. No acute finding. Age related atrophy. Chronic small-vessel ischemic change of the white matter. 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 2. ASPECTS is 10. 3. These results were communicated to Dr. Curly Shores at 9:13 am on 02/14/2021 by text page via the [facility] messaging system. Electronically Signed   By: Nelson Chimes M.D.   On: 02/14/2021 09:15   CT CHEST ABDOMEN PELVIS WO CONTRAST  Result Date: 02/14/2021 CLINICAL DATA:  Trauma, fall EXAM: CT CHEST, ABDOMEN AND PELVIS WITHOUT CONTRAST TECHNIQUE: Multidetector CT imaging of the chest, abdomen  and pelvis was performed following the standard protocol without IV contrast. COMPARISON:  CT abdomen done on 01/11/2019 FINDINGS: CT CHEST FINDINGS Cardiovascular:  Heart is enlarged in size. There is no demonstrable mediastinal hematoma. There are scattered calcifications in the thoracic aorta. Central pulmonary arteries are unremarkable. Mediastinum/Nodes: There is no mediastinal hematoma. Lungs/Pleura: There are small patchy infiltrates in lingula and both lower lobes. There is no pneumothorax. There is no significant pleural effusion. Musculoskeletal: No displaced fractures are seen in the chest, abdomen and pelvis. CT ABDOMEN PELVIS FINDINGS Hepatobiliary: No focal abnormality is seen in the liver. There is no dilation of bile ducts. Gallbladder is not distended. There is subtle increased density in the neck of the gallbladder. There is no fluid around the gallbladder. Pancreas: No focal abnormality is seen. Spleen: There is no demonstrable focal laceration. There is no perisplenic fluid collection. Adrenals/Urinary Tract: Adrenals are unremarkable. There is contrast in the collecting systems, possibly related to recent intravenous contrast administration. Evaluation for small stones is limited due to contrast in the collecting systems. There is no demonstrable cortical laceration. There are few small smooth marginated fluid density lesions in the both kidneys, possibly renal cysts. Urinary bladder is unremarkable. Stomach/Bowel: Stomach is unremarkable. Small bowel loops are not dilated. Appendix is not seen. There is no significant wall thickening in the colon. There is no pericolic stranding or fluid collection. Vascular/Lymphatic: There are scattered arterial calcifications. There is 3.1 cm infrarenal aortic aneurysm. No significant lymphadenopathy seen. Reproductive: Prostate is enlarged. Other: There is no ascites or pneumoperitoneum. Small bilateral inguinal hernias containing fat are seen. Musculoskeletal: No recent displaced fracture is seen in the chest, abdomen and pelvis. IMPRESSION: There is no evidence mediastinal hematoma. Small linear patchy infiltrates in  the lingula and both lower lobes may suggest subsegmental atelectasis. There is no significant pleural effusion or pneumothorax. There is no evidence of laceration in the solid organs. There is no abnormal bowel wall thickening. There is no ascites or pneumoperitoneum. There is a 3.1 cm infrarenal aortic aneurysm. There is subtle increased density in the lumen of gallbladder suggesting possible tiny gallbladder stones. Other findings as described in the body of the report. Electronically Signed   By: Elmer Picker M.D.   On: 02/14/2021 10:01    Procedures .Critical Care Performed by: Lennice Sites, DO Authorized by: Lennice Sites, DO   Critical care provider statement:    Critical care time (minutes):  35   Critical care was necessary to treat or prevent imminent or life-threatening deterioration of the following conditions:  CNS failure or compromise and trauma   Critical care was time spent personally by me on the following activities:  Blood draw for specimens, development of treatment plan with patient or surrogate, discussions with consultants, discussions with primary provider, evaluation of patient's response to treatment, examination of patient, obtaining history from patient or surrogate, ordering and performing treatments and interventions, ordering and review of laboratory studies, ordering and review of radiographic studies, pulse oximetry, re-evaluation of patient's condition and review of old charts   I assumed direction of critical care for this patient from another provider in my specialty: no     Care discussed with: admitting provider     Medications Ordered in ED Medications  sodium chloride flush (NS) 0.9 % injection 3 mL (has no administration in time range)  sodium chloride 0.9 % bolus 500 mL (has no administration in time range)  iohexol (OMNIPAQUE) 350 MG/ML injection 50 mL (50 mLs Intravenous Contrast  Given 02/14/21 0921)  fentaNYL (SUBLIMAZE) injection 50 mcg (50  mcg Intravenous Given 02/14/21 1006)    ED Course  I have reviewed the triage vital signs and the nursing notes.  Pertinent labs & imaging results that were available during my care of the patient were reviewed by me and considered in my medical decision making (see chart for details).    MDM Rules/Calculators/A&P                           Mitchell Hunter is here after a fall.  Patient arrives as a code stroke given weakness in his upper arms and legs.  Patient with history of Parkinson's.  Not on blood thinners.  Normal vitals.  He comes in with no movement or sensation in his lower extremities.  Cannot lift his arm up against gravity bilaterally.  Does have sensation in the upper extremities.  Complaining of neck pain.  Neurology at the bedside upon arrival.  Concern is for spinal cord injury in the cervical spine with suspected fracture.  CT scan of the head and neck confirmed C5 fracture or retropulsion into the spinal cord.  Code stroke was canceled.  Patient was made a level 2 trauma at that time.  CT scans of his chest, abdomen and pelvis were obtained that were unremarkable.  Dr. Reatha Armour with neurosurgery was consulted and will get emergent MRI and take the patient to the operating room.  Overall patient with quadriplegia in the setting of cervical spine fracture.  He is breathing well.  Vital signs are normal.  Lab work is unremarkable.  Patient to go emergently to the OR with neurosurgery.  This chart was dictated using voice recognition software.  Despite best efforts to proofread,  errors can occur which can change the documentation meaning.   Final Clinical Impression(s) / ED Diagnoses Final diagnoses:  Fall (on)(from) incline, initial encounter  Closed displaced fracture of fifth cervical vertebra, unspecified fracture morphology, initial encounter [facility])  Spinal cord compression ([place])  Quadriplegia ([place])    Rx / DC Orders ED Discharge Orders     None        Lennice Sites, DO 02/14/21 1045

## 2021-02-14 NOTE — Progress Notes (Signed)
MRI reviewed.  She has severe stenosis at C5-6 with cord compression and moderate to severe stenosis at C6-7 centrally.  There is ligamentous and disc disruption at C5-6 along the AL L.  There is cord signal change on T2 within the spinal cord noted at C5-6 and superiorly behind the C4 vertebral body.  This is concerning for acute spinal cord injury likely due to longstanding spondylosis and stenosis.  I discussed and showed these findings to the patient's wife and daughter.  We discussed surgical intervention in the form of a posterior cervical decompression, laminectomy C4-7 with posterior instrumentation C3-T1.  They agreed to proceed with surgical intervention.  We discussed all risks, benefits and expected outcomes of the surgery as well as spinal cord injury patient has an [place] class B.  We discussed risks including but not limited to heart attack, stroke, death, infection, pneumonia, blood clots, spinal cord swelling, hardware complications, the need for further surgery, spinal fluid leak.  They understand and agree to proceed with intervention.  I answered all of their questions.    Elwin Sleight, [facility] Cell: [phone]

## 2021-02-14 NOTE — Code Documentation (Addendum)
Stroke Response Nurse Documentation Code Documentation  Mitchell Hunter is a 75 y.o. male arriving to [facility] ED via [facility] on 02/14/21 with past medical hx of Parkinsons-well controlled, HTN, HLD, COPD, chronic renal insufficiency. On 81mg  ASA daily. Code stroke was activated by EMS.   Patient from home where he was LKW at 2100 02/13/21.pt reports he woke up around 0400 and then was walking from the couch at 0715 and lost his balance when he fell backwards on his back and hit his head, he did not lose consciousness. Patient did have a recent fall in October though did not seek treatment.    Patient arrived with c-collar in place, slurred speech, bilateral upper and lower extremity weakness, decreased  Stroke team at the bedside on patient arrival. Labs drawn and patient cleared for CT by Dr. Ronnald Nian. Patient to CT with team. NIHSS 17, see documentation for details and code stroke times. Patient with bilateral arm weakness, bilateral leg weakness, bilateral decreased sensation, and dysarthria  on exam.  The following imaging was completed:  CT, CTA head and neck.  Patient is not a candidate for IV Thrombolytic due to acute spinal cord injury. Patient is not a candidate for IR due to acute spinal cord injury.   Care/Plan: Cancel code stroke per Bhagat MD. Additional imaging obtained, Level 2 Trauma activated, and neurosurgery consulted by EDP [place]olo.  Bedside handoff with ED RN Kathlee Nations.     Candace Cruise K  Stroke Response RN

## 2021-02-14 NOTE — Consult Note (Signed)
Neurology Consultation Reason for Consult: Weakness after fall Requesting Physician: Lennice Sites  CC: Fall, neck pain, weakness  History is obtained from: Patient, EMS and chart review  HPI: Mitchell Hunter is a 75 y.o. male with past medical history significant for Parkinson's disease, hypertension, hyperlipidemia, diabetes, gout, anxiety, and lumbar degenerative disc disease.  He had a fall about a week ago with potentially some neck injury but was not evaluated by a doctor at the time.  He was last seen by family in his normal state of health at about 9 PM on 11/9.  He woke up normally at 4 AM today day of admission (11/10).  At around 7 AM he had a fall and was subsequently unable to get up.  On EMS arrival he was noted to be quadriplegic.  Glucose on arrival was 108, blood pressure was 140/50, heart rates in the 60s.  He reports he was in his normal state of health prior to his fall but had some dizziness just before.  He is not on any anticoagulation at home, but does take aspirin 81 mg every other day.  He additionally has multiple sedating medications on his home medication list (will need to be verified) including tizanidine, Xanax, gabapentin, and hydrocodone  Regarding his Parkinson's disease he is followed on an outpatient basis by Dr. Jeannetta Nap.  He was last seen on 07/24/2020, and has been noted to be symptomatic since 2020, tolerating carbidopa levodopa 2 tabs 3 times a day well   LKW: 7 AM tPA given?: No, acute spinal cord fracture Premorbid modified rankin scale:     1 - No significant disability. Able to carry out all usual activities, despite some symptoms.  ROS: All other review of systems was negative except as noted in the HPI.   Past Medical History:  Diagnosis Date   Anxiety    Chronic renal insufficiency    COPD (chronic obstructive pulmonary disease) (HCC)    Diabetes mellitus without complication (HCC)    Gout    Hyperlipidemia    Hypertension     Parkinsonism ([place])    Pneumonia    hx   Past Surgical History:  Procedure Laterality Date   APPENDECTOMY     ELBOW ARTHROSCOPY Right    torn cartilage   KNEE ARTHROSCOPY Left    cartilage   KNEE ARTHROSCOPY Right 07/22/2013   Procedure: ARTHROSCOPY KNEE;  Surgeon: Augustin Schooling, MD;  Location: [place];  Service: Orthopedics;  Laterality: Right;   LUMBAR LAMINECTOMY/DECOMPRESSION MICRODISCECTOMY N/A 05/04/2013   Procedure: Right Lumbar Three-Four laminectomy and microdiskectomy ;  Surgeon: Hosie Spangle, MD;  Location: [place] [facility];  Service: Neurosurgery;  Laterality: N/A;  Right Lumbar Three-Four laminectomy and microdiskectomy    RENAL BIOPSY  01/2019    Family History  Problem Relation Age of Onset   Dementia Mother        Alzheimer's   Glaucoma Mother    Lung disease Father    Alcohol abuse Father    Parkinsonism Brother    Social History:  reports that he has been smoking cigarettes. He has a 75.00 pack-year smoking history. He quit smokeless tobacco use about 33 years ago. He reports current alcohol use. He reports that he does not use drugs.  Exam: Current vital signs: BP (!) 140/50   Pulse 61   Temp (!) 97.5 F (36.4 C) (Oral)   Resp (!) 25   Ht 6' (1.829 m)   Wt 82.6 kg  SpO2 98%   BMI 24.68 kg/m  Vital signs in last 24 hours: Temp:  [97.5 F (36.4 C)] 97.5 F (36.4 C) (11/10 0935) Pulse Rate:  [61] 61 (11/10 0935) Resp:  [25] 25 (11/10 0935) BP: (140)/(50) 140/50 (11/10 0935) SpO2:  [98 %] 98 % (11/10 0935) Weight:  [82.6 kg-83 kg] 82.6 kg (11/10 0952)   Physical Exam  Constitutional: Appears well-developed and well-nourished.  Psych: Affect appropriate to situation, calm and cooperative Eyes: No scleral injection HENT: No oropharyngeal obstruction.  C-collar in place.  Very dry mucous membranes. MSK: no joint deformities.  Cardiovascular: Normal rate and regular rhythm.  Respiratory: Effort normal, non-labored breathing GI: Soft.  No  distension. There is no tenderness.  Skin: Warm dry and intact visible skin  Neuro: Mental Status: Patient is awake, alert, oriented to person, place, month, year, and situation. Patient is able to give a clear and coherent history. No signs of aphasia or neglect in casual speech, formal naming and repetition were not tested in the acute setting given high concern for spinal cord injury Voice is slightly hypophonic consistent with his Parkinson's disease and speech is somewhat slurred which appears secondary to his very dry mouth Cranial Nerves: II: Visual Fields are full.  III,IV, VI: EOMI without ptosis or diploplia.  V: Facial sensation is symmetric to temperature and light touch VII: Facial movement is symmetric, with masked facies secondary to his Parkinson's disease.  VIII: hearing is intact to voice XII: tongue is midline without atrophy or fasciculations.  Motor: Tone is low throughout. Bulk is normal.  1/5 in the bilateral upper extremities, 2-3 at the right lower extremity, 1/5 in the left lower extremity Sensory: Severe sensory loss in the bilateral legs.  Reports that light touch is preserved in the bilateral arms and similar to touch on his face Deep Tendon Reflexes: Not tested in the acute setting Cerebellar: Unable to perform secondary to weakness  NIHSS total 12 Score breakdown: 3 points for left upper extremity weakness, 3 points for right upper extremity weakness, 3 points for left lower extremity weakness, 2 points for right lower extremity weakness, 2 points for sensory loss in the bilateral legs, one-point for dysarthria    I have reviewed labs in epic and the results pertinent to this consultation are:  Basic Metabolic Panel: Recent Labs  Lab 02/14/21 0901  NA 135  K 5.5*  CL 107  GLUCOSE 107*  BUN 36*  CREATININE 3.70*    CBC: Recent Labs  Lab 02/14/21 0857 02/14/21 0901  WBC 6.9  --   NEUTROABS 4.6  --   HGB 11.1* 11.2*  HCT 33.0* 33.0*   MCV 98.8  --   PLT 201  --     Coagulation Studies: Recent Labs    02/14/21 0857  LABPROT 12.9  INR 1.0      I have personally reviewed the images obtained:  Head CT without acute intracranial process CT C-spine: There is traumatic recent fracture in the body of C5 vertebra. There is acute traumatic retrolisthesis at C5-C6 level causing marked spinal stenosis. Findings suggest possible extrinsic compression of cervical spinal cord at this level. Neurosurgical consultation and MRI should be considered. 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 There is encroachment of neural foramina from C3-C7 levels. There is interval progression of degenerative changes.    Impression: Traumatic cervical spine injury, possibly in the setting of orthostatic hypotension and gait instability secondary to his Parkinson's disease.  Recommendations:  #  Traumatic C-spine injury -Emergent neurosurgical consult, defer to their expertise for further work-up and management  #History of Parkinson's disease > Patient's with Parkinson's disease are at risk of developing neuroleptic malignant syndrome (hyperthermia, rigidity, mental status changes, and autonomic dysregulation) if carbidopa/levodopa is abruptly discontinued -Please prioritize continued administration of carbidopa/levodopa postoperatively, if patient has significant swallowing difficulty, NG tube for administration should be discussed with patient and family -Please avoid antidopaminergic agents including metocloprimide, haloperidol, and prochlorperazine; note that this list is not exhaustive.   Neurology will be available on an as-needed basis going forward, please do reach out if any questions or concerns arise  [place] [phone]

## 2021-02-14 NOTE — Op Note (Signed)
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 Pieter Partridge C. Laquentin Loudermilk, DO  ASSISTANT: Weston Brass, NP; Verdis Prime, RN  ANESTHESIA: General Endotracheal  EBL: 100 cc  SPECIMENS: None  DRAINS: Medium Hemovac  COMPLICATIONS: None  CONDITION: Hemodynamically stable  HISTORY: Mitchell Hunter is a 75 y.o. male with a history of Parkinson's, multiple falls recently, that had a ground-level fall today at home and was found to have significant weakness and was brought to the emergency department.  Upon evaluation he had significant weakness from his biceps inferiorly concerning for spinal cord injury.  CT showed C5 anterior vertebral body fracture with retrolisthesis causing severe stenosis as well as multilevel moderate degenerative stenosis.  MRI revealed severe stenosis at C5-6 and C6-7 from spondylosis as well as an anterior vertebral body fracture at C5, STIR signal change and traumatic changes in the C5-6 disc base.  His neurologic examination was quite poor, he was significantly weak below the C5 level, with his biceps and deltoids being 3/5 motor strength.  He had decreased sensation noted in his trunk bilaterally classifying him as an [place] B spinal cord injury.  I discussed with the family about  urgent surgical intervention in the form of a posterior cervical decompression and fusion.  I discussed all risks, benefits and expected outcomes as well as expected outcomes of spinal cord injury.  They agreed to proceed with surgical intervention.  Informed consent was obtained from the patient and his family.  PROCEDURE IN DETAIL: The patient was brought to the operating room. After induction of general anesthesia, the patient's head was fixed with the [place] head holder and the patient was positioned on the operative table in the prone position, secured to the table in a [place] head holder.  This was performed carefully with the cervical collar in place which was then removed once his head was fixed with the head holder.  All pressure points were meticulously padded. Skin incision was then marked out and prepped and draped in the usual sterile fashion.  Physician driven timeout was performed.  Local anesthetic was injected into the planned incision.  Using a 10 blade, the incision was made posteriorly along the C3-T1 spinous processes.  Using Bovie electrocautery, dissection was performed down to the dorsal fascia.  Bilateral subperiosteal dissection was performed bilaterally to expose the C3-T1 lamina and facet joints bilaterally.  Deep retractors placed in the wound.  Lateral fluoroscopy confirmed the appropriate level.  The microscope was then sterilely draped and brought into the field.  A inferior partial laminectomy was performed at C4 bilaterally to the level of the ligamentum flavum.  Bilateral troughs were created at C5, C6 and the superior half of the C7 lamina was drilled down to the epidural space.  The ligamentum flavum was carefully dissected with microcurette and disconnected with Kerrison rongeurs.  The C5 and C6 lamina and spinous processes were gently elevated and removed.  This autograft was saved for later use.  Using Kerrison rongeurs, the remainder of the ligamentum flavum under C4  and the superior portion of C7 was removed.  The bilateral lateral gutters at C4-5, C5-6 and C6-7 were decompressed with Kerrison rongeurs.  Epidural hemostasis was achieved with bipolar cautery and Surgifoam.  The thecal sac was noted to be pulsatile and decompressed.  Pilot holes were then created in the C3 lateral mass bilaterally, C4 bilaterally, C5 bilaterally, C6 bilaterally.  The C5-6 and C6-7 joints were decorticated with a high-speed drill, there was autofusion at C3-4 and C4-5 bilaterally.  The pilot holes were then followed with a tap at 3.0 mm to a depth of 12 mm.  Each were felt with a ball-tipped probe and noted to have all bony borders.  14 mm lateral mass screws were then selected and placed with appropriate bony purchase at C3 bilaterally, C4 bilaterally, C5 bilaterally and C6 bilaterally.  AP and lateral fluoroscopy confirmed appropriate placement.  Pilot holes were then created for the T1 pedicle screws with a high-speed drill.  Using a pedicle finder, the pedicle was accessed.  Using a ball-tipped probe, there were all bony borders.  Using a 3.5 mm tap to a depth of 20 mm, a tract was created bilaterally.  Ball-tipped probe confirmed appropriate bony borders in all directions and a bony floor.  The above pedicle screws were then placed with appropriate bony purchase.  AP fluoroscopy confirmed appropriate placement.  The C7-T1 joints were decorticated bilaterally as well as the C3, C4, C5, C6 and C7 lateral masses.  Appropriate sized rods were then measured, selected and contoured and placed bilaterally.  Setscrews were then placed and final tightened to the manufacturer's recommendation.  The allograft and autograft was then mixed and placed in the lateral gutters bilaterally.  Deep retractors were taken out of the wound.  Hemostasis achieved with bipolar cautery and Surgifoam.  The wound was monitored and noted be excellently hemostatic.  A medium Hemovac was tunneled inferiorly and placed in  the epidural space.  The wound was then closed in layers, 0 Vicryl sutures for muscle and fascia.  2-0 and 3-0 Vicryl sutures for dermis.  Dermabond was used for skin closure.  Sterile dressing was applied.  At the end of the case all sponge, needle, and instrument counts were correct. The patient was then transferred to the stretcher, extubated, and taken to the post-anesthesia care unit in stable hemodynamic condition.

## 2021-02-14 NOTE — H&P (Signed)
Providing Compassionate, Quality Care - Together  NEUROSURGERY HISTORY & PHYSICAL   Mitchell Hunter is an 75 y.o. male.   Chief Complaint: Ground-level fall HPI: This is a 75 year old male with a history of Parkinson's disease, hypertension, chronic low back and neck pain, that had a fall 3 weeks ago per his wife and over the past month has had significant difficulty with his balance and falls has been progressively worsening significantly.  Today while walking in his house he had a fall backwards followed by significant weakness in his arms and legs.  He was brought to the emergency department, code stroke was initiated.  CT cervical spine revealed a C5 anterior vertebral body fracture with retrolisthesis and severe stenosis concerning for spinal cord injury.  He has significant weakness from his deltoid/biceps down with minimal sensation.  Currently in a cervical collar.  He complains of neck pain.  Past Medical History:  Diagnosis Date   Anxiety    Chronic renal insufficiency    COPD (chronic obstructive pulmonary disease) (HCC)    Diabetes mellitus without complication (HCC)    Gout    Hyperlipidemia    Hypertension    Parkinsonism ([place])    Pneumonia    hx    Past Surgical History:  Procedure Laterality Date   APPENDECTOMY     ELBOW ARTHROSCOPY Right    torn cartilage   KNEE ARTHROSCOPY Left    cartilage   KNEE ARTHROSCOPY Right 07/22/2013   Procedure: ARTHROSCOPY KNEE;  Surgeon: Augustin Schooling, MD;  Location: [facility];  Service: Orthopedics;  Laterality: Right;   LUMBAR LAMINECTOMY/DECOMPRESSION MICRODISCECTOMY N/A 05/04/2013   Procedure: Right Lumbar Three-Four laminectomy and microdiskectomy ;  Surgeon: Hosie Spangle, MD;  Location: [facility];  Service: Neurosurgery;  Laterality: N/A;  Right Lumbar Three-Four laminectomy and microdiskectomy    RENAL BIOPSY  01/2019    Family History  Problem Relation Age of Onset   Dementia Mother        Alzheimer's   Glaucoma  Mother    Lung disease Father    Alcohol abuse Father    Parkinsonism Brother    Social History:  reports that he has been smoking cigarettes. He has a 75.00 pack-year smoking history. He quit smokeless tobacco use about 33 years ago. He reports current alcohol use. He reports that he does not use drugs.  Allergies:  Allergies  Allergen Reactions   Adhesive [Tape] Other (See Comments)    SKIN IS VERY THIN AND WILL TEAR EASILY- WILL NEED EITHER A TAPE ALTERNATIVE OR PAPER TAPE    (Not in a hospital admission)   Results for orders placed or performed during the hospital encounter of 02/14/21 (from the past 48 hour(s))  Protime-INR     Status: None   Collection Time: 02/14/21  8:57 AM  Result Value Ref Range   Prothrombin Time 12.9 11.4 - 15.2 seconds   INR 1.0 0.8 - 1.2    Comment: (NOTE) INR goal varies based on device and disease states. Performed at [facility], [place] [address] [place], [place] [address]   APTT     Status: None   Collection Time: 02/14/21  8:57 AM  Result Value Ref Range   aPTT 32 24 - 36 seconds    Comment: Performed at [facility] [address] [place],  [address]  CBC     Status: Abnormal   Collection Time: 02/14/21  8:57 AM  Result Value  Ref Range   WBC 6.9 4.0 - 10.5 K/uL   RBC 3.34 (L) 4.22 - 5.81 MIL/uL   Hemoglobin 11.1 (L) 13.0 - 17.0 g/dL   HCT 33.0 (L) 39.0 - 52.0 %   MCV 98.8 80.0 - 100.0 fL   MCH 33.2 26.0 - 34.0 pg   MCHC 33.6 30.0 - 36.0 g/dL   RDW 14.8 11.5 - 15.5 %   Platelets 201 150 - 400 K/uL   nRBC 0.0 0.0 - 0.2 %    Comment: Performed at [facility], [place] [address] [place], [place] [address]  Differential     Status: None   Collection Time: 02/14/21  8:57 AM  Result Value Ref Range   Neutrophils Relative % 66 %   Neutro Abs 4.6 1.7 - 7.7 K/uL   Lymphocytes Relative 20 %   Lymphs Abs 1.4 0.7 - 4.0 K/uL   Monocytes Relative 8 %   Monocytes Absolute 0.6 0.1 - 1.0 K/uL   Eosinophils Relative 4  %   Eosinophils Absolute 0.3 0.0 - 0.5 K/uL   Basophils Relative 1 %   Basophils Absolute 0.1 0.0 - 0.1 K/uL   Immature Granulocytes 1 %   Abs Immature Granulocytes 0.05 0.00 - 0.07 K/uL    Comment: Performed at [facility] [address] [place], [place] [address]  CBG monitoring, ED     Status: Abnormal   Collection Time: 02/14/21  8:57 AM  Result Value Ref Range   Glucose-Capillary 108 (H) 70 - 99 mg/dL    Comment: Glucose reference range applies only to samples taken after fasting for at least 8 hours.  ABO/Rh     Status: None (Preliminary result)   Collection Time: 02/14/21  8:57 AM  Result Value Ref Range   ABO/RH(D) PENDING   I-stat chem 8, ED     Status: Abnormal   Collection Time: 02/14/21  9:01 AM  Result Value Ref Range   Sodium 135 135 - 145 mmol/L   Potassium 5.5 (H) 3.5 - 5.1 mmol/L   Chloride 107 98 - 111 mmol/L   BUN 36 (H) 8 - 23 mg/dL   Creatinine, Ser 3.70 (H) 0.61 - 1.24 mg/dL   Glucose, Bld 107 (H) 70 - 99 mg/dL    Comment: Glucose reference range applies only to samples taken after fasting for at least 8 hours.   Calcium, Ion 1.10 (L) 1.15 - 1.40 mmol/L   TCO2 22 22 - 32 mmol/L   Hemoglobin 11.2 (L) 13.0 - 17.0 g/dL   HCT 33.0 (L) 39.0 - 52.0 %   CT Angio Neck W and/or Wo Contrast  Result Date: 02/14/2021 CLINICAL DATA:  Spinal cord injury.  Fall with spinal fracture. EXAM: CT ANGIOGRAPHY NECK TECHNIQUE: Multidetector CT imaging of the neck was performed using the standard protocol during bolus administration of intravenous contrast. Multiplanar CT image reconstructions and MIPs were obtained to evaluate the vascular anatomy. Carotid stenosis measurements (when applicable) are obtained utilizing NASCET criteria, using the distal internal carotid diameter as the denominator. CONTRAST:  45mL OMNIPAQUE IOHEXOL 350 MG/ML SOLN COMPARISON:  Cervical spine CT today FINDINGS: Aortic arch: Normal variant 4 vessel aortic arch with the left vertebral artery  arising directly from the arch. Mild atherosclerosis without arch vessel origin stenosis. Right carotid system: Patent with a small amount of calcified plaque at the carotid bifurcation. No evidence of stenosis or dissection. Left carotid system: Patent with mixed calcified and soft plaque in the mid common  carotid artery resulting in 50% stenosis. Mixed calcified and soft plaque in the carotid bulb without significant ICA stenosis or dissection. Vertebral arteries: The vertebral arteries are patent and codominant without evidence of a significant stenosis on the left. There is approximately 50% narrowing of the right vertebral artery at C5-6 which appears at least partially due to extrinsic compression from traumatic retrolisthesis at this level although there is also some luminal irregularity raising concern for intimal injury as well. No discrete intimal flap, discrete intramural hematoma, or pseudoaneurysm is identified. Skeleton: C5 fractures involving the vertebral body and right transverse foramen with retrolisthesis as shown on earlier spine CT. Other neck: No evidence of cervical lymphadenopathy or mass. Mild soft tissue swelling/contusion in the right neck. Upper chest: Reported separately. IMPRESSION: 1. 50% narrowing of the right vertebral artery at C5-6 which may be due to a combination of extrinsic compression from retrolisthesis at this level and mild intimal injury. 2. 50% mid left common carotid artery stenosis. 3. C5 fractures as shown on earlier spine CT. 4. Aortic Atherosclerosis (ICD10-I70.0). Electronically Signed   By: Logan Bores M.D.   On: 02/14/2021 10:01   CT Cervical Spine Wo Contrast  Result Date: 02/14/2021 CLINICAL DATA:  Trauma, fall EXAM: CT CERVICAL SPINE WITHOUT CONTRAST TECHNIQUE: Multidetector CT imaging of the cervical spine was performed without intravenous contrast. Multiplanar CT image reconstructions were also generated. COMPARISON:  07/01/2018 FINDINGS: Alignment:  There is acute 4 mm retrolisthesis at C5-C6 level. There is marked narrowing of AP diameter of spinal canal measuring 9 mm. These findings were not evident in the previous examination. Skull base and vertebrae: There is new fracture in the anterior aspect of body C5 vertebra. Degenerative changes are noted with bony spurs at multiple levels with interval progression. There is encroachment of neural foramina by bony spurs from C3 to C7 levels. There is encroachment of neural foramina from C3-C7 levels. There is significant interval worsening of neural foraminal narrowing at C5-C6 level. Soft tissues and spinal canal: There is marked spinal stenosis at C5-C6 level which appears to be related to traumatic retrolisthesis. There is extrinsic pressure over the ventral margin of thecal sac at other levels due to cervical spondylosis, particularly prominent at C6-C7 level. Disc levels:  As described above Upper chest: Unremarkable Other: None IMPRESSION: There is traumatic recent fracture in the body of C5 vertebra. There is acute traumatic retrolisthesis at C5-C6 level causing marked spinal stenosis. Findings suggest possible extrinsic compression of cervical spinal cord at this level. Neurosurgical consultation and MRI should be considered. 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 There is encroachment of neural foramina from C3-C7 levels. There is interval progression of degenerative changes. Imaging findings were relayed to Dr. Ronnald Nian by telephone call. He was already aware of the findings and is contacting neurosurgeon. Electronically Signed   By: Elmer Picker M.D.   On: 02/14/2021 09:30   CT T-SPINE NO CHARGE  Result Date: 02/14/2021 CLINICAL DATA:  Fall EXAM: CT Thoracic and Lumbar spine without contrast TECHNIQUE: Multiplanar CT images of the thoracic and lumbar spine were reconstructed from contemporary CT of the Chest, Abdomen, and Pelvis CONTRAST:  None  COMPARISON:  None FINDINGS: CT THORACIC SPINE FINDINGS Alignment: Preserved. Vertebrae: Minor loss of height at the superior endplate of T2 is most likely chronic. No definite acute fracture identified. Paraspinal and other soft tissues: Extra-spinal findings are better evaluated on concurrent dedicated imaging. Disc levels: Mild degenerative changes are present. CT LUMBAR SPINE  FINDINGS Segmentation: 5 lumbar type vertebrae. Alignment: Preserved. Vertebrae: Vertebral body heights are maintained. No acute fracture. Paraspinal and other soft tissues: Extra-spinal findings are better evaluated on concurrent dedicated imaging. Disc levels: Partially calcified disc bulges are present at L4-L5 and L5-S1 with endplate osteophytic ridging. Facet hypertrophy at lower lumbar levels. Prior right laminectomy at L3-L4 and left laminectomy at L4-L5. IMPRESSION: No acute fracture of the thoracolumbar spine. Electronically Signed   By: Macy Mis M.D.   On: 02/14/2021 09:51   CT L-SPINE NO CHARGE  Result Date: 02/14/2021 CLINICAL DATA:  Fall EXAM: CT Thoracic and Lumbar spine without contrast TECHNIQUE: Multiplanar CT images of the thoracic and lumbar spine were reconstructed from contemporary CT of the Chest, Abdomen, and Pelvis CONTRAST:  None COMPARISON:  None FINDINGS: CT THORACIC SPINE FINDINGS Alignment: Preserved. Vertebrae: Minor loss of height at the superior endplate of T2 is most likely chronic. No definite acute fracture identified. Paraspinal and other soft tissues: Extra-spinal findings are better evaluated on concurrent dedicated imaging. Disc levels: Mild degenerative changes are present. CT LUMBAR SPINE FINDINGS Segmentation: 5 lumbar type vertebrae. Alignment: Preserved. Vertebrae: Vertebral body heights are maintained. No acute fracture. Paraspinal and other soft tissues: Extra-spinal findings are better evaluated on concurrent dedicated imaging. Disc levels: Partially calcified disc bulges are present  at L4-L5 and L5-S1 with endplate osteophytic ridging. Facet hypertrophy at lower lumbar levels. Prior right laminectomy at L3-L4 and left laminectomy at L4-L5. IMPRESSION: No acute fracture of the thoracolumbar spine. Electronically Signed   By: Macy Mis M.D.   On: 02/14/2021 09:51   CT HEAD CODE STROKE WO CONTRAST  Result Date: 02/14/2021 CLINICAL DATA:  Code stroke. Neuro deficit, acute, stroke suspected. EXAM: CT HEAD WITHOUT CONTRAST TECHNIQUE: Contiguous axial images were obtained from the base of the skull through the vertex without intravenous contrast. COMPARISON:  07/19/2019 FINDINGS: Brain: Generalized age related atrophy. Chronic small-vessel ischemic changes of the deep white matter. Old lacunar infarction left basal ganglia. No sign of acute infarction, mass lesion, hemorrhage, hydrocephalus or extra-axial collection. Vascular: No abnormal vascular finding. Skull: Normal Sinuses/Orbits: Clear/normal Other: None ASPECTS ([place] Stroke Program Early CT Score) - Ganglionic level infarction (caudate, lentiform nuclei, internal capsule, insula, M1-M3 cortex): 7 - Supraganglionic infarction (M4-M6 cortex): 3 Total score (0-10 with 10 being normal): 10 IMPRESSION: 1. No acute finding. Age related atrophy. Chronic small-vessel ischemic change of the white matter. 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 2. ASPECTS is 10. 3. These results were communicated to Dr. Curly Shores at 9:13 am on 02/14/2021 by text page via the [facility] messaging system. Electronically Signed   By: Nelson Chimes M.D.   On: 02/14/2021 09:15   CT CHEST ABDOMEN PELVIS WO CONTRAST  Result Date: 02/14/2021 CLINICAL DATA:  Trauma, fall EXAM: CT CHEST, ABDOMEN AND PELVIS WITHOUT CONTRAST TECHNIQUE: Multidetector CT imaging of the chest, abdomen and pelvis was performed following the standard protocol without IV contrast. COMPARISON:  CT abdomen done on 01/11/2019 FINDINGS: CT CHEST FINDINGS Cardiovascular: Heart is enlarged in  size. There is no demonstrable mediastinal hematoma. There are scattered calcifications in the thoracic aorta. Central pulmonary arteries are unremarkable. Mediastinum/Nodes: There is no mediastinal hematoma. Lungs/Pleura: There are small patchy infiltrates in lingula and both lower lobes. There is no pneumothorax. There is no significant pleural effusion. Musculoskeletal: No displaced fractures are seen in the chest, abdomen and pelvis. CT ABDOMEN PELVIS FINDINGS Hepatobiliary: No focal abnormality is seen in the liver. There is no dilation of bile ducts.  Gallbladder is not distended. There is subtle increased density in the neck of the gallbladder. There is no fluid around the gallbladder. Pancreas: No focal abnormality is seen. Spleen: There is no demonstrable focal laceration. There is no perisplenic fluid collection. Adrenals/Urinary Tract: Adrenals are unremarkable. There is contrast in the collecting systems, possibly related to recent intravenous contrast administration. Evaluation for small stones is limited due to contrast in the collecting systems. There is no demonstrable cortical laceration. There are few small smooth marginated fluid density lesions in the both kidneys, possibly renal cysts. Urinary bladder is unremarkable. Stomach/Bowel: Stomach is unremarkable. Small bowel loops are not dilated. Appendix is not seen. There is no significant wall thickening in the colon. There is no pericolic stranding or fluid collection. Vascular/Lymphatic: There are scattered arterial calcifications. There is 3.1 cm infrarenal aortic aneurysm. No significant lymphadenopathy seen. Reproductive: Prostate is enlarged. Other: There is no ascites or pneumoperitoneum. Small bilateral inguinal hernias containing fat are seen. Musculoskeletal: No recent displaced fracture is seen in the chest, abdomen and pelvis. IMPRESSION: There is no evidence mediastinal hematoma. Small linear patchy infiltrates in the lingula and both  lower lobes may suggest subsegmental atelectasis. There is no significant pleural effusion or pneumothorax. There is no evidence of laceration in the solid organs. There is no abnormal bowel wall thickening. There is no ascites or pneumoperitoneum. There is a 3.1 cm infrarenal aortic aneurysm. There is subtle increased density in the lumen of gallbladder suggesting possible tiny gallbladder stones. Other findings as described in the body of the report. Electronically Signed   By: Elmer Picker M.D.   On: 02/14/2021 10:01    ROS All positives and negatives listed in HPI above  Blood pressure 136/66, pulse 61, temperature (!) 97.5 F (36.4 C), temperature source Oral, resp. rate (!) 25, height 6' (1.829 m), weight 82.6 kg, SpO2 93 %. Physical Exam  Awake alert, oriented x2 PERRLA EOMI Face symmetric Cervical collar in place Bilateral upper extremity deltoid 3/5, bicep 3/5, tricep 1/5, grip/finger extension 0/5 Bilateral lower extremity 0/5 Sensory to light touch per patient is present in his anterior thigh on the left but decreased [place] sensation to light touch in the bilateral trunk  Assessment/Plan 75 year old male with  C5 fracture with significant cervical stenosis and spinal cord injury  -Stat MRI being obtained.  Discussed with the family, his wife and daughter about the likelihood of needing surgical intervention given his significant stenosis and poor neurologic exam. -Maintain normotensive pressure -Will need ICU admission -Pain control -[place], continue cervical collar    Thank you for allowing me to participate in this patient's care.  Please do not hesitate to call with questions or concerns.   Elwin Sleight, [facility] Cell: [phone]

## 2021-02-14 NOTE — ED Notes (Signed)
Patient transported to MRI 

## 2021-02-14 NOTE — ED Notes (Signed)
Pt here via EMS with c/o slurred speech, right side facial droop and weakness. Reported fall on thinners. Pt a/oX4 . Code stroke initiated by EMS

## 2021-02-14 NOTE — Anesthesia Procedure Notes (Addendum)
Procedure Name: Intubation Date/Time: 02/14/2021 2:02 PM Performed by: Jenne Campus, CRNA Pre-anesthesia Checklist: Patient identified, Emergency Drugs available, Suction available and Patient being monitored Patient Re-evaluated:Patient Re-evaluated prior to induction Oxygen Delivery Method: Circle System Utilized Preoxygenation: Pre-oxygenation with 100% oxygen Induction Type: IV induction Ventilation: Mask ventilation without difficulty Laryngoscope Size: Glidescope and 4 Grade View: Grade I Tube type: Oral Tube size: 7.5 mm Number of attempts: 1 Airway Equipment and Method: Stylet and Video-laryngoscopy Placement Confirmation: ETT inserted through vocal cords under direct vision, positive ETCO2 and breath sounds checked- equal and bilateral Secured at: 22 cm Tube secured with: Tape Dental Injury: Teeth and Oropharynx as per pre-operative assessment  Difficulty Due To: Difficult Airway- due to cervical collar

## 2021-02-14 NOTE — Anesthesia Postprocedure Evaluation (Signed)
Anesthesia Post Note  Patient: Mitchell Hunter  Procedure(s) Performed: POSTERIOR CERVICAL DECOMPRESSION CERVICAL FOUR-CERVICAL SEVEN, INSTRUMENTION AND FUSION CERVICAL THREE- THORACIC ONE     Patient location during evaluation: PACU Anesthesia Type: General Level of consciousness: sedated Pain management: pain level controlled Vital Signs Assessment: post-procedure vital signs reviewed and stable Respiratory status: spontaneous breathing and respiratory function stable Cardiovascular status: stable Postop Assessment: no apparent nausea or vomiting Anesthetic complications: no   No notable events documented.  Last Vitals:  Vitals:   02/14/21 1900 02/14/21 2000  BP: 103/61   Pulse: (!) 55   Resp: 12   Temp:  (!) 34.6 C  SpO2: 92%     Last Pain:  Vitals:   02/14/21 2000  TempSrc: Axillary  PainSc:                  [name] DANIEL

## 2021-02-14 NOTE — ED Notes (Signed)
..  Trauma Response Nurse Note-  Reason for Call / Reason for Trauma activation:  Pt in CT- was a code stroke initially for right sided weakness- results showed C-Spine fractures- pt has bilateral weakness in arms/hands, bilateral weakness and loss of sensation in legs.   Initial Focused Assessment (If applicable, or please see trauma documentation): Pt is alert/ oriented x 4, from home, fell this am, was unable to get up. Per wife, fell 2-3 weeks ago, and was able to get up but had pain in neck.   To ED via [facility]- on BB/C-Collar intact.   Interventions: Assisted in transport from CT to rm 11- assisted with removal of backboard with Dr. Ronnald Nian holding C-spine precautions. C-collar changed to ASPEN with precautions maintained.  Minimal response with grips, has sensation from umbilical area to shoulders, unable to feel pain below.   Plan of Care as of this note:  Neurology at bedside - will do MRI. Family with pt.   Rolene Arbour, RN  Trauma Response Nurse  [phone]

## 2021-02-14 NOTE — Progress Notes (Signed)
Orthopedic Tech Progress Note Patient Details:  Mitchell Hunter [birth year] [id number] Level 2 Trauma Patient ID: Mitchell Hunter, male   DOB: [birth year], 75 y.o.   MRN: [id number]  Mitchell Hunter 02/14/2021, 11:07 AM

## 2021-02-14 NOTE — Progress Notes (Signed)
Visited with patient who experienced fall..Provided emotional and spiritual support.  Patient wife presence and supporting . Will follow as needed.  Jaclynn Major, [place], [facility], Pager [phone]

## 2021-02-14 NOTE — Transfer of Care (Signed)
Immediate Anesthesia Transfer of Care Note  Patient: Mitchell Hunter  Procedure(s) Performed: POSTERIOR CERVICAL DECOMPRESSION CERVICAL FOUR-CERVICAL SEVEN, INSTRUMENTION AND FUSION CERVICAL THREE- THORACIC ONE  Patient Location: PACU  Anesthesia Type:General  Level of Consciousness: drowsy  Airway & Oxygen Therapy: Patient Spontanous Breathing and Patient connected to face mask oxygen  Post-op Assessment: Report given to RN and Post -op Vital signs reviewed and stable  Post vital signs: Reviewed and stable  Last Vitals:  Vitals Value Taken Time  BP 130/55 02/14/21 1737  Temp    Pulse 70 02/14/21 1737  Resp 15 02/14/21 1737  SpO2 100 % 02/14/21 1737  Vitals shown include unvalidated device data.  Last Pain:  Vitals:   02/14/21 1238  TempSrc: Oral  PainSc:          Complications: No notable events documented.

## 2021-02-15 ENCOUNTER — Encounter (HOSPITAL_COMMUNITY): Payer: Self-pay | Admitting: Neurological Surgery

## 2021-02-15 DIAGNOSIS — R579 Shock, unspecified: Secondary | ICD-10-CM

## 2021-02-15 LAB — BPAM PLATELET PHERESIS
Blood Product Expiration Date: 202211102359
Blood Product Expiration Date: 202211102359
ISSUE DATE / TIME: 202211101320
ISSUE DATE / TIME: 202211101320
Unit Type and Rh: 6200
Unit Type and Rh: 6200

## 2021-02-15 LAB — PREPARE PLATELET PHERESIS: Unit division: 0

## 2021-02-15 LAB — CBC
HCT: 29.4 % — ABNORMAL LOW (ref 39.0–52.0)
Hemoglobin: 9.7 g/dL — ABNORMAL LOW (ref 13.0–17.0)
MCH: 32.9 pg (ref 26.0–34.0)
MCHC: 33 g/dL (ref 30.0–36.0)
MCV: 99.7 fL (ref 80.0–100.0)
Platelets: 207 10*3/uL (ref 150–400)
RBC: 2.95 MIL/uL — ABNORMAL LOW (ref 4.22–5.81)
RDW: 14.6 % (ref 11.5–15.5)
WBC: 11.5 10*3/uL — ABNORMAL HIGH (ref 4.0–10.5)
nRBC: 0 % (ref 0.0–0.2)

## 2021-02-15 LAB — BASIC METABOLIC PANEL
Anion gap: 10 (ref 5–15)
BUN: 42 mg/dL — ABNORMAL HIGH (ref 8–23)
CO2: 18 mmol/L — ABNORMAL LOW (ref 22–32)
Calcium: 7.2 mg/dL — ABNORMAL LOW (ref 8.9–10.3)
Chloride: 107 mmol/L (ref 98–111)
Creatinine, Ser: 3.16 mg/dL — ABNORMAL HIGH (ref 0.61–1.24)
GFR, Estimated: 20 mL/min — ABNORMAL LOW (ref >60)
Glucose, Bld: 171 mg/dL — ABNORMAL HIGH (ref 70–99)
Potassium: 5.7 mmol/L — ABNORMAL HIGH (ref 3.5–5.1)
Sodium: 135 mmol/L (ref 135–145)

## 2021-02-15 MED ORDER — SODIUM CHLORIDE 0.9 % IV SOLN
INTRAVENOUS | Status: DC
Start: 2021-02-15 — End: 2021-02-16

## 2021-02-15 MED ORDER — DOCUSATE SODIUM 100 MG PO CAPS: 100.0000 mg | ORAL_CAPSULE | Freq: Two times a day (BID) | ORAL | Status: DC

## 2021-02-15 MED ORDER — POLYETHYLENE GLYCOL 3350 17 G PO PACK: 17.0000 g | PACK | Freq: Every day | ORAL | Status: DC | PRN

## 2021-02-15 MED ORDER — SODIUM ZIRCONIUM CYCLOSILICATE 5 G PO PACK
5.0000 g | PACK | Freq: Once | ORAL | Status: AC
  Administered 2021-02-16: 5 g via ORAL
  Filled 2021-02-15: qty 1

## 2021-02-15 MED ORDER — NOREPINEPHRINE 4 MG/250ML-% IV SOLN
INTRAVENOUS | Status: AC
  Filled 2021-02-15: qty 250

## 2021-02-15 MED ORDER — ALBUTEROL SULFATE (2.5 MG/3ML) 0.083% IN NEBU: 2.5000 mg | INHALATION_SOLUTION | RESPIRATORY_TRACT | Status: DC | PRN

## 2021-02-15 MED ORDER — HEPARIN SODIUM (PORCINE) 5000 UNIT/ML IJ SOLN
5000.0000 [IU] | Freq: Three times a day (TID) | INTRAMUSCULAR | Status: DC
  Administered 2021-02-16 – 2021-02-19 (×10): 5000 [IU] via SUBCUTANEOUS
  Filled 2021-02-15 (×11): qty 1

## 2021-02-15 MED ORDER — SODIUM CHLORIDE 0.9 % IV SOLN: 250.0000 mL | INTRAVENOUS | Status: DC

## 2021-02-15 MED ORDER — PHENYLEPHRINE HCL-NACL 20-0.9 MG/250ML-% IV SOLN
25.0000 ug/min | INTRAVENOUS | Status: DC
  Administered 2021-02-16: 25 ug/min via INTRAVENOUS
  Filled 2021-02-15 (×2): qty 250

## 2021-02-15 MED ORDER — SODIUM CHLORIDE 0.9 % IV BOLUS
500.0000 mL | Freq: Once | INTRAVENOUS | Status: AC
  Administered 2021-02-15: 500 mL via INTRAVENOUS

## 2021-02-15 NOTE — Progress Notes (Signed)
Will give AM pos after swallow eval

## 2021-02-15 NOTE — Progress Notes (Addendum)
Subjective: Patient reports that he is doing well. He has no complaints of pain. No acute events overnight.   Objective: Vital signs in last 24 hours: Temp:  [94.2 F (34.6 C)-98.3 F (36.8 C)] 98.3 F (36.8 C) (11/11 0400) Pulse Rate:  [53-71] 67 (11/11 0700) Resp:  [0-25] 11 (11/11 0700) BP: (99-142)/(50-70) 114/54 (11/11 0700) SpO2:  [92 %-100 %] 95 % (11/11 0700) Arterial Line BP: (108-150)/(41-77) 144/45 (11/11 0700) Weight:  [82.6 kg-83 kg] 82.6 kg (11/10 0952)  Intake/Output from previous day: 11/10 0701 - 11/11 0700 In: 2926.8 [I.V.:2050.8; Blood:576; IV [id number]:073] Out: 2075 [Urine:1750; Drains:175; Blood:150] Intake/Output this shift: No intake/output data recorded.  Physical Exam: Patient is awake, A/O X 3, and conversant. They are in NAD and VSS. Doing well. Speech is fluent and appropriate. BUE deltoid 3/5, bicep 3/5, tricep 1/5, grip/finger extension 0/5. BLE 0/5. Sensation decreased in his BLE and BUE. PERLA, EOMI. CNs grossly intact. Dressing is clean dry intact. Incision is well approximated with no drainage, erythema, or edema. Hard cervical collar in place. Hemovac with approximately 100 ml of output overnight.     Lab Results: Recent Labs    02/14/21 0857 02/14/21 0901 02/15/21 0500  WBC 6.9  --  11.5*  HGB 11.1* 11.2* 9.7*  HCT 33.0* 33.0* 29.4*  PLT 201  --  207   BMET Recent Labs    02/14/21 0857 02/14/21 0901 02/15/21 0500  NA 135 135 135  K 5.7* 5.5* 5.7*  CL 107 107 107  CO2 22  --  18*  GLUCOSE 114* 107* 171*  BUN 36* 36* 42*  CREATININE 3.21* 3.70* 3.16*  CALCIUM 8.5*  --  7.2*    Studies/Results: DG Cervical Spine 2 or 3 views  Result Date: 02/14/2021 CLINICAL DATA:  Posterior cervical decompression C4 through C7 EXAM: CERVICAL SPINE - 2-3 VIEW COMPARISON:  CT cervical spine 02/14/2021 FINDINGS: C-arm images of the cervical spine obtained. Bilateral lateral mass screws are present in the cervical spine beginning at C3 and  extending inferiorly. The lower extent of the hardware is difficult to determine on these images but may involve T1. Posterior rods not yet connected. IMPRESSION: Posterior screws of been placed in the cervical spine beginning at C3 and extending inferiorly. Lower extent of the screws is difficult to move may be T1. Follow-up radiographs suggested for level confirmation. Electronically Signed   By: Franchot Gallo M.D.   On: 02/14/2021 16:49   CT Angio Neck W and/or Wo Contrast  Result Date: 02/14/2021 CLINICAL DATA:  Spinal cord injury.  Fall with spinal fracture. EXAM: CT ANGIOGRAPHY NECK TECHNIQUE: Multidetector CT imaging of the neck was performed using the standard protocol during bolus administration of intravenous contrast. Multiplanar CT image reconstructions and MIPs were obtained to evaluate the vascular anatomy. Carotid stenosis measurements (when applicable) are obtained utilizing NASCET criteria, using the distal internal carotid diameter as the denominator. CONTRAST:  30mL OMNIPAQUE IOHEXOL 350 MG/ML SOLN COMPARISON:  Cervical spine CT today FINDINGS: Aortic arch: Normal variant 4 vessel aortic arch with the left vertebral artery arising directly from the arch. Mild atherosclerosis without arch vessel origin stenosis. Right carotid system: Patent with a small amount of calcified plaque at the carotid bifurcation. No evidence of stenosis or dissection. Left carotid system: Patent with mixed calcified and soft plaque in the mid common carotid artery resulting in 50% stenosis. Mixed calcified and soft plaque in the carotid bulb without significant ICA stenosis or dissection. Vertebral arteries: The vertebral arteries are  patent and codominant without evidence of a significant stenosis on the left. There is approximately 50% narrowing of the right vertebral artery at C5-6 which appears at least partially due to extrinsic compression from traumatic retrolisthesis at this level although there is also  some luminal irregularity raising concern for intimal injury as well. No discrete intimal flap, discrete intramural hematoma, or pseudoaneurysm is identified. Skeleton: C5 fractures involving the vertebral body and right transverse foramen with retrolisthesis as shown on earlier spine CT. Other neck: No evidence of cervical lymphadenopathy or mass. Mild soft tissue swelling/contusion in the right neck. Upper chest: Reported separately. IMPRESSION: 1. 50% narrowing of the right vertebral artery at C5-6 which may be due to a combination of extrinsic compression from retrolisthesis at this level and mild intimal injury. 2. 50% mid left common carotid artery stenosis. 3. C5 fractures as shown on earlier spine CT. 4. Aortic Atherosclerosis (ICD10-I70.0). Electronically Signed   By: Logan Bores M.D.   On: 02/14/2021 10:01   CT Cervical Spine Wo Contrast  Result Date: 02/14/2021 CLINICAL DATA:  Trauma, fall EXAM: CT CERVICAL SPINE WITHOUT CONTRAST TECHNIQUE: Multidetector CT imaging of the cervical spine was performed without intravenous contrast. Multiplanar CT image reconstructions were also generated. COMPARISON:  07/01/2018 FINDINGS: Alignment: There is acute 4 mm retrolisthesis at C5-C6 level. There is marked narrowing of AP diameter of spinal canal measuring 9 mm. These findings were not evident in the previous examination. Skull base and vertebrae: There is new fracture in the anterior aspect of body C5 vertebra. Degenerative changes are noted with bony spurs at multiple levels with interval progression. There is encroachment of neural foramina by bony spurs from C3 to C7 levels. There is encroachment of neural foramina from C3-C7 levels. There is significant interval worsening of neural foraminal narrowing at C5-C6 level. Soft tissues and spinal canal: There is marked spinal stenosis at C5-C6 level which appears to be related to traumatic retrolisthesis. There is extrinsic pressure over the ventral margin of  thecal sac at other levels due to cervical spondylosis, particularly prominent at C6-C7 level. Disc levels:  As described above Upper chest: Unremarkable Other: None IMPRESSION: There is traumatic recent fracture in the body of C5 vertebra. There is acute traumatic retrolisthesis at C5-C6 level causing marked spinal stenosis. Findings suggest possible extrinsic compression of cervical spinal cord at this level. Neurosurgical consultation and MRI should be considered. 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 There is encroachment of neural foramina from C3-C7 levels. There is interval progression of degenerative changes. Imaging findings were relayed to Dr. Ronnald Nian by telephone call. He was already aware of the findings and is contacting neurosurgeon. Electronically Signed   By: Elmer Picker M.D.   On: 02/14/2021 09:30   MR CERVICAL SPINE WO CONTRAST  Result Date: 02/14/2021 CLINICAL DATA:  Acute or progressive myelopathy. Cervical spine fracture EXAM: MRI CERVICAL SPINE WITHOUT CONTRAST TECHNIQUE: Multiplanar, multisequence MR imaging of the cervical spine was performed. No intravenous contrast was administered. COMPARISON:  Cervical spine CT from earlier the same day FINDINGS: Alignment: Mild C5-6 retrolisthesis. Vertebrae: Marrow edema on both sides of the C5-6 disc which is diffusely T2 hyperintense. No endplate destruction by MRI or CT. Extensive prevertebral effusion. Buckling of the ligamentum flavum at C5-6 and C6-7 without apparent discontinuity. A small dorsal epidural collection is seen at the level of C4-5, presumably blood in the setting of trauma. T2 superior endplate fracture with marrow edema and mild depression. Cord: Cord hyperintensity  centered on the level of compression at C5-6. Posterior Fossa, vertebral arteries, paraspinal tissues: Prevertebral effusion as described. Disc levels: C2-3: Ankylosis.  No impingement C3-4: Ankylosis with asymmetric  right facet spurring. Mild right foraminal impingement C4-5: Disc narrowing and bulging with bilateral uncovertebral spurring. Bilateral facet spurring. Mild spinal stenosis. Mild-to-moderate right foraminal narrowing C5-6: Disc collapse and bulging with ridging. Ligamentum flavum thickening. Compressive spinal stenosis and biforaminal impingement. C6-7: Disc narrowing and bulging with uncovertebral ridging. Biforaminal impingement. Spinal stenosis with mild cord deformity C7-T1:Disc narrowing and bulging with mild facet spurring. IMPRESSION: 1. Known C5 fracture with marrow edema throughout the C5 and C6 vertebral bodies and intervening disc space, likely traumatic in this setting although please correlate for clinical signs of infection. The disc T2 hyperintensity suggest disc injury in addition to the nondisplaced C5 body fracture. Degenerative disease and mild retrolisthesis causes cord compression and edema. Advanced biforaminal impingement at the same level. 2. Minimally depressed acute T2 superior endplate fracture. 3. C6-7 moderate spinal stenosis due to degenerative disease. Moderate right foraminal narrowing at this level. 4. C2-C4 ankylosis. 5. Prevertebral effusion/hematoma. Electronically Signed   By: Jorje Guild M.D.   On: 02/14/2021 11:32   CT T-SPINE NO CHARGE  Result Date: 02/14/2021 CLINICAL DATA:  Fall EXAM: CT Thoracic and Lumbar spine without contrast TECHNIQUE: Multiplanar CT images of the thoracic and lumbar spine were reconstructed from contemporary CT of the Chest, Abdomen, and Pelvis CONTRAST:  None COMPARISON:  None FINDINGS: CT THORACIC SPINE FINDINGS Alignment: Preserved. Vertebrae: Minor loss of height at the superior endplate of T2 is most likely chronic. No definite acute fracture identified. Paraspinal and other soft tissues: Extra-spinal findings are better evaluated on concurrent dedicated imaging. Disc levels: Mild degenerative changes are present. CT LUMBAR SPINE  FINDINGS Segmentation: 5 lumbar type vertebrae. Alignment: Preserved. Vertebrae: Vertebral body heights are maintained. No acute fracture. Paraspinal and other soft tissues: Extra-spinal findings are better evaluated on concurrent dedicated imaging. Disc levels: Partially calcified disc bulges are present at L4-L5 and L5-S1 with endplate osteophytic ridging. Facet hypertrophy at lower lumbar levels. Prior right laminectomy at L3-L4 and left laminectomy at L4-L5. IMPRESSION: No acute fracture of the thoracolumbar spine. Electronically Signed   By: Macy Mis M.D.   On: 02/14/2021 09:51   CT L-SPINE NO CHARGE  Result Date: 02/14/2021 CLINICAL DATA:  Fall EXAM: CT Thoracic and Lumbar spine without contrast TECHNIQUE: Multiplanar CT images of the thoracic and lumbar spine were reconstructed from contemporary CT of the Chest, Abdomen, and Pelvis CONTRAST:  None COMPARISON:  None FINDINGS: CT THORACIC SPINE FINDINGS Alignment: Preserved. Vertebrae: Minor loss of height at the superior endplate of T2 is most likely chronic. No definite acute fracture identified. Paraspinal and other soft tissues: Extra-spinal findings are better evaluated on concurrent dedicated imaging. Disc levels: Mild degenerative changes are present. CT LUMBAR SPINE FINDINGS Segmentation: 5 lumbar type vertebrae. Alignment: Preserved. Vertebrae: Vertebral body heights are maintained. No acute fracture. Paraspinal and other soft tissues: Extra-spinal findings are better evaluated on concurrent dedicated imaging. Disc levels: Partially calcified disc bulges are present at L4-L5 and L5-S1 with endplate osteophytic ridging. Facet hypertrophy at lower lumbar levels. Prior right laminectomy at L3-L4 and left laminectomy at L4-L5. IMPRESSION: No acute fracture of the thoracolumbar spine. Electronically Signed   By: Macy Mis M.D.   On: 02/14/2021 09:51   DG C-Arm 1-60 Min-No Report  Result Date: 02/14/2021 Fluoroscopy was utilized by the  requesting physician.  No radiographic interpretation.  DG C-Arm 1-60 Min-No Report  Result Date: 02/14/2021 Fluoroscopy was utilized by the requesting physician.  No radiographic interpretation.   DG C-Arm 1-60 Min-No Report  Result Date: 02/14/2021 Fluoroscopy was utilized by the requesting physician.  No radiographic interpretation.   CT HEAD CODE STROKE WO CONTRAST  Result Date: 02/14/2021 CLINICAL DATA:  Code stroke. Neuro deficit, acute, stroke suspected. EXAM: CT HEAD WITHOUT CONTRAST TECHNIQUE: Contiguous axial images were obtained from the base of the skull through the vertex without intravenous contrast. COMPARISON:  07/19/2019 FINDINGS: Brain: Generalized age related atrophy. Chronic small-vessel ischemic changes of the deep white matter. Old lacunar infarction left basal ganglia. No sign of acute infarction, mass lesion, hemorrhage, hydrocephalus or extra-axial collection. Vascular: No abnormal vascular finding. Skull: Normal Sinuses/Orbits: Clear/normal Other: None ASPECTS ([place] Stroke Program Early CT Score) - Ganglionic level infarction (caudate, lentiform nuclei, internal capsule, insula, M1-M3 cortex): 7 - Supraganglionic infarction (M4-M6 cortex): 3 Total score (0-10 with 10 being normal): 10 IMPRESSION: 1. No acute finding. Age related atrophy. Chronic small-vessel ischemic change of the white matter. 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 2. ASPECTS is 10. 3. These results were communicated to Dr. Curly Shores at 9:13 am on 02/14/2021 by text page via the [facility] messaging system. Electronically Signed   By: Nelson Chimes M.D.   On: 02/14/2021 09:15   CT CHEST ABDOMEN PELVIS WO CONTRAST  Result Date: 02/14/2021 CLINICAL DATA:  Trauma, fall EXAM: CT CHEST, ABDOMEN AND PELVIS WITHOUT CONTRAST TECHNIQUE: Multidetector CT imaging of the chest, abdomen and pelvis was performed following the standard protocol without IV contrast. COMPARISON:  CT abdomen done on 01/11/2019  FINDINGS: CT CHEST FINDINGS Cardiovascular: Heart is enlarged in size. There is no demonstrable mediastinal hematoma. There are scattered calcifications in the thoracic aorta. Central pulmonary arteries are unremarkable. Mediastinum/Nodes: There is no mediastinal hematoma. Lungs/Pleura: There are small patchy infiltrates in lingula and both lower lobes. There is no pneumothorax. There is no significant pleural effusion. Musculoskeletal: No displaced fractures are seen in the chest, abdomen and pelvis. CT ABDOMEN PELVIS FINDINGS Hepatobiliary: No focal abnormality is seen in the liver. There is no dilation of bile ducts. Gallbladder is not distended. There is subtle increased density in the neck of the gallbladder. There is no fluid around the gallbladder. Pancreas: No focal abnormality is seen. Spleen: There is no demonstrable focal laceration. There is no perisplenic fluid collection. Adrenals/Urinary Tract: Adrenals are unremarkable. There is contrast in the collecting systems, possibly related to recent intravenous contrast administration. Evaluation for small stones is limited due to contrast in the collecting systems. There is no demonstrable cortical laceration. There are few small smooth marginated fluid density lesions in the both kidneys, possibly renal cysts. Urinary bladder is unremarkable. Stomach/Bowel: Stomach is unremarkable. Small bowel loops are not dilated. Appendix is not seen. There is no significant wall thickening in the colon. There is no pericolic stranding or fluid collection. Vascular/Lymphatic: There are scattered arterial calcifications. There is 3.1 cm infrarenal aortic aneurysm. No significant lymphadenopathy seen. Reproductive: Prostate is enlarged. Other: There is no ascites or pneumoperitoneum. Small bilateral inguinal hernias containing fat are seen. Musculoskeletal: No recent displaced fracture is seen in the chest, abdomen and pelvis. IMPRESSION: There is no evidence mediastinal  hematoma. Small linear patchy infiltrates in the lingula and both lower lobes may suggest subsegmental atelectasis. There is no significant pleural effusion or pneumothorax. There is no evidence of laceration in the solid organs. There is no abnormal bowel wall thickening. There is  no ascites or pneumoperitoneum. There is a 3.1 cm infrarenal aortic aneurysm. There is subtle increased density in the lumen of gallbladder suggesting possible tiny gallbladder stones. Other findings as described in the body of the report. Electronically Signed   By: Elmer Picker M.D.   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 He is post op day #1 s/p posterior cervical decompression, laminectomy C4-7 with posterior instrumentation C3-T1. The patient is recovering well. He has no complaints of pain. His neurological examination remains severely impaired due to his injury.    -Maintain normotensive pressure -Pain control -[place] -Continue cervical collar -Continue hemovac -SLP eval -PT/OT   LOS: 1 day     Marvis Moeller, DNP, NP-C 02/15/2021, 8:29 AM   ADDENDUM:  Pt s/e, agree with above   Will consult CCM for SBP management and medical management given soft BP today Cont supportive care for SCI

## 2021-02-15 NOTE — Progress Notes (Signed)
Occupational Therapy Evaluation  PTA pt lives at home with his wife and states he was independent with mobility and ADL tasks. Per chart review pt with recent fall 3 weeks ago, however pt states fall was "years" ago; he also thinks it is 2019. Pt with limited BUE AROM; no active elbow extension or wrist flex/extension or use of hands. Total A with ADL and Total A +2 with mobility. Will benefit from B PRAFO splints in addition to B resting hand splints. Will benefit form rehab at [facility]. Will follow acutely.    02/15/21 1300  OT Visit Information  Last OT Received On 02/15/21  Assistance Needed +2  PT/OT/SLP Co-Evaluation/Treatment Yes  Reason for Co-Treatment Complexity of the patient's impairments (multi-system involvement);For patient/therapist safety;To address functional/ADL transfers  OT goals addressed during session ADL's and self-care  History of Present Illness 75 y/o male presented to ED on 11/10 after falling backwards and hitting head. CT C-spine showed traumatic recent fx in body of C5 vertebra and retrolisthesis at C5-6 level with advanced biforaminal impingement at same level. S/p posterior cervical decompression C4-C7 and fusion C3-T1 on 11/10.  PMH: Parkinson's disease, hypertension, hyperlipidemia, diabetes, gout, anxiety, COPD  Precautions  Precautions Fall;Cervical  Precaution Booklet Issued No  Required Braces or Orthoses Cervical Brace  Cervical Brace Hard collar (may remove while in bed)  Home Living  Family/patient expects to be discharged to: Private residence  Living Arrangements Spouse/significant other  Type of [place] to enter  Entrance Stairs-Number of Steps 4  Entrance Stairs-Rails Right;Left;Can reach both  [place] One level  Bathroom Shower/Tub Tub/shower unit;Curtain  Bathroom Toilet Handicapped height  Bathroom Accessibility Yes  Home Equipment Grab bars - tub/shower;Grab bars - toilet;Cane - single point;Rolling Walker (2  wheels)  Prior Function  Prior Level of Function  Independent/Modified Independent;History of Falls (last six months)  Mobility Comments fall x 3 weeks ago per chart. Patient reports falling 2 years ago  ADLs Comments Pt states he was independent and enjoys Chief Operating Officer Other (comment) (soft spoken)  Pain Assessment  Pain Assessment Faces  Faces Pain Scale 0  Cognition  Arousal/Alertness Lethargic;Suspect due to medications  Behavior During Therapy Flat affect  Overall Cognitive Status Impaired/Different from baseline  Area of Impairment Orientation;Attention;Following commands;Safety/judgement;Awareness;Problem solving;Memory  Orientation Level Disoriented to;Time;Situation  Current Attention Level Sustained  Memory Decreased short-term memory  Following Commands Follows one step commands with increased time  Safety/Judgement Decreased awareness of deficits;Decreased awareness of safety  Awareness Emergent  Problem Solving Slow processing  General Comments appears confused at times but no family present to provide baseline. Following commands appropriately. Stated wife's name initally as Aldona Bar but states Apolonio Schneiders (which is correct) on second attempt. Unable to state name of hospital. Able to recall that he fell and injured his neck but does not recall surgery. Patient states "2019"  Upper Extremity Assessment  Upper Extremity Assessment RUE deficits/detail;LUE deficits/detail  RUE Deficits / Details shoulder abduction/flex @ 3/5; difficulty with ER; elbow flex 3/5 - able to grossly bring to mouth; no active elbow extension; trace active wrist flex; no active wrist ext or finger movement observed; nonfunctional grasp at this time; will need WHO  RUE Sensation decreased light touch;decreased proprioception  RUE Coordination decreased fine motor;decreased gross motor  LUE Deficits / Details similar to RUE  LUE Coordination decreased fine motor;decreased gross  motor  Cervical / Trunk Assessment  Cervical / Trunk Assessment Neck Surgery  Vision- History  Patient Visual Report No change from baseline  ADL  Overall ADL's  Needs assistance/impaired  General ADL Comments total A at this time with all ADL  Bed Mobility  General bed mobility comments placed patient in chair position in bed due to low BPs and lethargy  Transfers  General transfer comment defer at this time  Balance  Overall balance assessment Needs assistance  Sitting balance-Leahy Scale Zero  Sitting balance - Comments unable to maintain upright posture in supported sitting in bed  General Comments  General comments (skin integrity, edema, etc.) placed mepelex pads under cervical collar to prevent skin breakdown. On 2L O2 [place] initially, spO2 97%. Removed for session with patient spO2 89%. Donned 2L O2 [place] at end of session  Exercises  Exercises General Upper Extremity  General Exercises - Upper Extremity  Shoulder ABduction AAROM;Both;10 reps  Elbow Extension AAROM;Both;10 reps  Shoulder Flexion AAROM;Both;10 reps  Elbow Flexion AAROM;Both;10 reps  Digit Composite Flexion PROM;Both;10 reps  Composite Extension PROM;Both;10 reps  OT - End of Session  Equipment Utilized During Treatment Oxygen;Cervical collar  Activity Tolerance Patient tolerated treatment well  Patient left with call bell/phone within reach;with bed alarm set;with SCD's reapplied;in bed (chair position)  Nurse Communication Mobility status;Other (comment) (need for B PRAFO; hand splints)  OT Assessment  OT Recommendation/Assessment Patient needs continued OT Services  OT Visit Diagnosis Unsteadiness on feet (R26.81);Other abnormalities of gait and mobility (R26.89);Repeated falls (R29.6);Muscle weakness (generalized) (M62.81);Other symptoms and signs involving the nervous system (R29.898)  OT Problem List Decreased strength;Decreased range of motion;Decreased activity tolerance;Impaired balance (sitting and/or  standing);Decreased coordination;Decreased cognition;Decreased safety awareness;Decreased knowledge of use of DME or AE;Decreased knowledge of precautions;Cardiopulmonary status limiting activity;Impaired sensation;Impaired tone;Impaired UE functional use;Increased edema  OT Plan  OT Frequency (ACUTE ONLY) Min 2X/week  OT Treatment/Interventions (ACUTE ONLY) Self-care/ADL training;Therapeutic exercise;Neuromuscular education;DME and/or AE instruction;Energy conservation;Splinting;Therapeutic activities;Cognitive remediation/compensation;Visual/perceptual remediation/compensation;Patient/family education;Balance training  AM-PAC OT "6 Clicks" Daily Activity Outcome Measure (Version 2)  Help from another person eating meals? 1  Help from another person taking care of personal grooming? 1  Help from another person toileting, which includes using toliet, bedpan, or urinal? 1  Help from another person bathing (including washing, rinsing, drying)? 1  Help from another person to put on and taking off regular upper body clothing? 1  Help from another person to put on and taking off regular lower body clothing? 1  6 Click Score 6  Progressive Mobility  What is the highest level of mobility based on the progressive mobility assessment? Level 1 (Bedfast) - Unable to balance while sitting on edge of bed  Mobility Sit up in bed/chair position for meals  OT Recommendation  Recommendations for Other Services Other (comment) (Palliative Care Consult)  Follow Up Recommendations Skilled nursing-short term rehab (<3 hours/day)  Assistance recommended at discharge Frequent or constant Supervision/Assistance  Functional Status Assessent Patient has had a recent decline in their functional status and demonstrates the ability to make significant improvements in function in a reasonable and predictable amount of time.  OT Equipment BSC/3in1;Wheelchair (measurements OT);Wheelchair cushion (measurements OT);Hospital  bed;Other (comment) Harrel Lemon)  Individuals Consulted  Consulted and Agree with Results and Recommendations Patient unable/family or caregiver not available  Acute Rehab OT Goals  Patient Stated Goal none stated  OT Goal Formulation Patient unable to participate in goal setting  Time For Goal Achievement 03/01/21  Potential to Achieve Goals Fair  OT Time Calculation  OT Start Time (ACUTE ONLY) 0947  OT Stop Time (ACUTE  ONLY) 1015  OT Time Calculation (min) 28 min  OT General Charges  $OT Visit 1 Visit  OT Evaluation  $OT Eval High Complexity 1 High  Written Expression  Dominant Hand Right  Maurie Boettcher, OT/L   Acute OT Clinical Specialist Acute Rehabilitation Services Pager [phone] Office [phone]

## 2021-02-15 NOTE — Consult Note (Signed)
NAME:  Mitchell Hunter, MRN:  [id number], DOB:  January 12, 1946, LOS: 1 ADMISSION DATE:  02/14/2021, CONSULTATION DATE:  02/15/2021 REFERRING MD:  Dr. Reatha Armour, CHIEF COMPLAINT:  Fall   History of Present Illness:   75 year old with prior history of COPD, HTN, HLD, DM, parkinson's disease, and CKD who presented to [facility] after a fall.   Reports he fell several weeks ago but did not seek treatment.  He fell on the morning of 10/11, backwards hitting his head after he lost his balance.  No loss of consciousness, but following fall, he was noted to have weakness and altered sensation in his upper and lower extremities, as well as neck pain, and some dysarthria.  He was maintained in cervical collar.  He presented as a code stroke with EMS.   He was found on stroke workup to have acute fracture of C5 anterior vertebral body fracture with retrolisthesis and severe cervical stenosis.  NSGY was emergently consulted, who admitted patient to Neuro ICU.  He underwent MRI which confirmed severe stenosis C5-6 with cord compression and moderate to severe stenosis at C6-7, with cord signal change.  He was taken for emergent posterior cervical decompression and laminectomy C4-7 with posterior instrumentation C3-T1 that evening by Dr. Reatha Armour.  Today, he has had lower pressure with MAP in the 50-60s, with ideal goal of 70-75 per neurosurgery despite fluid bolus.  Also noted to have worsening renal function.   PCCM consulted to assist with medical management.    Pertinent  Medical History  COPD, HTN, HLD, DM, parkinson's, anxiety, gout, CKD 3B, chronic low back and neck pain  Significant Hospital Events: Including procedures, antibiotic start and stop dates in addition to other pertinent events   11/10 admitted s/p fall,   Interim History / Subjective:   Objective   Blood pressure (!) 84/48, pulse 63, temperature 98.3 F (36.8 C), temperature source Oral, resp. rate 19, height 6' (1.829 m), weight 82.6 kg, SpO2 97 %.         Intake/Output Summary (Last 24 hours) at 02/15/2021 1807 Last data filed at 02/15/2021 1200 Gross per 24 hour  Intake 650.81 ml  Output 795 ml  Net -144.19 ml   Filed Weights   02/14/21 0900 02/14/21 0952  Weight: 83 kg 82.6 kg   Examination: General:  Pleasant older male lying in bed in NAD HEENT: MM pink/dry, pupils 3/reactive, aspen collar in place, dressing not visualized (posterior), w/Hemovac Neuro: Alert, oriented, slight gross movement in RUE and LLE, reports no sensation in LE, decreased in upper extremities, diminished from nipple line down CV: NSR, 2+ dp PULM:  non labored, clear and diminished in bases, no wheeze GI: soft, bs+ active, foley  Extremities: warm/dry, no pedal edema  Skin: no rashes   Resolved Hospital Problem list     Assessment & Plan:   C5 fracture with severe cervical spinal stenosis and spinal cord injury  - post op day 1, posterior cervical decompression, laminectomy C4-7 with posterior instrumentation C3-T1 with quadriplegia - passed SLP- FEES today - HR remains stable in the 60's P:  - per NSGY -  s/p NS bolus 564ml.  Will give another 500 ml bolus and continue MIVF NS at 159ml/hr.  He is drinking now which will add fluids. - start peripheral Neo for MAP goal 70-75 for spinal perfusion - ongoing c-collar - PT/ OT - ongoing multimodal pain control with good bowel regimen   AKI on CKD stage 3 Mild hyperkalemia without EKG  changes - likely due to hypoperfusion - ongoing gentle hydration as above - recheck BMET now - continue foley - trend renal indices/ strict I/Os - if worsening will need further workup- UA, [place] r/o obstruction, lytes   Hx HTN - holding antihypertensives for MAP goal 70-75   Hx COPD Current tobacco abuse - has rescue inhaler he uses only - will add prn albuterol as needed  - encourage IS frequently - tobacco cessation when appropriate    Parkinson's Disease - cont sinemet  Best Practice (right click  and "Reselect all SmartList Selections" daily)   Diet/type: Regular consistency (see orders)- per SLP recs DVT prophylaxis: prophylactic heparin  GI prophylaxis: PPI Lines: N/A Foley:  Yes, and it is still needed Code Status:  full code Last date of multidisciplinary goals of care discussion [per primary ]  Labs   CBC: Recent Labs  Lab 02/14/21 0857 02/14/21 0901 02/15/21 0500  WBC 6.9  --  11.5*  NEUTROABS 4.6  --   --   HGB 11.1* 11.2* 9.7*  HCT 33.0* 33.0* 29.4*  MCV 98.8  --  99.7  PLT 201  --  454    Basic Metabolic Panel: Recent Labs  Lab 02/14/21 0857 02/14/21 0901 02/15/21 0500  NA 135 135 135  K 5.7* 5.5* 5.7*  CL 107 107 107  CO2 22  --  18*  GLUCOSE 114* 107* 171*  BUN 36* 36* 42*  CREATININE 3.21* 3.70* 3.16*  CALCIUM 8.5*  --  7.2*   GFR: Estimated Creatinine Clearance: 22.2 mL/min (A) (by C-G formula based on SCr of 3.16 mg/dL (H)). Recent Labs  Lab 02/14/21 0857 02/15/21 0500  WBC 6.9 11.5*    Liver Function Tests: Recent Labs  Lab 02/14/21 0857  AST 15  ALT <5  ALKPHOS 79  BILITOT 0.7  PROT 5.3*  ALBUMIN 2.4*   No results for input(s): LIPASE, AMYLASE in the last 168 hours. No results for input(s): AMMONIA in the last 168 hours.  ABG    Component Value Date/Time   TCO2 22 02/14/2021 0901     Coagulation Profile: Recent Labs  Lab 02/14/21 0857  INR 1.0    Cardiac Enzymes: No results for input(s): CKTOTAL, CKMB, CKMBINDEX, TROPONINI in the last 168 hours.  HbA1C: No results found for: HGBA1C  CBG: Recent Labs  Lab 02/14/21 0857 02/14/21 1234 02/14/21 1736  GLUCAP 108* 120* 176*    Review of Systems:   Review of Systems  Constitutional:  Negative for chills and fever.  Respiratory:  Negative for cough, sputum production, shortness of breath and wheezing.   Cardiovascular:  Negative for leg swelling.  Gastrointestinal:  Negative for nausea.  Musculoskeletal:  Positive for falls.  Neurological:  Positive for  dizziness, sensory change, focal weakness and weakness. Negative for loss of consciousness.    Past Medical History:  He,  has a past medical history of Anxiety, Chronic renal insufficiency, COPD (chronic obstructive pulmonary disease) ([place]), Diabetes mellitus without complication ([place]), Gout, Hyperlipidemia, Hypertension, Parkinsonism ([place]), and Pneumonia.   Surgical History:   Past Surgical History:  Procedure Laterality Date   APPENDECTOMY     ELBOW ARTHROSCOPY Right    torn cartilage   KNEE ARTHROSCOPY Left    cartilage   KNEE ARTHROSCOPY Right 07/22/2013   Procedure: ARTHROSCOPY KNEE;  Surgeon: Augustin Schooling, MD;  Location: [facility];  Service: Orthopedics;  Laterality: Right;   LUMBAR LAMINECTOMY/DECOMPRESSION MICRODISCECTOMY N/A 05/04/2013   Procedure: Right Lumbar Three-Four laminectomy and microdiskectomy ;  Surgeon: Hosie Spangle, MD;  Location: [place] [facility];  Service: Neurosurgery;  Laterality: N/A;  Right Lumbar Three-Four laminectomy and microdiskectomy    POSTERIOR CERVICAL FUSION/FORAMINOTOMY N/A 02/14/2021   Procedure: POSTERIOR CERVICAL DECOMPRESSION CERVICAL FOUR-CERVICAL SEVEN, INSTRUMENTION AND FUSION CERVICAL THREE- THORACIC ONE;  Surgeon: Karsten Ro, DO;  Location: [facility];  Service: Neurosurgery;  Laterality: N/A;  POSTERIOR CERVICAL DECOMPRESSION CERVICAL FOUR-CERVICAL SEVEN, INSTRUMENTION AND FUSION CERVICAL THREE- THORACIC ONE   RENAL BIOPSY  01/2019     Social History:   reports that he has been smoking cigarettes. He has a 75.00 pack-year smoking history. He quit smokeless tobacco use about 33 years ago. He reports current alcohol use. He reports that he does not use drugs.   Family History:  His family history includes Alcohol abuse in his father; Dementia in his mother; Glaucoma in his mother; Lung disease in his father; Parkinsonism in his brother.   Allergies Allergies  Allergen Reactions   Adhesive [Tape] Other (See Comments)    SKIN IS VERY THIN  AND WILL TEAR EASILY- WILL NEED EITHER A TAPE ALTERNATIVE OR PAPER TAPE     Home Medications  Prior to Admission medications   Medication Sig Start Date End Date Taking? Authorizing Provider  acetaminophen (TYLENOL) 650 MG CR tablet Take 650-1,300 mg by mouth 3 (three) times daily as needed for pain.   Yes [provider]  allopurinol (ZYLOPRIM) 100 MG tablet Take 100 mg by mouth 2 (two) times daily. 04/19/13  Yes [provider]  ALPRAZolam (XANAX) 0.25 MG tablet Take 0.25-0.5 mg by mouth 2 (two) times daily as needed for sleep (or tremors).   Yes [provider]  amLODipine (NORVASC) 10 MG tablet Take 1 tablet (10 mg total) by mouth daily. 07/03/18  Yes Shelly Coss, MD  aspirin EC 81 MG tablet Take 81 mg by mouth every other day.   Yes [provider]  benazepril (LOTENSIN) 20 MG tablet Take 20 mg by mouth daily.   Yes [provider]  carbidopa-levodopa (SINEMET IR) 25-100 MG tablet Take 2 tablets by mouth 3 (three) times daily before meals. Patient taking differently: Take 3 tablets by mouth See admin instructions. Take 3 tablets by mouth in the morning and evening 07/24/20  Yes Penumalli, Earlean Polka, MD  cetirizine (ZYRTEC) 10 MG tablet Take 10 mg by mouth daily.   Yes [provider]  Cholecalciferol (VITAMIN D3) 1.25 MG (50000 UT) CAPS Take 50,000 Units by mouth every Sunday.   Yes [provider]  escitalopram (LEXAPRO) 10 MG tablet Take 10 mg by mouth daily. 06/07/19  Yes [provider]  folic acid (FOLVITE) 1 MG tablet Take 1 tablet (1 mg total) by mouth daily. 07/03/18  Yes Shelly Coss, MD  gabapentin (NEURONTIN) 600 MG tablet Take 600 mg by mouth in the morning and at bedtime.   Yes [provider]  Multiple Vitamin (MULTIVITAMIN) capsule Take 1 capsule by mouth daily.   Yes [provider]  Multiple Vitamins-Minerals (PRESERVISION AREDS 2 PO) Take 1 capsule by mouth 2 (two) times daily.   Yes  [provider]  Omega-3 Fatty Acids (FISH OIL) 1000 MG CAPS Take 1,000 mg by mouth 2 (two) times daily.    Yes [provider]  omeprazole (PRILOSEC) 20 MG capsule Take 20 mg by mouth daily before breakfast.   Yes [provider]  promethazine (PHENERGAN) 12.5 MG tablet Take 12.5 mg by mouth every 8 (eight) hours as needed for  nausea or vomiting.  01/27/18  Yes [provider]  thiamine 100 MG tablet Take 1 tablet (100 mg total) by mouth daily. 07/03/18  Yes Shelly Coss, MD  tiZANidine (ZANAFLEX) 4 MG capsule Take 4 mg by mouth 3 (three) times daily as needed for muscle spasms. 01/20/19  Yes [provider]  triamcinolone cream (KENALOG) 0.1 % Apply 1 application topically as needed (for rashes or irritation).  09/21/17  Yes [provider]  dapagliflozin propanediol (FARXIGA) 10 MG TABS tablet Take 1 tablet by mouth daily. Patient not taking: No sig reported 08/18/19   [provider]  HYDROcodone-acetaminophen (NORCO) 10-325 MG tablet Take 1 tablet by mouth daily as needed (for pain). 09/20/20   [provider]     Critical care time: 40 mins       Kennieth Rad, ACNP [facility] 02/15/2021, 7:00 PM  See Amion for pager If no response to pager, please call PCCM consult pager After 7:00 pm call Elink

## 2021-02-15 NOTE — Progress Notes (Signed)
[facility] Progress Note Patient Name: Mitchell Hunter DOB: Jan 25, 1946 MRN: [id number]   Date of Service  02/15/2021  HPI/Events of Note  K+ 5.7  eICU Interventions  Lokelma 5 gm po x 1 ordered.        [name] 02/15/2021, 10:15 PM

## 2021-02-15 NOTE — Evaluation (Signed)
Clinical/Bedside Swallow Evaluation Patient Details  Name: Mitchell Hunter MRN: [id number] Date of Birth: 1946-02-17  Today's Date: 02/15/2021 Time: SLP Start Time (ACUTE ONLY): 1135 SLP Stop Time (ACUTE ONLY): 1150 SLP Time Calculation (min) (ACUTE ONLY): 15 min  Past Medical History:  Past Medical History:  Diagnosis Date   Anxiety    Chronic renal insufficiency    COPD (chronic obstructive pulmonary disease) ([place])    Diabetes mellitus without complication ([place])    Gout    Hyperlipidemia    Hypertension    Parkinsonism ([place])    Pneumonia    hx   Past Surgical History:  Past Surgical History:  Procedure Laterality Date   APPENDECTOMY     ELBOW ARTHROSCOPY Right    torn cartilage   KNEE ARTHROSCOPY Left    cartilage   KNEE ARTHROSCOPY Right 07/22/2013   Procedure: ARTHROSCOPY KNEE;  Surgeon: Mitchell Schooling, MD;  Location: [facility];  Service: Orthopedics;  Laterality: Right;   LUMBAR LAMINECTOMY/DECOMPRESSION MICRODISCECTOMY N/A 05/04/2013   Procedure: Right Lumbar Three-Four laminectomy and microdiskectomy ;  Surgeon: Mitchell Spangle, MD;  Location: [facility];  Service: Neurosurgery;  Laterality: N/A;  Right Lumbar Three-Four laminectomy and microdiskectomy    POSTERIOR CERVICAL FUSION/FORAMINOTOMY N/A 02/14/2021   Procedure: POSTERIOR CERVICAL DECOMPRESSION CERVICAL FOUR-CERVICAL SEVEN, INSTRUMENTION AND FUSION CERVICAL THREE- THORACIC ONE;  Surgeon: Mitchell Ro, DO;  Location: [facility];  Service: Neurosurgery;  Laterality: N/A;  POSTERIOR CERVICAL DECOMPRESSION CERVICAL FOUR-CERVICAL SEVEN, INSTRUMENTION AND FUSION CERVICAL THREE- THORACIC ONE   RENAL BIOPSY  01/2019   HPI:  75 y/o male presented to ED on 11/10 after falling backwards and hitting head. CT C-spine showed traumatic recent fx in body of C5 vertebra and retrolisthesis at C5-6 level with advanced biforaminal impingement at same level. S/p posterior cervical decompression C4-C7 and fusion C3-T1 on 11/10.  PMH:  Parkinson's disease, hypertension, hyperlipidemia, diabetes, gout, anxiety, COPD    Assessment / Plan / Recommendation  Clinical Impression  Pt presents with concerns for an acute dysphagia with risk for malnutrition and aspiration.  Oral mechanism exam was normal.  Trials of ice chips and thin liquids led to multiple sub-swallows per bolus (up to six), raising concerns for impaired transfer of materials through pharynx/UES.  Recommend proceeding with FEES this afternoon to assess anatomy/physiology of swallow. Pt and son agree with procedure.  D/W RN. SLP Visit Diagnosis: Dysphagia, unspecified (R13.10)    Aspiration Risk    tba   Diet Recommendation   Pending FEES  Medication Administration:  (crushed with liquid)    Other  Recommendations Oral Care Recommendations: Oral care BID    Recommendations for follow up therapy are one component of a multi-disciplinary discharge planning process, led by the attending physician.  Recommendations may be updated based on patient status, additional functional criteria and insurance authorization.  Follow up Recommendations Other (comment) (tba)                          Prognosis        Swallow Study   General Date of Onset: 02/14/21 HPI: 75 y/o male presented to ED on 11/10 after falling backwards and hitting head. CT C-spine showed traumatic recent fx in body of C5 vertebra and retrolisthesis at C5-6 level wutg advanced biforaminal impingement at same level. S/p posterior cervical decompression C4-C7 and fusion C3-T1 on 11/10.  PMH: Parkinson's disease, hypertension, hyperlipidemia, diabetes, gout, anxiety, COPD Type of Study: Bedside Swallow  Evaluation Previous Swallow Assessment: no Diet Prior to this Study: NPO Temperature Spikes Noted: No Respiratory Status: Nasal cannula History of Recent Intubation:  (for procedure only) Behavior/Cognition: Alert Oral Cavity Assessment: Within Functional Limits Oral Care Completed by SLP:  Recent completion by staff Oral Cavity - Dentition: Adequate natural dentition Self-Feeding Abilities: Total assist Patient Positioning: Upright in bed Baseline Vocal Quality: Low vocal intensity Volitional Cough: Weak Volitional Swallow: Able to elicit    Oral/Motor/Sensory Function Overall Oral Motor/Sensory Function: Within functional limits   Ice Chips Ice chips: Impaired Presentation: Spoon Pharyngeal Phase Impairments: Multiple swallows   Thin Liquid Thin Liquid: Impaired Presentation: Spoon;Straw Pharyngeal  Phase Impairments: Multiple swallows;Wet Vocal Quality    Nectar Thick Nectar Thick Liquid: Not tested   Honey Thick Honey Thick Liquid: Not tested   Puree Puree: Not tested   Solid     Solid: Not tested     Mitchell Hunter, [place] Office number [phone] Pager [phone]  Mitchell Hunter 02/15/2021,12:00 PM

## 2021-02-15 NOTE — Progress Notes (Signed)
Nurse aware that US guided IV for vasopressors needs an iwatch.

## 2021-02-15 NOTE — Procedures (Signed)
Objective Swallowing Evaluation: Type of Study: FEES-Fiberoptic Endoscopic Evaluation of Swallow   Patient Details  Name: Mitchell Hunter MRN: [id number] Date of Birth: Sep 26, 1945  Today's Date: 02/15/2021 Time: SLP Start Time (ACUTE ONLY): 1400 -SLP Stop Time (ACUTE ONLY): 0175  SLP Time Calculation (min) (ACUTE ONLY): 15 min   Past Medical History:  Past Medical History:  Diagnosis Date   Anxiety    Chronic renal insufficiency    COPD (chronic obstructive pulmonary disease) ([place])    Diabetes mellitus without complication ([place])    Gout    Hyperlipidemia    Hypertension    Parkinsonism ([place])    Pneumonia    hx   Past Surgical History:  Past Surgical History:  Procedure Laterality Date   APPENDECTOMY     ELBOW ARTHROSCOPY Right    torn cartilage   KNEE ARTHROSCOPY Left    cartilage   KNEE ARTHROSCOPY Right 07/22/2013   Procedure: ARTHROSCOPY KNEE;  Surgeon: Augustin Schooling, MD;  Location: [facility];  Service: Orthopedics;  Laterality: Right;   LUMBAR LAMINECTOMY/DECOMPRESSION MICRODISCECTOMY N/A 05/04/2013   Procedure: Right Lumbar Three-Four laminectomy and microdiskectomy ;  Surgeon: Hosie Spangle, MD;  Location: [facility];  Service: Neurosurgery;  Laterality: N/A;  Right Lumbar Three-Four laminectomy and microdiskectomy    POSTERIOR CERVICAL FUSION/FORAMINOTOMY N/A 02/14/2021   Procedure: POSTERIOR CERVICAL DECOMPRESSION CERVICAL FOUR-CERVICAL SEVEN, INSTRUMENTION AND FUSION CERVICAL THREE- THORACIC ONE;  Surgeon: Karsten Ro, DO;  Location: [facility];  Service: Neurosurgery;  Laterality: N/A;  POSTERIOR CERVICAL DECOMPRESSION CERVICAL FOUR-CERVICAL SEVEN, INSTRUMENTION AND FUSION CERVICAL THREE- THORACIC ONE   RENAL BIOPSY  01/2019   HPI: 75 y/o male presented to ED on 11/10 after falling backwards and hitting head. CT C-spine showed traumatic recent fx in body of C5 vertebra and retrolisthesis at C5-6 level wutg advanced biforaminal impingement at same level. S/p  posterior cervical decompression C4-C7 and fusion C3-T1 on 11/10.  PMH: Parkinson's disease, hypertension, hyperlipidemia, diabetes, gout, anxiety, COPD   Subjective: alert    Recommendations for follow up therapy are one component of a multi-disciplinary discharge planning process, led by the attending physician.  Recommendations may be updated based on patient status, additional functional criteria and insurance authorization.  Assessment / Plan / Recommendation  Clinical Impressions 02/15/2021  Clinical Impression Pt participated in FEES, demonstrating functional oropharyngeal swallowing.  Oral phase WNL.  Solid foods (purees/crackers) tended to reside in the vallecular space after the swallow, precipitating several sub-swallows in an effort to clear, which the pt. did successfully. Thin liquids did not penetrate the larynx nor were they aspirated. Structures of the larynx and hypopharynx were WNL; mobility of the vocal folds was normal. Results were D/W pt and his wife via phone.  Recommend resuming a regular diet/thin liquids.  Pt will need assistance with feeding; he may use straws. Give meds whole with puree. No further SLP intervention is needed - our service will sign off.  SLP Visit Diagnosis Dysphagia, unspecified (R13.10)  Attention and concentration deficit following --  Frontal lobe and executive function deficit following --  Impact on safety and function No limitations      Treatment Recommendations 02/15/2021  Treatment Recommendations No treatment recommended at this time     No flowsheet data found.  Diet Recommendations 02/15/2021  SLP Diet Recommendations Regular solids;Thin liquid  Liquid Administration via Straw  Medication Administration Whole meds with puree  Compensations Slow rate  Postural Changes --      Other Recommendations 02/15/2021  Recommended Consults --  Oral Care Recommendations Oral care BID  Other Recommendations --  Follow Up Recommendations  No SLP follow up  Assistance recommended at discharge Other (comment)  Functional Status Assessment --    No flowsheet data found.    Oral Phase 02/15/2021  Oral Phase WFL  Oral - Pudding Teaspoon --  Oral - Pudding Cup --  Oral - Honey Teaspoon --  Oral - Honey Cup --  Oral - Nectar Teaspoon --  Oral - Nectar Cup --  Oral - Nectar Straw --  Oral - Thin Teaspoon --  Oral - Thin Cup --  Oral - Thin Straw --  Oral - Puree --  Oral - Mech Soft --  Oral - Regular --  Oral - Multi-Consistency --  Oral - Pill --  Oral Phase - Comment --    Pharyngeal Phase 02/15/2021  Pharyngeal Phase Impaired  Pharyngeal- Pudding Teaspoon --  Pharyngeal --  Pharyngeal- Pudding Cup --  Pharyngeal --  Pharyngeal- Honey Teaspoon --  Pharyngeal --  Pharyngeal- Honey Cup --  Pharyngeal --  Pharyngeal- Nectar Teaspoon --  Pharyngeal --  Pharyngeal- Nectar Cup --  Pharyngeal --  Pharyngeal- Nectar Straw --  Pharyngeal --  Pharyngeal- Thin Teaspoon --  Pharyngeal --  Pharyngeal- Thin Cup --  Pharyngeal --  Pharyngeal- Thin Straw Delayed swallow initiation-vallecula  Pharyngeal --  Pharyngeal- Puree Delayed swallow initiation-vallecula;Pharyngeal residue - valleculae  Pharyngeal --  Pharyngeal- Mechanical Soft --  Pharyngeal --  Pharyngeal- Regular Delayed swallow initiation-vallecula;Pharyngeal residue - valleculae  Pharyngeal --  Pharyngeal- Multi-consistency --  Pharyngeal --  Pharyngeal- Pill --  Pharyngeal --  Pharyngeal Comment --     No flowsheet data found.   [name] 02/15/2021, 2:29 PM   [name], [place] Office number [phone] Pager [phone]

## 2021-02-15 NOTE — Evaluation (Addendum)
Physical Therapy Evaluation Patient Details Name: Mitchell Hunter MRN: [id number] DOB: [birth year] Today's Date: 02/15/2021  History of Present Illness  75 y/o male presented to ED on 11/10 after falling backwards and hitting head. CT C-spine showed traumatic recent fx in body of C5 vertebra and retrolisthesis at C5-6 level wutg advanced biforaminal impingement at same level. S/p posterior cervical decompression C4-C7 and fusion C3-T1 on 11/10.  PMH: Parkinson's disease, hypertension, hyperlipidemia, diabetes, gout, anxiety, COPD  Clinical Impression  Patient admitted with above diagnosis. Patient presents with quadriplegia, impaired balance, decreased activity tolerance, impaired sensation, impaired coordination, and impaired cognition. Patient disoriented to full situation and time. Patient with 0/5 strength in R LE and L hamstrings 2-/5 and quad/ankle PF 1/5. Placed patient in egress position due to lethargy and low BP. Requires 2L O2 [place] to maintain spO2 >90%. Patient will benefit from skilled PT services during acute stay to address listed deficits. Recommend SNF at discharge to maximize mobility and decreased caregiver burden.    *Patient would benefit from bilateral PRAFOs to prevent contractures.      Recommendations for follow up therapy are one component of a multi-disciplinary discharge planning process, led by the attending physician.  Recommendations may be updated based on patient status, additional functional criteria and insurance authorization.  Follow Up Recommendations Skilled nursing-short term rehab (<3 hours/day)    Assistance Recommended at Discharge Frequent or constant Supervision/Assistance  Functional Status Assessment Patient has had a recent decline in their functional status and/or demonstrates limited ability to make significant improvements in function in a reasonable and predictable amount of time  Equipment Recommendations  Other (comment);Hospital bed;Wheelchair  cushion (measurements PT);Wheelchair (measurements PT) (hoyer lift)    Recommendations for Other Services       Precautions / Restrictions Precautions Precautions: Fall;Cervical Precaution Booklet Issued: No Required Braces or Orthoses: Cervical Brace Cervical Brace: Hard collar (may remove while in bed)      Mobility  Bed Mobility               General bed mobility comments: placed patient in chair position in bed due to low BPs and lethargy    Transfers                   General transfer comment: defer at this time    Ambulation/Gait                  Stairs            Wheelchair Mobility    Modified Rankin (Stroke Patients Only)       Balance                                             Pertinent Vitals/Pain Pain Assessment: No/denies pain    Home Living Family/patient expects to be discharged to:: Private residence Living Arrangements: Spouse/significant other   Type of Home: House Home Access: Stairs to enter Entrance Stairs-Rails: Right;Left;Can reach both Technical brewer of Steps: 4   Home Layout: One level Home Equipment: Grab bars - tub/shower;Grab bars - toilet;Cane - single point;Rolling Barak Bialecki (2 wheels)      Prior Function Prior Level of Function : Independent/Modified Independent;History of Falls (last six months)             Mobility Comments: fall x 3 weeks ago per chart. Patient reports falling 2 years  ago       Hand Dominance   Dominant Hand: Right    Extremity/Trunk Assessment   Upper Extremity Assessment Upper Extremity Assessment: Defer to OT evaluation    Lower Extremity Assessment Lower Extremity Assessment: RLE deficits/detail;LLE deficits/detail RLE Deficits / Details: 0/5. Muscle spasms noted. No sensation in R LE RLE Sensation: decreased light touch;decreased proprioception RLE Coordination: decreased fine motor;decreased gross motor LLE Deficits / Details:  hamstrings 2-/5, quad 1/5, ankle PF 1/5. Able to wiggle toes. No sensation in LLE LLE Sensation: decreased light touch;decreased proprioception LLE Coordination: decreased fine motor;decreased gross motor    Cervical / Trunk Assessment Cervical / Trunk Assessment: Neck Surgery  Communication   Communication: Other (comment) (soft spoken)  Cognition Arousal/Alertness: Lethargic;Suspect due to medications Behavior During Therapy: [facility] for tasks assessed/performed Overall Cognitive Status: No family/caregiver present to determine baseline cognitive functioning Area of Impairment: Orientation;Attention;Following commands;Safety/judgement;Awareness                 Orientation Level: Disoriented to;Time;Situation Current Attention Level: Sustained   Following Commands: Follows one step commands with increased time Safety/Judgement: Decreased awareness of deficits Awareness: Intellectual   General Comments: seems confused but no family present to provide baseline. Following commands appropriately. Stated wife's name initally as Aldona Bar but states Apolonio Schneiders (which is correct) on second attempt. Unable to state name of hospital. Able to recall that he fell and injured his neck but does not recall surgery. Patient states "2019"        General Comments General comments (skin integrity, edema, etc.): placed mepiplex pads under cervical collar to prevent skin breakdown. On 2L O2 [place] initially, spO2 97%. Removed for session with patient spO2 89%. Donned 2L O2 [place] at end of session    Exercises     Assessment/Plan    PT Assessment Patient needs continued PT services  PT Problem List Decreased strength;Decreased range of motion;Decreased activity tolerance;Decreased balance;Decreased mobility;Decreased coordination;Decreased cognition;Decreased knowledge of use of DME;Decreased safety awareness;Decreased knowledge of precautions;Impaired sensation       PT Treatment Interventions DME  instruction;Functional mobility training;Therapeutic activities;Therapeutic exercise;Balance training;Neuromuscular re-education;Patient/family education;Wheelchair mobility training;Manual techniques    PT Goals (Current goals can be found in the Care Plan section)  Acute Rehab PT Goals Patient Stated Goal: did not state PT Goal Formulation: With patient Time For Goal Achievement: 03/01/21 Potential to Achieve Goals: Fair    Frequency Min 3X/week   Barriers to discharge        Co-evaluation PT/OT/SLP Co-Evaluation/Treatment: Yes Reason for Co-Treatment: For patient/therapist safety;To address functional/ADL transfers;Complexity of the patient's impairments (multi-system involvement) PT goals addressed during session: Mobility/safety with mobility;Strengthening/ROM         AM-PAC PT "6 Clicks" Mobility  Outcome Measure Help needed turning from your back to your side while in a flat bed without using bedrails?: Total Help needed moving from lying on your back to sitting on the side of a flat bed without using bedrails?: Total Help needed moving to and from a bed to a chair (including a wheelchair)?: Total Help needed standing up from a chair using your arms (e.g., wheelchair or bedside chair)?: Total Help needed to walk in hospital room?: Total Help needed climbing 3-5 steps with a railing? : Total 6 Click Score: 6    End of Session Equipment Utilized During Treatment: Cervical collar;Oxygen Activity Tolerance: Patient limited by lethargy Patient left: in bed;with call bell/phone within reach Nurse Communication: Mobility status PT Visit Diagnosis: Muscle weakness (generalized) (M62.81);Difficulty in walking, not elsewhere  classified (R26.2);Other symptoms and signs involving the nervous system (R29.898);History of falling (Z91.81)    Time: 4259-5638 PT Time Calculation (min) (ACUTE ONLY): 26 min   Charges:   PT Evaluation $PT Eval Moderate Complexity: 1 Mod PT  Treatments $Therapeutic Activity: 8-22 mins        Pape Parson A. Gilford Rile PT, DPT Acute Rehabilitation Services Pager [phone] Office [phone]   Linna Hoff 02/15/2021, 10:37 AM

## 2021-02-15 NOTE — Progress Notes (Signed)
Low urine uotput, soft BPs discussed with Medical team. Orders received.

## 2021-02-16 ENCOUNTER — Inpatient Hospital Stay (HOSPITAL_COMMUNITY): Payer: Medicare Other

## 2021-02-16 DIAGNOSIS — I9581 Postprocedural hypotension: Secondary | ICD-10-CM | POA: Diagnosis not present

## 2021-02-16 LAB — BASIC METABOLIC PANEL
Anion gap: 8 (ref 5–15)
Anion gap: 7 (ref 5–15)
BUN: 45 mg/dL — ABNORMAL HIGH (ref 8–23)
BUN: 45 mg/dL — ABNORMAL HIGH (ref 8–23)
CO2: 17 mmol/L — ABNORMAL LOW (ref 22–32)
CO2: 15 mmol/L — ABNORMAL LOW (ref 22–32)
Calcium: 6.8 mg/dL — ABNORMAL LOW (ref 8.9–10.3)
Calcium: 7 mg/dL — ABNORMAL LOW (ref 8.9–10.3)
Chloride: 109 mmol/L (ref 98–111)
Chloride: 110 mmol/L (ref 98–111)
Creatinine, Ser: 3.6 mg/dL — ABNORMAL HIGH (ref 0.61–1.24)
Creatinine, Ser: 3.68 mg/dL — ABNORMAL HIGH (ref 0.61–1.24)
GFR, Estimated: 17 mL/min — ABNORMAL LOW (ref >60)
GFR, Estimated: 16 mL/min — ABNORMAL LOW (ref >60)
Glucose, Bld: 99 mg/dL (ref 70–99)
Glucose, Bld: 97 mg/dL (ref 70–99)
Potassium: 4.9 mmol/L (ref 3.5–5.1)
Potassium: 5.1 mmol/L (ref 3.5–5.1)
Sodium: 134 mmol/L — ABNORMAL LOW (ref 135–145)
Sodium: 132 mmol/L — ABNORMAL LOW (ref 135–145)

## 2021-02-16 LAB — CBC
HCT: 26.1 % — ABNORMAL LOW (ref 39.0–52.0)
Hemoglobin: 8.1 g/dL — ABNORMAL LOW (ref 13.0–17.0)
MCH: 32.4 pg (ref 26.0–34.0)
MCHC: 31 g/dL (ref 30.0–36.0)
MCV: 104.4 fL — ABNORMAL HIGH (ref 80.0–100.0)
Platelets: 187 10*3/uL (ref 150–400)
RBC: 2.5 MIL/uL — ABNORMAL LOW (ref 4.22–5.81)
RDW: 15.2 % (ref 11.5–15.5)
WBC: 10.5 10*3/uL (ref 4.0–10.5)
nRBC: 0 % (ref 0.0–0.2)

## 2021-02-16 LAB — GLUCOSE, CAPILLARY: Glucose-Capillary: 103 mg/dL — ABNORMAL HIGH (ref 70–99)

## 2021-02-16 MED ORDER — SODIUM CHLORIDE 0.9 % IV BOLUS
500.0000 mL | Freq: Once | INTRAVENOUS | Status: AC
  Administered 2021-02-16: 500 mL via INTRAVENOUS

## 2021-02-16 NOTE — Consult Note (Signed)
NAME:  DUDLEY MAGES, MRN:  [id number], DOB:  November 08, 1945, LOS: 2 ADMISSION DATE:  02/14/2021, CONSULTATION DATE:  02/15/2021 REFERRING MD:  Dr. Reatha Armour, CHIEF COMPLAINT:  Fall   History of Present Illness:   75 year old with prior history of COPD, HTN, HLD, DM, parkinson's disease, and CKD who presented to [facility] after a fall.   Reports he fell several weeks ago but did not seek treatment.  He fell on the morning of 10/11, backwards hitting his head after he lost his balance.  No loss of consciousness, but following fall, he was noted to have weakness and altered sensation in his upper and lower extremities, as well as neck pain, and some dysarthria.  He was maintained in cervical collar.  He presented as a code stroke with EMS.   He was found on stroke workup to have acute fracture of C5 anterior vertebral body fracture with retrolisthesis and severe cervical stenosis.  NSGY was emergently consulted, who admitted patient to Neuro ICU.  He underwent MRI which confirmed severe stenosis C5-6 with cord compression and moderate to severe stenosis at C6-7, with cord signal change.  He was taken for emergent posterior cervical decompression and laminectomy C4-7 with posterior instrumentation C3-T1 that evening by Dr. Reatha Armour.  Today, he has had lower pressure with MAP in the 50-60s, with ideal goal of 70-75 per neurosurgery despite fluid bolus.  Also noted to have worsening renal function.   PCCM consulted to assist with medical management.    Pertinent  Medical History  COPD, HTN, HLD, DM, parkinson's, anxiety, gout, CKD 3B, chronic low back and neck pain  Significant Hospital Events: Including procedures, antibiotic start and stop dates in addition to other pertinent events   11/10 admitted s/p fall with cervical fracture. S/p cervical decompression and laminectomy 11/11 PCCM consulted for hypotension and AKI  Interim History / Subjective:  Started on peripheral pressors yesterday. Remains on low dose  neo. Elevated K 5.7. Given lokelma by eLink. No BM/flatus Objective   Blood pressure (!) 114/52, pulse 66, temperature 98 F (36.7 C), temperature source Oral, resp. rate 14, height 6' (1.829 m), weight 82.6 kg, SpO2 97 %.        Intake/Output Summary (Last 24 hours) at 02/16/2021 0713 Last data filed at 02/16/2021 0700 Gross per 24 hour  Intake 2238.28 ml  Output 560 ml  Net 1678.28 ml    Filed Weights   02/14/21 0900 02/14/21 0952  Weight: 83 kg 82.6 kg   Physical Exam: General: Well-appearing, no acute distress HENT: [place], AT, OP clear, MMM Eyes: PERRL, EOMI, no scleral icterus Respiratory: Clear to auscultation bilaterally.  No crackles, wheezing or rales Cardiovascular: RRR, -M/R/G, no JVD GI: BS+, soft, nontender Extremities:-Edema,-tenderness Neuro: AAO x4, CNII-XII grossly intact Skin: Intact, no rashes or bruising Psych: Normal mood, normal affect GU: Foley in place   K 5.7 improved 4.9 BUN/Cr unchanged 45/3.6 Hg 8.1  Resolved Hospital Problem list     Assessment & Plan:   C5 fracture with severe cervical spinal stenosis and spinal cord injury s/p c-decompression/laminectomy - post op day 2, posterior cervical decompression, laminectomy C4-7 with posterior instrumentation C3-T1 with quadriplegia - passed SLP- FEES today - HR remains stable in the 60's P:  - Post-op care per NSGY - ongoing c-collar - PT/ OT - ongoing multimodal pain control with good bowel regimen  Post-op hypotension -  s/p NS 1L - Continue MIVF NS at 19ml/hr - Continue peripheral Neo for MAP goal 70-75  for spinal perfusion - No indication for central line at this point  AKI on CKD stage 3 Mild hyperkalemia without EKG changes - likely due to hypoperfusion - ongoing gentle hydration as above - trend BMET - continue foley - trend renal indices/ strict I/Os - order renal ultrasound  Hx HTN - holding antihypertensives for MAP goal 70-75  Hx COPD Current tobacco abuse On O2  however sats >96% - Wean supplemental O2 for goal 88-92% - has rescue inhaler he uses only - will add prn albuterol as needed  - encourage IS frequently - tobacco cessation when appropriate   Parkinson's Disease - cont sinemet - PRN Xanan for tremors  Best Practice (right click and "Reselect all SmartList Selections" daily)   Diet/type: Regular consistency (see orders)- per SLP recs DVT prophylaxis: prophylactic heparin  GI prophylaxis: PPI Lines: N/A Foley:  Yes, and it is still needed Code Status:  full code Last date of multidisciplinary goals of care discussion [per primary ]  Labs   CBC: Recent Labs  Lab 02/14/21 0857 02/14/21 0901 02/15/21 0500 02/16/21 0514  WBC 6.9  --  11.5* 10.5  NEUTROABS 4.6  --   --   --   HGB 11.1* 11.2* 9.7* 8.1*  HCT 33.0* 33.0* 29.4* 26.1*  MCV 98.8  --  99.7 104.4*  PLT 201  --  207 187     Basic Metabolic Panel: Recent Labs  Lab 02/14/21 0857 02/14/21 0901 02/15/21 0500 02/16/21 0514  NA 135 135 135 134*  K 5.7* 5.5* 5.7* 4.9  CL 107 107 107 109  CO2 22  --  18* 17*  GLUCOSE 114* 107* 171* 99  BUN 36* 36* 42* 45*  CREATININE 3.21* 3.70* 3.16* 3.60*  CALCIUM 8.5*  --  7.2* 6.8*    GFR: Estimated Creatinine Clearance: 19.5 mL/min (A) (by C-G formula based on SCr of 3.6 mg/dL (H)). Recent Labs  Lab 02/14/21 0857 02/15/21 0500 02/16/21 0514  WBC 6.9 11.5* 10.5     Liver Function Tests: Recent Labs  Lab 02/14/21 0857  AST 15  ALT <5  ALKPHOS 79  BILITOT 0.7  PROT 5.3*  ALBUMIN 2.4*    No results for input(s): LIPASE, AMYLASE in the last 168 hours. No results for input(s): AMMONIA in the last 168 hours.  ABG    Component Value Date/Time   TCO2 22 02/14/2021 0901      Coagulation Profile: Recent Labs  Lab 02/14/21 0857  INR 1.0     Cardiac Enzymes: No results for input(s): CKTOTAL, CKMB, CKMBINDEX, TROPONINI in the last 168 hours.  HbA1C: No results found for: HGBA1C  CBG: Recent Labs   Lab 02/14/21 0857 02/14/21 1234 02/14/21 1736  GLUCAP 108* 120* 176*     Critical care time: 45 mins    The patient is critically ill with multiple organ systems failure and requires high complexity decision making for assessment and support, frequent evaluation and titration of therapies, application of advanced monitoring technologies and extensive interpretation of multiple databases.    Rodman Pickle, M.D. [facility] Pulmonary/Critical Care Medicine 02/16/2021 7:13 AM   Please see Amion for pager number to reach on-call Pulmonary and Critical Care Team.

## 2021-02-16 NOTE — Progress Notes (Signed)
Patient ID: Mitchell Hunter, male   DOB: 1945/11/03, 75 y.o.   MRN: [id number] BP (!) 109/52   Pulse 64   Temp 98.3 F (36.8 C) (Axillary)   Resp 16   Ht 6' (1.829 m)   Wt 82.6 kg   SpO2 92%   BMI 24.68 kg/m  Alert, limited movement.  No change from yesterday Hands quite weak, unable to make a fist Wound is clean, dry Will remove drain tomorrow

## 2021-02-17 ENCOUNTER — Inpatient Hospital Stay (HOSPITAL_COMMUNITY): Payer: Medicare Other

## 2021-02-17 DIAGNOSIS — N179 Acute kidney failure, unspecified: Secondary | ICD-10-CM

## 2021-02-17 DIAGNOSIS — I9581 Postprocedural hypotension: Secondary | ICD-10-CM | POA: Diagnosis not present

## 2021-02-17 LAB — BASIC METABOLIC PANEL
Anion gap: 8 (ref 5–15)
BUN: 50 mg/dL — ABNORMAL HIGH (ref 8–23)
CO2: 14 mmol/L — ABNORMAL LOW (ref 22–32)
Calcium: 7.4 mg/dL — ABNORMAL LOW (ref 8.9–10.3)
Chloride: 113 mmol/L — ABNORMAL HIGH (ref 98–111)
Creatinine, Ser: 3.87 mg/dL — ABNORMAL HIGH (ref 0.61–1.24)
GFR, Estimated: 15 mL/min — ABNORMAL LOW (ref >60)
Glucose, Bld: 97 mg/dL (ref 70–99)
Potassium: 5.2 mmol/L — ABNORMAL HIGH (ref 3.5–5.1)
Sodium: 135 mmol/L (ref 135–145)

## 2021-02-17 LAB — POCT I-STAT 7, (LYTES, BLD GAS, ICA,H+H)
Acid-base deficit: 10 mmol/L — ABNORMAL HIGH (ref 0.0–2.0)
Acid-base deficit: 13 mmol/L — ABNORMAL HIGH (ref 0.0–2.0)
Bicarbonate: 13.5 mmol/L — ABNORMAL LOW (ref 20.0–28.0)
Bicarbonate: 16.3 mmol/L — ABNORMAL LOW (ref 20.0–28.0)
Calcium, Ion: 1.11 mmol/L — ABNORMAL LOW (ref 1.15–1.40)
Calcium, Ion: 1.12 mmol/L — ABNORMAL LOW (ref 1.15–1.40)
HCT: 25 % — ABNORMAL LOW (ref 39.0–52.0)
HCT: 40 % (ref 39.0–52.0)
Hemoglobin: 13.6 g/dL (ref 13.0–17.0)
Hemoglobin: 8.5 g/dL — ABNORMAL LOW (ref 13.0–17.0)
O2 Saturation: 88 %
O2 Saturation: 97 %
Patient temperature: 98.8
Patient temperature: 99.4
Potassium: 4.4 mmol/L (ref 3.5–5.1)
Potassium: 5 mmol/L (ref 3.5–5.1)
Sodium: 135 mmol/L (ref 135–145)
Sodium: 135 mmol/L (ref 135–145)
TCO2: 15 mmol/L — ABNORMAL LOW (ref 22–32)
TCO2: 17 mmol/L — ABNORMAL LOW (ref 22–32)
pCO2 arterial: 34.9 mmHg (ref 32.0–48.0)
pCO2 arterial: 36.5 mmHg (ref 32.0–48.0)
pH, Arterial: 7.198 — CL (ref 7.350–7.450)
pH, Arterial: 7.259 — ABNORMAL LOW (ref 7.350–7.450)
pO2, Arterial: 110 mmHg — ABNORMAL HIGH (ref 83.0–108.0)
pO2, Arterial: 67 mmHg — ABNORMAL LOW (ref 83.0–108.0)

## 2021-02-17 LAB — GLUCOSE, CAPILLARY
Glucose-Capillary: 82 mg/dL (ref 70–99)
Glucose-Capillary: 88 mg/dL (ref 70–99)
Glucose-Capillary: 90 mg/dL (ref 70–99)
Glucose-Capillary: 92 mg/dL (ref 70–99)
Glucose-Capillary: 97 mg/dL (ref 70–99)

## 2021-02-17 LAB — CBC
HCT: 27.7 % — ABNORMAL LOW (ref 39.0–52.0)
Hemoglobin: 8.8 g/dL — ABNORMAL LOW (ref 13.0–17.0)
MCH: 33.1 pg (ref 26.0–34.0)
MCHC: 31.8 g/dL (ref 30.0–36.0)
MCV: 104.1 fL — ABNORMAL HIGH (ref 80.0–100.0)
Platelets: 192 10*3/uL (ref 150–400)
RBC: 2.66 MIL/uL — ABNORMAL LOW (ref 4.22–5.81)
RDW: 15.3 % (ref 11.5–15.5)
WBC: 10.5 10*3/uL (ref 4.0–10.5)
nRBC: 0 % (ref 0.0–0.2)

## 2021-02-17 LAB — URINALYSIS, ROUTINE W REFLEX MICROSCOPIC
Bacteria, UA: NONE SEEN
Bilirubin Urine: NEGATIVE
Glucose, UA: NEGATIVE mg/dL
Ketones, ur: 5 mg/dL — AB
Leukocytes,Ua: NEGATIVE
Nitrite: NEGATIVE
Protein, ur: 100 mg/dL — AB
Specific Gravity, Urine: 1.017 (ref 1.005–1.030)
pH: 5 (ref 5.0–8.0)

## 2021-02-17 MED ORDER — FUROSEMIDE 10 MG/ML IJ SOLN
80.0000 mg | Freq: Once | INTRAMUSCULAR | Status: AC
  Administered 2021-02-17: 80 mg via INTRAVENOUS
  Filled 2021-02-17: qty 8

## 2021-02-17 MED ORDER — SODIUM BICARBONATE 8.4 % IV SOLN: 50.0000 meq | Freq: Once | INTRAVENOUS | Status: AC

## 2021-02-17 MED ORDER — SODIUM BICARBONATE 8.4 % IV SOLN
INTRAVENOUS | Status: AC
  Administered 2021-02-17: 50 meq via INTRAVENOUS
  Filled 2021-02-17: qty 50

## 2021-02-17 MED ORDER — GABAPENTIN 600 MG PO TABS
300.0000 mg | ORAL_TABLET | Freq: Two times a day (BID) | ORAL | Status: DC
  Administered 2021-02-17 – 2021-02-18 (×4): 300 mg via ORAL
  Filled 2021-02-17 (×8): qty 0.5

## 2021-02-17 MED ORDER — LACTATED RINGERS IV BOLUS
500.0000 mL | Freq: Once | INTRAVENOUS | Status: AC
  Administered 2021-02-17: 500 mL via INTRAVENOUS

## 2021-02-17 MED ORDER — ESCITALOPRAM OXALATE 10 MG PO TABS
20.0000 mg | ORAL_TABLET | Freq: Every day | ORAL | Status: DC
  Administered 2021-02-18: 20 mg via ORAL
  Filled 2021-02-17 (×2): qty 2

## 2021-02-17 MED ORDER — SODIUM ZIRCONIUM CYCLOSILICATE 5 G PO PACK
5.0000 g | PACK | Freq: Once | ORAL | Status: AC
  Administered 2021-02-17: 5 g via ORAL
  Filled 2021-02-17: qty 1

## 2021-02-17 MED ORDER — STERILE WATER FOR INJECTION IV SOLN
INTRAVENOUS | Status: AC
  Filled 2021-02-17 (×5): qty 1000

## 2021-02-17 NOTE — Progress Notes (Signed)
02/17/2021  Met with family as patient was more somnolent.  Had received some narcotics so could be some retention.  RN to check ABG from [facility]. Family okay with BIPAP PRN.  Family wanted to let me know that patient is a DNR in the event of further respiratory decline.  I encouraged everyone to have a GOC discussion with Dr. Reatha Armour in AM as it seems patient's overall condition and quality of life have been deteriorating over past month.    Family has stated patient would not have wanted HD.  Not sure what the timeline on this type of spinal injury recovery would be and if the length and probability of functional recovery aligns with patient's previously stated wishes.  15 min advance care planning

## 2021-02-17 NOTE — Progress Notes (Signed)
Pt placed on bipap by RT. Pt tolerating well at this time, RN aware, RT will continue to monitor.

## 2021-02-17 NOTE — Consult Note (Signed)
WOC Nurse Consult Note: Reason for Consult: Area of deep tissue pressure injury (DTPI) noted to sacrum during bath. Purple/maroon discoloration, non blanching. Wound type: Pressure Pressure Injury POA: No Measurement:7cm x 7cm Wound bed:N/A Drainage (amount, consistency, odor) N/A Periwound:intact Dressing procedure/placement/frequency: Patient is on a mattress replacement with low air loss feature, is being turned and repositioned per house protocol. Topical care will be to place a size-appropriate piece of antimicrobial nonadherent (xeroform gauze, Lawson # [id number]) to the area, then cover with gauze and top with a silicone foam for the sacrum. Continue to turn and reposition, minimize time in the supine position.After consulting with the patient's Bedside RN, J. Lam, we together decide to add bilateral pressure redistribution heel boots as another protective intervention.   [facility] nursing team will follow, seeing every 7-10 days, and will remain available to this patient, the nursing and medical teams.   Thanks, Maudie Flakes, MSN, RN, [place], Arther Abbott  Pager# [phone]

## 2021-02-17 NOTE — Progress Notes (Signed)
VO from Dr. Christella Noa to remove c-collar.

## 2021-02-17 NOTE — Consult Note (Addendum)
NAME:  LAVONNE CASS, MRN:  [id number], DOB:  Mar 16, 1946, LOS: 3 ADMISSION DATE:  02/14/2021, CONSULTATION DATE:  02/15/2021 REFERRING MD:  Dr. Reatha Armour, CHIEF COMPLAINT:  Fall   History of Present Illness:   75 year old with prior history of COPD, HTN, HLD, DM, parkinson's disease, and CKD who presented to [facility] after a fall.   Reports he fell several weeks ago but did not seek treatment.  He fell on the morning of 10/11, backwards hitting his head after he lost his balance.  No loss of consciousness, but following fall, he was noted to have weakness and altered sensation in his upper and lower extremities, as well as neck pain, and some dysarthria.  He was maintained in cervical collar.  He presented as a code stroke with EMS.   He was found on stroke workup to have acute fracture of C5 anterior vertebral body fracture with retrolisthesis and severe cervical stenosis.  NSGY was emergently consulted, who admitted patient to Neuro ICU.  He underwent MRI which confirmed severe stenosis C5-6 with cord compression and moderate to severe stenosis at C6-7, with cord signal change.  He was taken for emergent posterior cervical decompression and laminectomy C4-7 with posterior instrumentation C3-T1 that evening by Dr. Reatha Armour.  Today, he has had lower pressure with MAP in the 50-60s, with ideal goal of 70-75 per neurosurgery despite fluid bolus.  Also noted to have worsening renal function.   PCCM consulted to assist with medical management.    Pertinent  Medical History  COPD, HTN, HLD, DM, parkinson's, anxiety, gout, CKD 3B, chronic low back and neck pain  Significant Hospital Events: Including procedures, antibiotic start and stop dates in addition to other pertinent events   11/10 admitted s/p fall with cervical fracture. S/p cervical decompression and laminectomy 11/11 PCCM consulted for hypotension and AKI 11/13 Weaned off vasopressors  Interim History / Subjective:  Weaned off neo. UOP  520cc  Objective   Blood pressure (!) 108/51, pulse 69, temperature 97.9 F (36.6 C), temperature source Oral, resp. rate 11, height 6' (1.829 m), weight 82.6 kg, SpO2 94 %.        Intake/Output Summary (Last 24 hours) at 02/17/2021 7106 Last data filed at 02/17/2021 0600 Gross per 24 hour  Intake 726.37 ml  Output 590 ml  Net 136.37 ml   Filed Weights   02/14/21 0900 02/14/21 0952  Weight: 83 kg 82.6 kg   Physical Exam: General: Well-appearing, no acute distress HENT: [place], AT, OP clear, MMM, C-collar Eyes: EOMI, no scleral icterus Respiratory: Clear to auscultation bilaterally.  No crackles, wheezing or rales Cardiovascular: RRR, -M/R/G, no JVD GI: BS+, soft, nontender Extremities:-Edema,-tenderness Neuro: AAO x4, CNII-XII grossly intact, weakness x 4, flicker in LUE, moves feet bilaterally Skin: Intact, no rashes or bruising Psych: Normal mood, normal affect  Hyperkalemia resolved BUN/Cr slightly worsened 45/3.6 Stable Hg 8  Resolved Hospital Problem list     Assessment & Plan:   C5 fracture with severe cervical spinal stenosis and spinal cord injury s/p c-decompression/laminectomy - post op day 2, posterior cervical decompression, laminectomy C4-7 with posterior instrumentation C3-T1 with quadriplegia - passed SLP- FEES today - HR remains stable in the 60's P:  - Post-op care per NSGY - ongoing c-collar - PT/ OT - ongoing multimodal pain control with good bowel regimen  Post-op hypotension - resolved -  s/p IVF resuscitation - Continue MIVF NS at 161ml/hr - Off peripheral Neo. MAP goal 70-75 for spinal perfusion  AKI  on CKD stage 3 - may be slow recovery. Continue to monitor UOP Mild hyperkalemia without EKG changes - slightly worsening - likely due to hypoperfusion. Renal US unremarkable - repeat IVF bolus - [place] - Consult Nephrology - trend BMET - continue foley - trend renal indices/ strict I/Os  Hx HTN - holding antihypertensives for MAP goal  70-75  Hx COPD Current tobacco abuse On O2 however sats >96% - Wean supplemental O2 for goal 88-92% - has rescue inhaler he uses only - will add prn albuterol as needed  - encourage IS frequently - tobacco cessation when appropriate   Parkinson's Disease - cont sinemet - PRN Xanan for tremors  Best Practice (right click and "Reselect all SmartList Selections" daily)   Diet/type: Regular consistency (see orders)- per SLP recs DVT prophylaxis: prophylactic heparin  GI prophylaxis: PPI Lines: N/A Foley:  Yes, and it is still needed Code Status:  full code Last date of multidisciplinary goals of care discussion [per primary ]  Critical care time:     The patient is critically ill with multiple organ systems failure and requires high complexity decision making for assessment and support, frequent evaluation and titration of therapies, application of advanced monitoring technologies and extensive interpretation of multiple databases.  Independent Critical Care Time: 32 Minutes.   Rodman Pickle, M.D. [facility] Pulmonary/Critical Care Medicine 02/17/2021 7:57 AM   Please see Amion for pager number to reach on-call Pulmonary and Critical Care Team.

## 2021-02-17 NOTE — Progress Notes (Signed)
[place] [facility] Progress Note Patient Name: Mitchell Hunter DOB: 07/02/1945 MRN: [id number]   Date of Service  02/17/2021  HPI/Events of Note  Received request for ABG as last ABG 7.19/35/67 placed on bicarb drip and BiPap  Now seen off BiPap  eICU Interventions  ABG ordered     Intervention Category Major Interventions: Acid-Base disturbance - evaluation and management  Judd Lien 02/17/2021, 10:32 PM

## 2021-02-17 NOTE — Consult Note (Signed)
Renal Service Consult Note [facility]  Mitchell Hunter 02/17/2021 Sol Blazing, MD Requesting Physician: Dr. Loanne Drilling  Reason for Consult: Renal failure HPI: The patient is a 75 y.o. year-old w/ hx of COPD, DM2, CKD, HL, HTN, parkinsons admitted on 11/10 for walking and balance difficulties 3 wks after a fall. Pt was admitted for severe spinal stenosis w/ traumatic changes and C5 spinal cord injury. Went to OR per NSurg the same day for decompression and stabilization. Creat on admit was 3.7, which improved to 3.1 then worsening up to 3.6 yesterday and 3.87 today. Asked to see for renal failure.   Pt seen in room, pt's daughter at bedside.  Pt is lethargic and doesn't provide much history. Pt's daughter states she doesn't know about his renal history. She states the patient told her pre-op that he wanted to be DNR.  She suspects also that the patient wouldn't be interested in dialysis.     Pt getting IV dilaudid 0.61m q 3h, last dose 3 hrs ago. Yesterday got norco 2 times, no IV dilaudid.   ROS - not available, pt altered   Past Medical History  Past Medical History:  Diagnosis Date   Anxiety    Chronic renal insufficiency    COPD (chronic obstructive pulmonary disease) (HCC)    Diabetes mellitus without complication (HCC)    Gout    Hyperlipidemia    Hypertension    [place]insonism ([place]    Pneumonia    hx   Past Surgical History  Past Surgical History:  Procedure Laterality Date   APPENDECTOMY     ELBOW ARTHROSCOPY Right    torn cartilage   KNEE ARTHROSCOPY Left    cartilage   KNEE ARTHROSCOPY Right 07/22/2013   Procedure: ARTHROSCOPY KNEE;  Surgeon: SAugustin Schooling MD;  [place] [facility]  Service: Orthopedics;  Laterality: Right;   LUMBAR LAMINECTOMY/DECOMPRESSION MICRODISCECTOMY N/A 05/04/2013   Procedure: Right Lumbar Three-Four laminectomy and microdiskectomy ;  Surgeon: RHosie Spangle MD;  [place] [place];  Service: Neurosurgery;   Laterality: N/A;  Right Lumbar Three-Four laminectomy and microdiskectomy    POSTERIOR CERVICAL FUSION/FORAMINOTOMY N/A 02/14/2021   Procedure: POSTERIOR CERVICAL DECOMPRESSION CERVICAL FOUR-CERVICAL SEVEN, INSTRUMENTION AND FUSION CERVICAL THREE- THORACIC ONE;  Surgeon: DKarsten Ro DO;  [place]  Service: Neurosurgery;  Laterality: N/A;  POSTERIOR CERVICAL DECOMPRESSION CERVICAL FOUR-CERVICAL SEVEN, INSTRUMENTION AND FUSION CERVICAL THREE- THORACIC ONE   RENAL BIOPSY  01/2019   Family History  Family History  Problem Relation Age of Onset   Dementia Mother        Alzheimer's   Glaucoma Mother    Lung disease Father    Alcohol abuse Father    Parkinsonism Brother    Social History  reports that he has been smoking cigarettes. He has a 75.00 pack-year smoking history. He quit smokeless tobacco use about 33 years ago. He reports current alcohol use. He reports that he does not use drugs. Allergies  Allergies  Allergen Reactions   Adhesive [Tape] Other (See Comments)    SKIN IS VERY THIN AND WILL TEAR EASILY- WILL NEED EITHER A TAPE ALTERNATIVE OR PAPER TAPE   Home medications Prior to Admission medications   Medication Sig Start Date End Date Taking? Authorizing Provider  acetaminophen (TYLENOL) 650 MG CR tablet Take 650-1,300 mg by mouth 3 (three) times daily as needed for pain.   Yes [provider]  allopurinol (ZYLOPRIM) 100 MG tablet Take 100 mg by mouth 2 (  two) times daily. 04/19/13  Yes [provider]  ALPRAZolam (XANAX) 0.25 MG tablet Take 0.25-0.5 mg by mouth 2 (two) times daily as needed for sleep (or tremors).   Yes [provider]  amLODipine (NORVASC) 10 MG tablet Take 1 tablet (10 mg total) by mouth daily. 07/03/18  Yes Shelly Coss, MD  aspirin EC 81 MG tablet Take 81 mg by mouth every other day.   Yes [provider]  benazepril (LOTENSIN) 20 MG tablet Take 20 mg by mouth daily.   Yes [provider]   carbidopa-levodopa (SINEMET IR) 25-100 MG tablet Take 2 tablets by mouth 3 (three) times daily before meals. Patient taking differently: Take 3 tablets by mouth See admin instructions. Take 3 tablets by mouth in the morning and evening 07/24/20  Yes Penumalli, Earlean Polka, MD  cetirizine (ZYRTEC) 10 MG tablet Take 10 mg by mouth daily.   Yes [provider]  Cholecalciferol (VITAMIN D3) 1.25 MG (50000 UT) CAPS Take 50,000 Units by mouth every Sunday.   Yes [provider]  escitalopram (LEXAPRO) 10 MG tablet Take 10 mg by mouth daily. 06/07/19  Yes [provider]  folic acid (FOLVITE) 1 MG tablet Take 1 tablet (1 mg total) by mouth daily. 07/03/18  Yes Shelly Coss, MD  gabapentin (NEURONTIN) 600 MG tablet Take 600 mg by mouth in the morning and at bedtime.   Yes [provider]  Multiple Vitamin (MULTIVITAMIN) capsule Take 1 capsule by mouth daily.   Yes [provider]  Multiple Vitamins-Minerals (PRESERVISION AREDS 2 PO) Take 1 capsule by mouth 2 (two) times daily.   Yes [provider]  Omega-3 Fatty Acids (FISH OIL) 1000 MG CAPS Take 1,000 mg by mouth 2 (two) times daily.    Yes [provider]  omeprazole (PRILOSEC) 20 MG capsule Take 20 mg by mouth daily before breakfast.   Yes [provider]  promethazine (PHENERGAN) 12.5 MG tablet Take 12.5 mg by mouth every 8 (eight) hours as needed for nausea or vomiting.  01/27/18  Yes [provider]  thiamine 100 MG tablet Take 1 tablet (100 mg total) by mouth daily. 07/03/18  Yes Shelly Coss, MD  tiZANidine (ZANAFLEX) 4 MG capsule Take 4 mg by mouth 3 (three) times daily as needed for muscle spasms. 01/20/19  Yes [provider]  triamcinolone cream (KENALOG) 0.1 % Apply 1 application topically as needed (for rashes or irritation).  09/21/17  Yes [provider]  dapagliflozin propanediol (FARXIGA) 10 MG TABS tablet Take 1 tablet by mouth daily. Patient  not taking: No sig reported 08/18/19   [provider]  HYDROcodone-acetaminophen (NORCO) 10-325 MG tablet Take 1 tablet by mouth daily as needed (for pain). 09/20/20   [provider]     Vitals:   02/17/21 0700 02/17/21 0800 02/17/21 0900 02/17/21 1000  BP: 119/63 (!) 130/58 (!) 134/53 (!) 120/57  Pulse: 74 75 80 78  Resp: 19 (!) 23 (!) 22 18  Temp:  98 F (36.7 C)    TempSrc:  Oral    SpO2: 94% 94% 95% 92%  Weight:      Height:       Exam Gen lethargic, not responding to voice/ touch No rash, cyanosis or gangrene Sclera anicteric, throat not seen No jvd or bruits Chest not cooperative, no obvious rales/ wheezing, no ^wob RRR no MRG Abd soft ntnd no mass or ascites +bs GU normal MS no joint effusions or deformity Ext  2+ RUE edema and 1-2+ bilat hip edema, 1+pretib edema Neuro poorly responsive, got dilaudid 3 hrs ago, low dose         Home meds include - zyloprim, xanax , norvasc, asa, benazepril 20 qd, sinemet, lexapro, neurontin, prilosec, zanaflex prn, dapagliflozin, norco prn, prns/ vits/ supps   Renal US > 9-10 cm x 2 w/o hydro, normal echo   UA pend    UNa, UCr pend    I/O since admit = 6.4 L in and 3.3 L out = + 3 L    UOP about 500- 700 cc per day here last 2 days    Required neo gtt for about 24 hrs 11/11- 12    Current BP's 108- 130 / 58- 77 , HR 70-80 RR 15  afeb on RA          Date  Creat  eGFR     2015  1.23 June 2018  2.37 >> 1.52 AKI episode, 26- 45 ml/min     Oct 2020  1.83  36, IIIb      02/14/21  3.21  19      11/11  3.16       11/12  3.60       02/17/21  3.87  15    Assessment/ Plan: AKI on CKD 3b- b/l creat 1.5- 1.8 from 2020, eGFR 36- 45. Creat on admit 3.7 which improved to 3.1 then worsened up to 3.6 yesterday and 3.87 today. His admit creat of 3.21 may be new baseline (stage IV) compared to last labs from 2 yrs ago. UA pend, [place] w/o obstruction. Pt mentation is poor this am and not sure uremic (less likely) vs pain  meds. We should hold off on strong pain medications and use tylenol instead for a day or so, so that the mental status can be better observed (in terms of kidney failure and progression to uremic state). No strong indication for RRT at this time. Family expressing doubts that he would want dialysis either way. He is vol overloaded but w/o resp issues, on RA.  Will give 1 dose IV lasix, get baseline CXR, UA and urine lytes. Will follow.  C5 fracture w/ spinal cord injury - sp decompression 11/10 Hypotension - seems to have resolved, got pressor support x 24 hrs H/o HTN - home meds on hold COPD Parkinson's       Kelly Splinter  MD 02/17/2021, 11:14 AM  Recent Labs  Lab 02/16/21 0514 02/17/21 0559  WBC 10.5 10.5  HGB 8.1* 8.8*   Recent Labs  Lab 02/16/21 1314 02/17/21 0559  K 5.1 5.2*  BUN 45* 50*  CREATININE 3.68* 3.87*  CALCIUM 7.0* 7.4*

## 2021-02-17 NOTE — Progress Notes (Signed)
Pt taken off bipap and placed on [place]. Pt tolerating well at this time. Will cont.to monitor

## 2021-02-17 NOTE — Progress Notes (Signed)
DTI found on sacrum during bath. Cleansed, foam placed, and WOC consult ordered.

## 2021-02-17 NOTE — Progress Notes (Signed)
In this particular circumstance it is okay for patient to wear BIPAP, order placed.  Erskine Emery MD PCCM

## 2021-02-17 NOTE — Progress Notes (Signed)
Patient ID: Mitchell Hunter, male   DOB: 03-28-1946, 75 y.o.   MRN: [id number] BP (!) 123/52   Pulse 76   Temp 99.4 F (37.4 C) (Axillary)   Resp 13   Ht 6' (1.829 m)   Wt 82.6 kg   SpO2 93%   BMI 24.68 kg/m  Alert, weak in upper and lower extremities Wound is clean, dry, no signs of infection Drain removed.

## 2021-02-18 DIAGNOSIS — N1831 Chronic kidney disease, stage 3a: Secondary | ICD-10-CM

## 2021-02-18 DIAGNOSIS — J9601 Acute respiratory failure with hypoxia: Secondary | ICD-10-CM

## 2021-02-18 DIAGNOSIS — G9520 Unspecified cord compression: Secondary | ICD-10-CM | POA: Diagnosis not present

## 2021-02-18 DIAGNOSIS — N170 Acute kidney failure with tubular necrosis: Secondary | ICD-10-CM

## 2021-02-18 LAB — POCT I-STAT 7, (LYTES, BLD GAS, ICA,H+H)
Acid-base deficit: 7 mmol/L — ABNORMAL HIGH (ref 0.0–2.0)
Bicarbonate: 18.3 mmol/L — ABNORMAL LOW (ref 20.0–28.0)
Calcium, Ion: 1.09 mmol/L — ABNORMAL LOW (ref 1.15–1.40)
HCT: 24 % — ABNORMAL LOW (ref 39.0–52.0)
Hemoglobin: 8.2 g/dL — ABNORMAL LOW (ref 13.0–17.0)
O2 Saturation: 96 %
Potassium: 4.3 mmol/L (ref 3.5–5.1)
Sodium: 136 mmol/L (ref 135–145)
TCO2: 19 mmol/L — ABNORMAL LOW (ref 22–32)
pCO2 arterial: 33.9 mmHg (ref 32.0–48.0)
pH, Arterial: 7.339 — ABNORMAL LOW (ref 7.350–7.450)
pO2, Arterial: 85 mmHg (ref 83.0–108.0)

## 2021-02-18 LAB — BASIC METABOLIC PANEL
Anion gap: 16 — ABNORMAL HIGH (ref 5–15)
BUN: 52 mg/dL — ABNORMAL HIGH (ref 8–23)
CO2: 17 mmol/L — ABNORMAL LOW (ref 22–32)
Calcium: 7.7 mg/dL — ABNORMAL LOW (ref 8.9–10.3)
Chloride: 105 mmol/L (ref 98–111)
Creatinine, Ser: 3.71 mg/dL — ABNORMAL HIGH (ref 0.61–1.24)
GFR, Estimated: 16 mL/min — ABNORMAL LOW (ref >60)
Glucose, Bld: 81 mg/dL (ref 70–99)
Potassium: 4.4 mmol/L (ref 3.5–5.1)
Sodium: 138 mmol/L (ref 135–145)

## 2021-02-18 LAB — GLUCOSE, CAPILLARY
Glucose-Capillary: 116 mg/dL — ABNORMAL HIGH (ref 70–99)
Glucose-Capillary: 122 mg/dL — ABNORMAL HIGH (ref 70–99)
Glucose-Capillary: 125 mg/dL — ABNORMAL HIGH (ref 70–99)
Glucose-Capillary: 129 mg/dL — ABNORMAL HIGH (ref 70–99)
Glucose-Capillary: 84 mg/dL (ref 70–99)
Glucose-Capillary: 86 mg/dL (ref 70–99)

## 2021-02-18 MED ORDER — HYDROCODONE-ACETAMINOPHEN 5-325 MG PO TABS: 1.0000 | ORAL_TABLET | Freq: Four times a day (QID) | ORAL | Status: DC | PRN

## 2021-02-18 MED ORDER — HYDROCODONE-ACETAMINOPHEN 5-325 MG PO TABS
1.5000 | ORAL_TABLET | Freq: Four times a day (QID) | ORAL | Status: DC | PRN
  Administered 2021-02-18: 1.5 via ORAL
  Filled 2021-02-18: qty 2

## 2021-02-18 MED ORDER — HYDROCODONE-ACETAMINOPHEN 7.5-325 MG PO TABS: 1.0000 | ORAL_TABLET | Freq: Four times a day (QID) | ORAL | Status: DC | PRN

## 2021-02-18 NOTE — Progress Notes (Signed)
ABG reviewed. Plan to complete current bag of sodium bicarbonate and then stop infusion.  End time added to current order.     Noe Gens, MSN, APRN, NP-C, AGACNP-BC [facility] 02/18/2021, 10:55 AM   Please see [email or web address] for pager details.   From 7A-7P if no response, please call [phone] After hours, please call ELink [phone]

## 2021-02-18 NOTE — Progress Notes (Signed)
Inpatient Rehab Admissions Coordinator:   Therapy recommending SNF.  WOuld not be able to get CIR auth with [facility] Medicare with SNF recs so will sign off at this time.  If pt improves please feel free to re consult CIR.    Shann Medal, PT, DPT Admissions Coordinator [phone] 02/18/21  4:52 PM

## 2021-02-18 NOTE — Progress Notes (Addendum)
NAME:  Mitchell Hunter, MRN:  [id number], DOB:  [birth year], LOS: 4 ADMISSION DATE:  02/14/2021, CONSULTATION DATE:  02/15/2021 REFERRING MD:  Dr. Reatha Armour, CHIEF COMPLAINT:  Fall   History of Present Illness:  75 year old M with prior history of COPD, HTN, HLD, DM, parkinson's disease, and CKD who presented to [facility] after a fall.   Reports he fell several weeks ago but did not seek treatment.  He fell on the morning of 10/11, backwards hitting his head after he lost his balance.  No loss of consciousness, but following fall, he was noted to have weakness and altered sensation in his upper and lower extremities, as well as neck pain, and some dysarthria.  He was maintained in cervical collar.  He presented as a code stroke with EMS.   He was found on stroke workup to have acute fracture of C5 anterior vertebral body fracture with retrolisthesis and severe cervical stenosis.  NSGY was emergently consulted, who admitted patient to Neuro ICU.  He underwent MRI which confirmed severe stenosis C5-6 with cord compression and moderate to severe stenosis at C6-7, with cord signal change.  He was taken for emergent posterior cervical decompression and laminectomy C4-7 with posterior instrumentation C3-T1 that evening by Dr. Reatha Armour.  Today, he has had lower pressure with MAP in the 50-60s, with ideal goal of 70-75 per neurosurgery despite fluid bolus.  Also noted to have worsening renal function.   PCCM consulted to assist with medical management.    Pertinent  Medical History  COPD, HTN, HLD, DM, parkinson's, anxiety, gout, CKD 3B, chronic low back and neck pain  Significant Hospital Events: Including procedures, antibiotic start and stop dates in addition to other pertinent events   11/10 admitted s/p fall with cervical fracture. S/p cervical decompression and laminectomy 11/11 PCCM consulted for hypotension and AKI 11/13 Weaned off vasopressors.   Interim History / Subjective:  Afebrile / WBC 10.5 Family  conversations from yesterday noted > pt ok for BiPAP but if further respiratory decline they would not want further intervention [name] 2L  Glucose 80-90's I/O 1.3L UOP, +1.2L in last 24 hours Pt denies acute needs   Objective   Blood pressure (!) 132/56, pulse 67, temperature 98 F (36.7 C), temperature source Oral, resp. rate 19, height 6' (1.829 m), weight 82.6 kg, SpO2 98 %.        Intake/Output Summary (Last 24 hours) at 02/18/2021 0736 Last data filed at 02/18/2021 0600 Gross per 24 hour  Intake 2571.48 ml  Output 1355 ml  Net 1216.48 ml   Filed Weights   02/14/21 0900 02/14/21 0952  Weight: 83 kg 82.6 kg   Physical Exam: General: adult male lying in bed in NAD HEENT: MM pink/moist, [place] O2, right neck bandage c/d/i Neuro: lying in bed with eyes closed, awakens to voice, speech clear, MAE, weak in upper/lower extremities CV: s1s2 RRR, no m/r/g PULM: non-labored at rest, lungs bilaterally distant  GI: soft, bsx4 active  Extremities: warm/dry, no edema  Skin: no rashes or lesions  Resolved Hospital Problem list   Post Operative Hypotension - resolved 11/13  Assessment & Plan:   C5 fracture with severe cervical spinal stenosis and spinal cord injury s/p c-decompression/laminectomy S/p posterior cervical decompression, laminectomy C4-7 with posterior instrumentation C3-T1 with quadriplegia -post operative  pain control & wound care per NSGY  -maintain C-Collar  -PT/OT efforts as able   At Risk Aspiration  Passed SLP- FEES   -aspiration precautions  -appreciate SLP  efforts   AKI on CKD Stage 3   Acute Metabolic Acidosis  Mild hyperkalemia without EKG changes   Anticipate slow renal recovery given baseline disease, acute hypoperfusion. Renal US unremarkable. 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   -Appreciate nephrology assistance with patient care  -Family expressed he would not HD  -hold home ACE -sodium bicarbonate infusion at 140ml/hr > assess ABG at 1000 with BMP, likely can  stop bicarb and discontinue aline -lasix per Nephrology  -Trend BMP / urinary output -Replace electrolytes as indicated -Avoid nephrotoxic agents, ensure adequate renal perfusion  Hx HTN -hold home antihypertensives   Hx COPD Current tobacco abuse -wean O2 for sats >90% -PRN albuterol  -promote pulmonary hygiene -IS, mobilize  -tobacco cessation when able   Parkinson's Disease -continue sinemet -PRN Xanax for tremor -continue home neurontin  -minimize sedating medications   Best Practice (right click and "Reselect all SmartList Selections" daily)  Diet/type: Regular consistency (see orders)- per SLP recs DVT prophylaxis: prophylactic heparin  GI prophylaxis: PPI Lines: N/A Foley:  Yes, and it is still needed Code Status:  full code Last date of multidisciplinary goals of care discussion: per primary. Note discussion from Dr. Tamala Julian 11/13 - DNR/DNI.  Ok for BiPAP short term.    Anticipate he will transfer out of ICU today.  PCCM will continue to follow while in ICU.   Critical care time:     Noe Gens, MSN, APRN, NP-C, AGACNP-BC [facility] 02/18/2021, 7:41 AM   Please see [email or web address] for pager details.   From 7A-7P if no response, please call [phone] After hours, please call ELink [phone]

## 2021-02-18 NOTE — Progress Notes (Signed)
Subjective: Patient reports that he has incisional pain and pain all over. He stated that he does not feel well in general but is unable to elaborate. No acute events overnight.   Objective: Vital signs in last 24 hours: Temp:  [98 F (36.7 C)-99.4 F (37.4 C)] 98.6 F (37 C) (11/14 0804) Pulse Rate:  [64-83] 67 (11/14 0600) Resp:  [0-25] 19 (11/14 0600) BP: (113-138)/(52-102) 132/56 (11/14 0600) SpO2:  [92 %-100 %] 98 % (11/14 0600) Arterial Line BP: (126-161)/(45-78) 161/51 (11/14 0600)  Intake/Output from previous day: 11/13 0701 - 11/14 0700 In: 2571.5 [P.O.:240; I.V.:1831.5; IV Piggyback:500] Out: 3086 [Urine:1355] Intake/Output this shift: No intake/output data recorded.  Physical Exam: Patient is awake, A/O X 4, and conversant. He is in NAD and VSS. His affect is flat. Speech is fluent and appropriate. LEU 0/5, RLE 1/5. BLE 0/5. Sensation decreased in his BLE and BUE. PERLA, EOMI. CNs grossly intact. Dressing is clean dry intact. Incision is well approximated with no drainage, erythema, or edema. Hard cervical collar in place.      Lab Results: Recent Labs    02/16/21 0514 02/17/21 0559 02/17/21 1354 02/17/21 2349  WBC 10.5 10.5  --   --   HGB 8.1* 8.8* 13.6 8.5*  HCT 26.1* 27.7* 40.0 25.0*  PLT 187 192  --   --    BMET Recent Labs    02/16/21 1314 02/17/21 0559 02/17/21 1354 02/17/21 2349  NA 132* 135 135 135  K 5.1 5.2* 5.0 4.4  CL 110 113*  --   --   CO2 15* 14*  --   --   GLUCOSE 97 97  --   --   BUN 45* 50*  --   --   CREATININE 3.68* 3.87*  --   --   CALCIUM 7.0* 7.4*  --   --     Studies/Results: US RENAL  Result Date: 02/16/2021 CLINICAL DATA:  Acute on chronic renal failure (HCC) N17.9, N18.9 (ICD-10-CM) EXAM: RENAL / URINARY TRACT ULTRASOUND COMPLETE COMPARISON:  CT February 14, 2021. FINDINGS: Right Kidney: Renal measurements: 9.1 x 4.1 x 4.4 cm = volume: 86 mL. Echogenicity within normal limits. No hydronephrosis. Approximately 2.0 x 1.6 x  1.6 cm cyst. Left Kidney: Renal measurements: 9.9 x 5.2 x 5.5 cm. = volume: 146 mL. Echogenicity within normal limits. No hydronephrosis. Approximately 2.8 x 1.5 x 1.2 cm cyst. Bladder: Decompressed around a dilated Foley catheter balloon. This limits assessment. Other: Limited study due to patient immobility (in the ICU). IMPRESSION: 1. No hydronephrosis. 2. Bilateral renal cysts. Electronically Signed   By: Margaretha Sheffield M.D.   On: 02/16/2021 10:10   DG CHEST PORT 1 VIEW  Result Date: 02/17/2021 CLINICAL DATA:  Volume overload EXAM: PORTABLE CHEST 1 VIEW COMPARISON:  February 16, 2020 FINDINGS: The cardiomediastinal silhouette is unchanged in contour. Blunting of LEFT costophrenic angle most consistent with small LEFT pleural effusion. No pneumothorax. LEFT retrocardiac opacity, likely atelectasis. Visualized abdomen is unremarkable. Status post posterior fixation of the cervical spine. IMPRESSION: Small LEFT pleural effusion with LEFT basilar homogeneous opacity, likely atelectasis. Electronically Signed   By: Valentino Saxon M.D.   On: 02/17/2021 15:39    Assessment/Plan: Patient with C5 fracture with significant cervical stenosis and spinal cord injury from fall. Patient is post op s/p posterior cervical decompression, laminectomy C4-7 with posterior instrumentation C3-T1. The patient reports diffuse pain throughout and a granulized felling of being unwell.Marland Kitchen His neurological examination remains severely impaired due to his  injury. Will consult palliative medicine.      -Maintain normotensive pressure -Pain control -[place] -Continue cervical collar -SLP -PT/OT  LOS: 4 days     Marvis Moeller, DNP, NP-C 02/18/2021, 8:43 AM

## 2021-02-18 NOTE — Progress Notes (Signed)
Orthopedic Tech Progress Note Patient Details:  Mitchell Hunter 11-03-1945 [id number]  Called in order to [facility] for bilateral HAND SPLINTS  Patient ID: [name], male   DOB: 12/07/1945, 75 y.o.   MRN: [id number]  Janit Pagan 02/18/2021, 12:32 PM

## 2021-02-18 NOTE — Progress Notes (Signed)
Physical Therapy Treatment Patient Details Name: Mitchell Hunter MRN: [id number] DOB: 1945/05/31 Today's Date: 02/18/2021   History of Present Illness 75 y/o male presented to ED on 11/10 after falling backwards and hitting head. CT C-spine showed traumatic recent fx in body of C5 vertebra and retrolisthesis at C5-6 level with advanced biforaminal impingement at same level. S/p posterior cervical decompression C4-C7 and fusion C3-T1 on 11/10.  PMH: Parkinson's disease, hypertension, hyperlipidemia, diabetes, gout, anxiety, COPD    PT Comments    Pt received in bed, resting. Pt easily roused, agreeable to PT/OT session. Pt is requiring total care +2 for chair position and pull to sit position, able to briefly maintain forward sitting via elbow propping and mod truncal assist. Pt demonstrating 2/5 knee extensor strength on R, no active muscular contraction noted on L. PT to continue to follow.   Recommendations for follow up therapy are one component of a multi-disciplinary discharge planning process, led by the attending physician.  Recommendations may be updated based on patient status, additional functional criteria and insurance authorization.  Follow Up Recommendations  Skilled nursing-short term rehab (<3 hours/day)     Assistance Recommended at Discharge Frequent or constant Supervision/Assistance  Equipment Recommendations  Other (comment);Hospital bed;Wheelchair cushion (measurements PT);Wheelchair (measurements PT) (hoyer lift)    Recommendations for Other Services       Precautions / Restrictions Precautions Precautions: Fall;Cervical Required Braces or Orthoses: Cervical Brace Cervical Brace: Hard collar (need clarification - donned in supine)     Mobility  Bed Mobility Overal bed mobility: Needs Assistance Bed Mobility: Rolling           General bed mobility comments: total A +2 for any bed mobility    Transfers                         Ambulation/Gait                   Stairs             Wheelchair Mobility    Modified Rankin (Stroke Patients Only)       Balance Overall balance assessment: Needs assistance   Sitting balance-Leahy Scale: Zero Sitting balance - Comments: egressed; pt unable to maintain trunk control; semi propping with BUE on legs with assitance                                    Cognition Arousal/Alertness: Awake/alert Behavior During Therapy: Flat affect Overall Cognitive Status: Impaired/Different from baseline Area of Impairment: Orientation;Attention;Memory;Following commands;Safety/judgement;Awareness;Problem solving                 Orientation Level: Disoriented to;Time;Place ("december"; "home") Current Attention Level: Sustained Memory: Decreased recall of precautions;Decreased short-term memory Following Commands: Follows one step commands with increased time Safety/Judgement: Decreased awareness of safety;Decreased awareness of deficits Awareness: Intellectual Problem Solving: Slow processing;Decreased initiation          Exercises General Exercises - Upper Extremity Shoulder Flexion: AAROM;Both;10 reps Shoulder ABduction: AAROM;Both;10 reps;Seated Elbow Flexion: AROM;AAROM;Both;20 reps;Seated Elbow Extension: AAROM;PROM;Both;15 reps;Seated;Supine Digit Composite Flexion: PROM;Both;15 reps;Seated Composite Extension: PROM;Both;15 reps;Seated General Exercises - Lower Extremity Long Arc Quad: PROM;Left;AAROM;Right;5 reps;Seated Other Exercises Other Exercises: propped sitting with elbows on legs in egress position Other Exercises: Began education with wife regarding BUE HEP - will benefit fomr HEP Other Exercises: Educated wife on dependent edema and need to keep B hands  elevated/retrograde massage    General Comments        Pertinent Vitals/Pain Pain Assessment: Faces Faces Pain Scale: Hurts a little bit Pain Location: back,  during pull to sit Pain Descriptors / Indicators: Discomfort;Grimacing Pain Intervention(s): Limited activity within patient's tolerance;Monitored during session;Repositioned    Home Living                          Prior Function            PT Goals (current goals can now be found in the care plan section) Acute Rehab PT Goals Patient Stated Goal: did not state PT Goal Formulation: With patient Time For Goal Achievement: 03/01/21 Potential to Achieve Goals: Fair Progress towards PT goals: Progressing toward goals    Frequency    Min 3X/week      PT Plan Current plan remains appropriate    Co-evaluation PT/OT/SLP Co-Evaluation/Treatment: Yes Reason for Co-Treatment: Complexity of the patient's impairments (multi-system involvement);For patient/therapist safety;To address functional/ADL transfers PT goals addressed during session: Mobility/safety with mobility;Strengthening/ROM OT goals addressed during session: ADL's and self-care;Strengthening/ROM      AM-PAC PT "6 Clicks" Mobility   Outcome Measure  Help needed turning from your back to your side while in a flat bed without using bedrails?: Total Help needed moving from lying on your back to sitting on the side of a flat bed without using bedrails?: Total Help needed moving to and from a bed to a chair (including a wheelchair)?: Total Help needed standing up from a chair using your arms (e.g., wheelchair or bedside chair)?: Total Help needed to walk in hospital room?: Total Help needed climbing 3-5 steps with a railing? : Total 6 Click Score: 6    End of Session Equipment Utilized During Treatment: Cervical collar;Oxygen Activity Tolerance: Patient limited by fatigue Patient left: in bed;with call bell/phone within reach;with family/visitor present;with bed alarm set Nurse Communication: Mobility status PT Visit Diagnosis: Muscle weakness (generalized) (M62.81);Difficulty in walking, not elsewhere  classified (R26.2);Other symptoms and signs involving the nervous system (R29.898);History of falling (Z91.81)     Time: 8372-9021 PT Time Calculation (min) (ACUTE ONLY): 29 min  Charges:  $Therapeutic Activity: 8-22 mins                     Stacie Glaze, PT DPT Acute Rehabilitation Services Pager [phone]  Office [phone]    Roxine Caddy E Ruffin Pyo 02/18/2021, 3:39 PM

## 2021-02-18 NOTE — TOC CAGE-AID Note (Signed)
Transition of Care [facility]) - CAGE-AID Screening   Patient Details  Name: Mitchell Hunter MRN: [id number] Date of Birth: [birth year]  Clinical Narrative:  No current alcohol or drug use. No need for resources at this time.  CAGE-AID Screening:    Have You Ever Felt You Ought to Cut Down on Your Drinking or Drug Use?: No Have People Annoyed You By Critizing Your Drinking Or Drug Use?: No Have You Felt Bad Or Guilty About Your Drinking Or Drug Use?: No Have You Ever Had a Drink or Used Drugs First Thing In The Morning to Steady Your Nerves or to Get Rid of a Hangover?: No CAGE-AID Score: 0  Substance Abuse Education Offered: No

## 2021-02-18 NOTE — Progress Notes (Addendum)
Occupational Therapy Treatment Patient Details Name: Mitchell Hunter MRN: [id number] DOB: [birth year] Today's Date: 02/18/2021   History of present illness 75 y/o male presented to ED on 11/10 after falling backwards and hitting head. CT C-spine showed traumatic recent fx in body of C5 vertebra and retrolisthesis at C5-6 level with advanced biforaminal impingement at same level. S/p posterior cervical decompression C4-C7 and fusion C3-T1 on 11/10.  PMH: Parkinson's disease, hypertension, hyperlipidemia, diabetes, gout, anxiety, COPD   OT comments  Wife present during session and states her husband had been having increased difficulty with ambulation prior to fall. Attempted to work on trunk control using Egress position. Pt with zero sitting balance. BUE strength improved minimally from session on Friday. Minimal increase in wrist extension, R stronger than LUE as noted below. Began educating wife in [place] in [place]. B hand splints ordered  - to be worn 2 hrs on/2 hrs off. Please keep B hands elevated to decrease dependent edema. Will be appropriate for rehab at SNF. Asked nsg to get clarification regarding cervical collar. Will continue to follow. Wife educated on proper positioning to help with self feeding and reduce risk of aspiration. Also encouraged wife to complete IS exercises with pt every hour. Wife verbalized understanding.    Recommendations for follow up therapy are one component of a multi-disciplinary discharge planning process, led by the attending physician.  Recommendations may be updated based on patient status, additional functional criteria and insurance authorization.    Follow Up Recommendations  Skilled nursing-short term rehab (<3 hours/day)    Assistance Recommended at Discharge Frequent or constant Supervision/Assistance  Equipment Recommendations  BSC/3in1;Wheelchair (measurements OT);Wheelchair cushion (measurements OT);Hospital bed;Other (comment)    Recommendations for  Other Services Other (comment)    Precautions / Restrictions Precautions Precautions: Fall;Cervical Required Braces or Orthoses: Cervical Brace Cervical Brace: Hard collar (need clarification)       Mobility Bed Mobility Overal bed mobility: Needs Assistance Bed Mobility: Rolling           General bed mobility comments: total A +2 for any bed mobility    Transfers                         Balance Overall balance assessment: Needs assistance   Sitting balance-Leahy Scale: Zero Sitting balance - Comments: egressed; pt unable to maintain trunk control; semi propping with BUE on legs with assitance                                   ADL either performed or assessed with clinical judgement   ADL Overall ADL's : Needs assistance/impaired                                       General ADL Comments: total A at this time with all ADL    Extremity/Trunk Assessment Upper Extremity Assessment Upper Extremity Assessment: RUE deficits/detail;LUE deficits/detail RUE Deficits / Details: unable to actively supinate/pronate; unable to touch mouth with hand; minimal increase in wrist extension but unable to acieve neutral; no active digit movement; no active tenodesis RUE Coordination: decreased fine motor;decreased gross motor LUE Deficits / Details: similar to R;  however weaker; more difficulty lifting off bed; increased edema in hand   Lower Extremity Assessment Lower Extremity Assessment: Defer to PT evaluation  Vision   Additional Comments: wears glasses   Perception     Praxis      Cognition Arousal/Alertness: Awake/alert Behavior During Therapy: Flat affect Overall Cognitive Status: Impaired/Different from baseline Area of Impairment: Orientation;Attention;Memory;Following commands;Safety/judgement;Awareness;Problem solving                 Orientation Level: Disoriented to;Time;Place ("december"; "home") Current  Attention Level: Sustained Memory: Decreased recall of precautions;Decreased short-term memory Following Commands: Follows one step commands with increased time Safety/Judgement: Decreased awareness of safety;Decreased awareness of deficits Awareness: Intellectual Problem Solving: Slow processing;Decreased initiation            Exercises Exercises: Other exercises General Exercises - Upper Extremity Shoulder Flexion: AAROM;Both;10 reps Shoulder ABduction: AAROM;Both;10 reps;Seated Elbow Flexion: AROM;AAROM;Both;20 reps;Seated Elbow Extension: AAROM;PROM;Both;15 reps;Seated;Supine Digit Composite Flexion: PROM;Both;15 reps;Seated Composite Extension: PROM;Both;15 reps;Seated Other Exercises Other Exercises: worked on trunk control in unsupported sitting in Egress position Other Exercises: Began education with wife regarding BUE HEP - will benefit fomr HEP Other Exercises: Educated wife on dependent edema and need to keep B hands elevated/retrograde massage   Shoulder Instructions       General Comments      Pertinent Vitals/ Pain       Pain Assessment: Faces Faces Pain Scale: No hurt  Home Living                                          Prior Functioning/Environment              Frequency  Min 2X/week        Progress Toward Goals  OT Goals(current goals can now be found in the care plan section)  Progress towards OT goals: Progressing toward goals  Acute Rehab OT Goals Patient Stated Goal: per wife for her husband to get stronger OT Goal Formulation: With patient/family Time For Goal Achievement: 03/01/21 Potential to Achieve Goals: Fair ADL Goals Pt Will Perform Eating: with max assist;sitting;with adaptive utensils Pt Will Perform Grooming: with max assist;sitting;with adaptive equipment Pt Will Perform Upper Body Bathing: with max assist;bed level Additional ADL Goal #1: Pt will use use soft touch with 50% accuracy  Plan Discharge  plan remains appropriate    Co-evaluation    PT/OT/SLP Co-Evaluation/Treatment: Yes Reason for Co-Treatment: Complexity of the patient's impairments (multi-system involvement);For patient/therapist safety;To address functional/ADL transfers   OT goals addressed during session: ADL's and self-care;Strengthening/ROM      AM-PAC OT "6 Clicks" Daily Activity     Outcome Measure   Help from another person eating meals?: Total Help from another person taking care of personal grooming?: Total Help from another person toileting, which includes using toliet, bedpan, or urinal?: Total Help from another person bathing (including washing, rinsing, drying)?: Total Help from another person to put on and taking off regular upper body clothing?: Total Help from another person to put on and taking off regular lower body clothing?: Total 6 Click Score: 6    End of Session    OT Visit Diagnosis: Unsteadiness on feet (R26.81);Other abnormalities of gait and mobility (R26.89);Repeated falls (R29.6);Muscle weakness (generalized) (M62.81);Other symptoms and signs involving the nervous system (R29.898)   Activity Tolerance Patient tolerated treatment well;Patient limited by fatigue   Patient Left in bed;with call bell/phone within reach;with family/visitor present (pillows under L hipe)   Nurse Communication Mobility status;Other (comment) (need for splints; B Prevalon boots in room)  Time: 0397-9536 OT Time Calculation (min): 45 min  Charges: OT General Charges $OT Visit: 1 Visit OT Treatments $Self Care/Home Management : 8-22 mins $Neuromuscular Re-education: 8-22 mins  [facility], OT 02/18/2021   Carleen Rhue,HILLARY 02/18/2021, 12:42 PM

## 2021-02-18 NOTE — Progress Notes (Signed)
Patient ID: Mitchell Hunter, male   DOB: May 29, 1945, 75 y.o.   MRN: [id number] S: No events overnight. O:BP (!) 132/56   Pulse 67   Temp 98.6 F (37 C) (Oral)   Resp 19   Ht 6' (1.829 m)   Wt 82.6 kg   SpO2 98%   BMI 24.68 kg/m   Intake/Output Summary (Last 24 hours) at 02/18/2021 1013 Last data filed at 02/18/2021 0600 Gross per 24 hour  Intake 2071.48 ml  Output 1240 ml  Net 831.48 ml   Intake/Output: I/O last 3 completed shifts: In: 2616.8 [P.O.:240; I.V.:1876.8; IV Piggyback:500] Out: 3716 [Urine:1615; Drains:40]  Intake/Output this shift:  No intake/output data recorded. Weight change:  Gen:NAD CVS: RRR Resp: CTA Abd: +Bs, soft, NT/Nd Ext: no edema  Recent Labs  Lab 02/14/21 0857 02/14/21 0901 02/15/21 0500 02/16/21 0514 02/16/21 1314 02/17/21 0559 02/17/21 1354 02/17/21 2349 02/18/21 0857  NA 135 135 135 134* 132* 135 135 135 136  K 5.7* 5.5* 5.7* 4.9 5.1 5.2* 5.0 4.4 4.3  CL 107 107 107 109 110 113*  --   --   --   CO2 22  --  18* 17* 15* 14*  --   --   --   GLUCOSE 114* 107* 171* 99 97 97  --   --   --   BUN 36* 36* 42* 45* 45* 50*  --   --   --   CREATININE 3.21* 3.70* 3.16* 3.60* 3.68* 3.87*  --   --   --   ALBUMIN 2.4*  --   --   --   --   --   --   --   --   CALCIUM 8.5*  --  7.2* 6.8* 7.0* 7.4*  --   --   --   AST 15  --   --   --   --   --   --   --   --   ALT <5  --   --   --   --   --   --   --   --    Liver Function Tests: Recent Labs  Lab 02/14/21 0857  AST 15  ALT <5  ALKPHOS 79  BILITOT 0.7  PROT 5.3*  ALBUMIN 2.4*   No results for input(s): LIPASE, AMYLASE in the last 168 hours. No results for input(s): AMMONIA in the last 168 hours. CBC: Recent Labs  Lab 02/14/21 0857 02/14/21 0901 02/15/21 0500 02/16/21 0514 02/17/21 0559 02/17/21 1354 02/17/21 2349 02/18/21 0857  WBC 6.9  --  11.5* 10.5 10.5  --   --   --   NEUTROABS 4.6  --   --   --   --   --   --   --   HGB 11.1*   < > 9.7* 8.1* 8.8* 13.6 8.5* 8.2*  HCT 33.0*    < > 29.4* 26.1* 27.7* 40.0 25.0* 24.0*  MCV 98.8  --  99.7 104.4* 104.1*  --   --   --   PLT 201  --  207 187 192  --   --   --    < > = values in this interval not displayed.   Cardiac Enzymes: No results for input(s): CKTOTAL, CKMB, CKMBINDEX, TROPONINI in the last 168 hours. CBG: Recent Labs  Lab 02/17/21 1533 02/17/21 2009 02/17/21 2346 02/18/21 0405 02/18/21 0756  GLUCAP 82 88 92 86 84    Iron Studies: No results  for input(s): IRON, TIBC, TRANSFERRIN, FERRITIN in the last 72 hours. Studies/Results: DG CHEST PORT 1 VIEW  Result Date: 02/17/2021 CLINICAL DATA:  Volume overload EXAM: PORTABLE CHEST 1 VIEW COMPARISON:  February 16, 2020 FINDINGS: The cardiomediastinal silhouette is unchanged in contour. Blunting of LEFT costophrenic angle most consistent with small LEFT pleural effusion. No pneumothorax. LEFT retrocardiac opacity, likely atelectasis. Visualized abdomen is unremarkable. Status post posterior fixation of the cervical spine. IMPRESSION: Small LEFT pleural effusion with LEFT basilar homogeneous opacity, likely atelectasis. Electronically Signed   By: Valentino Saxon M.D.   On: 02/17/2021 15:39    carbidopa-levodopa  3 tablet Oral BID   Chlorhexidine Gluconate Cloth  6 each Topical Daily   docusate sodium  100 mg Oral BID   escitalopram  20 mg Oral Daily   gabapentin  300 mg Oral BID   heparin injection (subcutaneous)  5,000 Units Subcutaneous Q8H   pantoprazole  40 mg Oral Daily   sodium chloride flush  3 mL Intravenous Once    BMET    Component Value Date/Time   NA 136 02/18/2021 0857   K 4.3 02/18/2021 0857   CL 113 (H) 02/17/2021 0559   CO2 14 (L) 02/17/2021 0559   GLUCOSE 97 02/17/2021 0559   BUN 50 (H) 02/17/2021 0559   CREATININE 3.87 (H) 02/17/2021 0559   CALCIUM 7.4 (L) 02/17/2021 0559   GFRNONAA 15 (L) 02/17/2021 0559   GFRAA 41 (L) 01/11/2019 1335   CBC    Component Value Date/Time   WBC 10.5 02/17/2021 0559   RBC 2.66 (L) 02/17/2021 0559    HGB 8.2 (L) 02/18/2021 0857   HCT 24.0 (L) 02/18/2021 0857   PLT 192 02/17/2021 0559   MCV 104.1 (H) 02/17/2021 0559   MCH 33.1 02/17/2021 0559   MCHC 31.8 02/17/2021 0559   RDW 15.3 02/17/2021 0559   LYMPHSABS 1.4 02/14/2021 0857   MONOABS 0.6 02/14/2021 0857   EOSABS 0.3 02/14/2021 0857   BASOSABS 0.1 02/14/2021 0857      Assessment/ Plan: AKI on CKD 3b- b/l creat 1.5- 1.8 from 2020, eGFR 36- 45. Creat on admit 3.7 which improved to 3.1 then worsened up to 3.6 yesterday and 3.87 today. His admit creat of 3.21 may be new baseline (stage IV) compared to last labs from 2 yrs ago. UA pend, [place] w/o obstruction. No strong indication for RRT at this time. Family expressing doubts that he would want dialysis either way. He is vol overloaded but w/o resp issues, on RA.  responded to IV lasix yesterday.  Awaiting labs from today.  Will continue to follow.  C5 fracture w/ spinal cord injury - sp decompression 11/10 Hypotension - seems to have resolved, got pressor support x 24 hrs H/o HTN - home meds on hold COPD Parkinson's   Donetta Potts, MD [facility] [phone]

## 2021-02-19 DIAGNOSIS — W102XXA Fall (on)(from) incline, initial encounter: Secondary | ICD-10-CM

## 2021-02-19 DIAGNOSIS — J9601 Acute respiratory failure with hypoxia: Secondary | ICD-10-CM | POA: Diagnosis not present

## 2021-02-19 DIAGNOSIS — G8250 Quadriplegia, unspecified: Secondary | ICD-10-CM

## 2021-02-19 DIAGNOSIS — G9520 Unspecified cord compression: Secondary | ICD-10-CM | POA: Diagnosis not present

## 2021-02-19 DIAGNOSIS — Z7189 Other specified counseling: Secondary | ICD-10-CM

## 2021-02-19 DIAGNOSIS — Z515 Encounter for palliative care: Secondary | ICD-10-CM

## 2021-02-19 LAB — BASIC METABOLIC PANEL
Anion gap: 12 (ref 5–15)
BUN: 48 mg/dL — ABNORMAL HIGH (ref 8–23)
CO2: 22 mmol/L (ref 22–32)
Calcium: 7.9 mg/dL — ABNORMAL LOW (ref 8.9–10.3)
Chloride: 100 mmol/L (ref 98–111)
Creatinine, Ser: 3.13 mg/dL — ABNORMAL HIGH (ref 0.61–1.24)
GFR, Estimated: 20 mL/min — ABNORMAL LOW (ref >60)
Glucose, Bld: 110 mg/dL — ABNORMAL HIGH (ref 70–99)
Potassium: 4 mmol/L (ref 3.5–5.1)
Sodium: 134 mmol/L — ABNORMAL LOW (ref 135–145)

## 2021-02-19 LAB — GLUCOSE, CAPILLARY
Glucose-Capillary: 115 mg/dL — ABNORMAL HIGH (ref 70–99)
Glucose-Capillary: 118 mg/dL — ABNORMAL HIGH (ref 70–99)
Glucose-Capillary: 111 mg/dL — ABNORMAL HIGH (ref 70–99)

## 2021-02-19 MED ORDER — HALOPERIDOL 0.5 MG PO TABS
0.5000 mg | ORAL_TABLET | ORAL | Status: DC | PRN
  Filled 2021-02-19: qty 1

## 2021-02-19 MED ORDER — POLYVINYL ALCOHOL 1.4 % OP SOLN: 1.0000 [drp] | Freq: Four times a day (QID) | OPHTHALMIC | Status: DC | PRN

## 2021-02-19 MED ORDER — LORAZEPAM 1 MG PO TABS: 2.0000 mg | ORAL_TABLET | ORAL | Status: DC | PRN

## 2021-02-19 MED ORDER — HALOPERIDOL LACTATE 5 MG/ML IJ SOLN: 0.5000 mg | INTRAMUSCULAR | Status: DC | PRN

## 2021-02-19 MED ORDER — BIOTENE DRY MOUTH MT LIQD: 15.0000 mL | OROMUCOSAL | Status: DC | PRN

## 2021-02-19 MED ORDER — GLYCOPYRROLATE 1 MG PO TABS
1.0000 mg | ORAL_TABLET | ORAL | Status: DC | PRN
  Filled 2021-02-19: qty 1

## 2021-02-19 MED ORDER — HYDROMORPHONE HCL 1 MG/ML IJ SOLN
0.5000 mg | INTRAMUSCULAR | Status: DC | PRN
  Administered 2021-02-19 – 2021-02-20 (×7): 0.5 mg via INTRAVENOUS
  Filled 2021-02-19 (×8): qty 1

## 2021-02-19 MED ORDER — GLYCOPYRROLATE 0.2 MG/ML IJ SOLN: 0.2000 mg | INTRAMUSCULAR | Status: DC | PRN

## 2021-02-19 MED ORDER — LORAZEPAM 2 MG/ML IJ SOLN
2.0000 mg | INTRAMUSCULAR | Status: AC
  Administered 2021-02-19: 2 mg via INTRAVENOUS
  Filled 2021-02-19: qty 1

## 2021-02-19 MED ORDER — HALOPERIDOL LACTATE 2 MG/ML PO CONC
0.5000 mg | ORAL | Status: DC | PRN
  Filled 2021-02-19: qty 0.3

## 2021-02-19 NOTE — Progress Notes (Signed)
Patient ID: Mitchell Hunter, male   DOB: [birth year], 75 y.o.   MRN: [id number] S: Not feeling well this morning.  No events overnight. O:BP (!) 161/64   Pulse 65   Temp 98.6 F (37 C)   Resp 19   Ht 6' (1.829 m)   Wt 82.6 kg   SpO2 97%   BMI 24.68 kg/m   Intake/Output Summary (Last 24 hours) at 02/19/2021 1046 Last data filed at 02/19/2021 0600 Gross per 24 hour  Intake 1631.23 ml  Output 580 ml  Net 1051.23 ml   Intake/Output: I/O last 3 completed shifts: In: 3860.9 [P.O.:480; I.V.:2880.9; Other:500] Out: 9381 [Urine:1245]  Intake/Output this shift:  No intake/output data recorded. Weight change:  Gen: Chronically ill-appearing, NAD CVS: RRR Resp:CTA Abd: +Bs, soft, NT/ND Ext: trace pretibial and 1+ scrotal edema  Recent Labs  Lab 02/14/21 0857 02/14/21 0901 02/15/21 0500 02/16/21 0514 02/16/21 1314 02/17/21 0559 02/17/21 1354 02/17/21 2349 02/18/21 0857 02/18/21 0958 02/19/21 0606  NA 135 135 135 134* 132* 135 135 135 136 138 134*  K 5.7* 5.5* 5.7* 4.9 5.1 5.2* 5.0 4.4 4.3 4.4 4.0  CL 107 107 107 109 110 113*  --   --   --  105 100  CO2 22  --  18* 17* 15* 14*  --   --   --  17* 22  GLUCOSE 114* 107* 171* 99 97 97  --   --   --  81 110*  BUN 36* 36* 42* 45* 45* 50*  --   --   --  52* 48*  CREATININE 3.21* 3.70* 3.16* 3.60* 3.68* 3.87*  --   --   --  3.71* 3.13*  ALBUMIN 2.4*  --   --   --   --   --   --   --   --   --   --   CALCIUM 8.5*  --  7.2* 6.8* 7.0* 7.4*  --   --   --  7.7* 7.9*  AST 15  --   --   --   --   --   --   --   --   --   --   ALT <5  --   --   --   --   --   --   --   --   --   --    Liver Function Tests: Recent Labs  Lab 02/14/21 0857  AST 15  ALT <5  ALKPHOS 79  BILITOT 0.7  PROT 5.3*  ALBUMIN 2.4*   No results for input(s): LIPASE, AMYLASE in the last 168 hours. No results for input(s): AMMONIA in the last 168 hours. CBC: Recent Labs  Lab 02/14/21 0857 02/14/21 0901 02/15/21 0500 02/16/21 0514 02/17/21 0559  02/17/21 1354 02/17/21 2349 02/18/21 0857  WBC 6.9  --  11.5* 10.5 10.5  --   --   --   NEUTROABS 4.6  --   --   --   --   --   --   --   HGB 11.1*   < > 9.7* 8.1* 8.8* 13.6 8.5* 8.2*  HCT 33.0*   < > 29.4* 26.1* 27.7* 40.0 25.0* 24.0*  MCV 98.8  --  99.7 104.4* 104.1*  --   --   --   PLT 201  --  207 187 192  --   --   --    < > = values in this interval not  displayed.   Cardiac Enzymes: No results for input(s): CKTOTAL, CKMB, CKMBINDEX, TROPONINI in the last 168 hours. CBG: Recent Labs  Lab 02/18/21 1611 02/18/21 2003 02/18/21 2352 02/19/21 0413 02/19/21 0824  GLUCAP 116* 129* 125* 115* 118*    Iron Studies: No results for input(s): IRON, TIBC, TRANSFERRIN, FERRITIN in the last 72 hours. Studies/Results: DG CHEST PORT 1 VIEW  Result Date: 02/17/2021 CLINICAL DATA:  Volume overload EXAM: PORTABLE CHEST 1 VIEW COMPARISON:  February 16, 2020 FINDINGS: The cardiomediastinal silhouette is unchanged in contour. Blunting of LEFT costophrenic angle most consistent with small LEFT pleural effusion. No pneumothorax. LEFT retrocardiac opacity, likely atelectasis. Visualized abdomen is unremarkable. Status post posterior fixation of the cervical spine. IMPRESSION: Small LEFT pleural effusion with LEFT basilar homogeneous opacity, likely atelectasis. Electronically Signed   By: Valentino Saxon M.D.   On: 02/17/2021 15:39    carbidopa-levodopa  3 tablet Oral BID   Chlorhexidine Gluconate Cloth  6 each Topical Daily   docusate sodium  100 mg Oral BID   escitalopram  20 mg Oral Daily   gabapentin  300 mg Oral BID   heparin injection (subcutaneous)  5,000 Units Subcutaneous Q8H   pantoprazole  40 mg Oral Daily   sodium chloride flush  3 mL Intravenous Once    BMET    Component Value Date/Time   NA 134 (L) 02/19/2021 0606   K 4.0 02/19/2021 0606   CL 100 02/19/2021 0606   CO2 22 02/19/2021 0606   GLUCOSE 110 (H) 02/19/2021 0606   BUN 48 (H) 02/19/2021 0606   CREATININE 3.13 (H)  02/19/2021 0606   CALCIUM 7.9 (L) 02/19/2021 0606   GFRNONAA 20 (L) 02/19/2021 0606   GFRAA 41 (L) 01/11/2019 1335   CBC    Component Value Date/Time   WBC 10.5 02/17/2021 0559   RBC 2.66 (L) 02/17/2021 0559   HGB 8.2 (L) 02/18/2021 0857   HCT 24.0 (L) 02/18/2021 0857   PLT 192 02/17/2021 0559   MCV 104.1 (H) 02/17/2021 0559   MCH 33.1 02/17/2021 0559   MCHC 31.8 02/17/2021 0559   RDW 15.3 02/17/2021 0559   LYMPHSABS 1.4 02/14/2021 0857   MONOABS 0.6 02/14/2021 0857   EOSABS 0.3 02/14/2021 0857   BASOSABS 0.1 02/14/2021 0857    Assessment/ Plan: AKI on CKD 3b- b/l creat 1.5- 1.8 from 2020, eGFR 36- 45. Creat on admit 3.7 which improved to 3.1 then worsened up to 3.6 yesterday and 3.87 but down to 3.13 today.  His admit creat of 3.21 may be new baseline (stage IV, followed by Dr. Joelyn Oms as an outpatient).  UA with large blood, + ketones, + protein following foley catheter placement.  [place] w/o obstruction.  No strong indication for RRT at this time. Family expressing doubts that he would want dialysis either way.   He is vol overloaded but w/o resp issues, on RA.  responded to IV lasix.   BUN/Cr improved overnight. Will continue to follow.  C5 fracture w/ spinal cord injury - sp decompression 11/10 Hypotension - seems to have resolved, got pressor support x 24 hrs H/o HTN - home meds on hold COPD - per PCCM Parkinson's - continue with outpatient meds  Donetta Potts, MD [facility] [phone]

## 2021-02-19 NOTE — Progress Notes (Signed)
Nutrition Brief Note  Chart reviewed. Patient screened for Low Braden score.  Pt now transitioning to comfort care.   No nutrition interventions planned at this time.   Please re-consult as needed.   Derrel Nip, RD, LDN (she/her/hers) Clinical Inpatient Dietitian RD Pager/After-Hours/Weekend Pager # in [place]

## 2021-02-19 NOTE — Progress Notes (Signed)
Palliative- brief note- full note to follow.   Met with patient and family. They desire to transition to full comfort only and discharge home with Hospice.   Mariana Kaufman, AGNP-C Palliative Medicine  Please call Palliative Medicine team phone with any questions [phone]. For individual providers please see AMION.  No charge

## 2021-02-19 NOTE — Progress Notes (Signed)
Subjective: Patient reports a general feeling of being unwell. He has significant posterior cervical pain with movement. No acute events overnight.   Objective: Vital signs in last 24 hours: Temp:  [97.5 F (36.4 C)-99.5 F (37.5 C)] 98.4 F (36.9 C) (11/15 0700) Pulse Rate:  [64-75] 64 (11/15 0700) Resp:  [6-31] 14 (11/15 0700) BP: (136-161)/(57-107) 161/60 (11/15 0700) SpO2:  [90 %-99 %] 98 % (11/15 0700) Arterial Line BP: (156-166)/(48-51) 166/48 (11/14 1000)  Intake/Output from previous day: 11/14 0701 - 11/15 0700 In: 2001.8 [P.O.:240; I.V.:1261.8] Out: 580 [Urine:580] Intake/Output this shift: No intake/output data recorded.  Physical Exam: Patient is awake, A/O X 4, and conversant. He is in NAD and VSS. His affect is flat. Speech is fluent and appropriate. LEU 0/5, RLE 1/5. BLE 0/5. Sensation decreased in his BLE and BUE. PERLA, EOMI. CNs grossly intact. Dressing is clean dry intact. Incision is well approximated with no drainage, erythema, or edema.  Lab Results: Recent Labs    02/17/21 0559 02/17/21 1354 02/17/21 2349 02/18/21 0857  WBC 10.5  --   --   --   HGB 8.8*   < > 8.5* 8.2*  HCT 27.7*   < > 25.0* 24.0*  PLT 192  --   --   --    < > = values in this interval not displayed.   BMET Recent Labs    02/18/21 0958 02/19/21 0606  NA 138 134*  K 4.4 4.0  CL 105 100  CO2 17* 22  GLUCOSE 81 110*  BUN 52* 48*  CREATININE 3.71* 3.13*  CALCIUM 7.7* 7.9*    Studies/Results: DG CHEST PORT 1 VIEW  Result Date: 02/17/2021 CLINICAL DATA:  Volume overload EXAM: PORTABLE CHEST 1 VIEW COMPARISON:  February 16, 2020 FINDINGS: The cardiomediastinal silhouette is unchanged in contour. Blunting of LEFT costophrenic angle most consistent with small LEFT pleural effusion. No pneumothorax. LEFT retrocardiac opacity, likely atelectasis. Visualized abdomen is unremarkable. Status post posterior fixation of the cervical spine. IMPRESSION: Small LEFT pleural effusion with LEFT  basilar homogeneous opacity, likely atelectasis. Electronically Signed   By: Valentino Saxon M.D.   On: 02/17/2021 15:39    Assessment/Plan: Patient with C5 fracture with significant cervical stenosis and spinal cord injury from fall. Patient is post op s/p posterior cervical decompression, laminectomy C4-7 with posterior instrumentation C3-T1 on 02/14/2021. The patient reports a general feeling of being unwell. He has appropriate incisional pain that is exacerbated with movement. His neurological examination remains unchanged and severely impaired due to his spinal cord injury. Palliative medicine consulted yesterday. PT/OT recommending SNF. The patient is amenable to SNF placement and stated he is ready to leave the hospital ASAP.     -Maintain normotensive pressure -Pain control -[place] -Continue cervical collar -SLP -PT/OT -Awaiting Palliative Medicine consult   LOS: 5 days     Marvis Moeller, DNP, NP-C 02/19/2021, 8:11 AM

## 2021-02-19 NOTE — TOC Initial Note (Addendum)
Transition of Care [facility]) - Initial/Assessment Note    Patient Details  Name: Mitchell Hunter MRN: [id number] Date of Birth: 04/14/1945  Transition of Care [facility]) CM/SW Contact:    Ella Bodo, RN Phone Number: 02/19/2021, 1205  Clinical Narrative:                 75 y/o male presented to ED on 11/10 after falling backwards and hitting head. CT C-spine showed traumatic recent fx in body of C5 vertebra and retrolisthesis at C5-6 level with advanced biforaminal impingement at same level. S/p posterior cervical decompression C4-C7 and fusion C3-T1 on 11/10.   PTA, pt independent and living at home with spouse; recent falls reported by patient. Patient is desiring comfort measures and to return home with Hospice Care. Wife states her mother is being followed by [employer], and she would like to use this agency.  Referral to [facility] with [place]. Patient will need hospital bed and home oxygen.  Wife given list of private duty agencies, as she plans to hire caregivers to supplement Hospice care. [facility] to follow to arrange needed DME. Plan dc home when arrangements in place.   Expected Discharge Plan: Home w Hospice Care Barriers to Discharge: Continued Medical Work up   Patient Goals and CMS Choice Patient states their goals for this hospitalization and ongoing recovery are:: to go home CMS Medicare.gov Compare Post Acute Care list provided to:: Patient Represenative (must comment) (wife) Choice offered to / list presented to : Spouse  Expected Discharge Plan and Services Expected Discharge Plan: Home w Hospice Care   Discharge Planning Services: CM Consult Post Acute Care Choice: Hospice Living arrangements for the past 2 months: Single Family Home                 DME Arranged: Hospital bed, Oxygen       Representative spoke with at DME Agency: Arranged by [facility] HH Arranged: RN, Social Work [employer] Agency: Hospice and [place] (Now  Manufacturing engineer ([place])) Date [place]: 02/19/21 Time [place]: 1205 Representative spoke with at [place]: Domenic Moras  Prior Living Arrangements/Services Living arrangements for the past 2 months: [place] Lives with:: Spouse Patient language and need for interpreter reviewed:: Yes Do you feel safe going back to the place where you live?: Yes      Need for Family Participation in Patient Care: Yes (Comment) Care giver support system in place?: Yes (comment)   Criminal Activity/Legal Involvement Pertinent to Current Situation/Hospitalization: No - Comment as needed  Activities of Daily Living Home Assistive Devices/Equipment: Cane (specify quad or straight) ADL Screening (condition at time of admission) Patient's cognitive ability adequate to safely complete daily activities?: No Is the patient deaf or have difficulty hearing?: No Does the patient have difficulty seeing, even when wearing glasses/contacts?: No Does the patient have difficulty concentrating, remembering, or making decisions?: No Patient able to express need for assistance with ADLs?: Yes Does the patient have difficulty dressing or bathing?: Yes Independently performs ADLs?: Yes (appropriate for developmental age) Does the patient have difficulty walking or climbing stairs?: Yes Weakness of Legs: Both Weakness of Arms/Hands: Both                 Emotional Assessment Appearance:: Appears stated age   Affect (typically observed): Accepting Orientation: : Oriented to Self, Oriented to Place, Oriented to Situation      Admission diagnosis:  Quadriplegia ([place]) [G82.50] Spinal cord  compression ([place]) [G95.20] Fall (on)(from) incline, initial encounter [W10.2XXA] Closed displaced fracture of fifth cervical vertebra, unspecified fracture morphology, initial encounter ([place]) [S12.400A] Traumatic disp spondylolisthesis of C5 vertebra with closed fracture [facility])  [S12.430A] Patient Active Problem List   Diagnosis Date Noted   Spinal cord compression ([place])    Acute respiratory failure with hypoxia ([place])    Traumatic disp spondylolisthesis of C5 vertebra with closed fracture ([place]) 02/14/2021   Renal hemorrhage, left 01/11/2019   Pressure injury of skin 07/02/2018   Acute on chronic renal failure (HCC) 07/01/2018   HNP (herniated nucleus pulposus), lumbar 05/04/2013   PCP:  Christain [place], MD Pharmacy:   [id number] [place], [place] - [address] [place] [address] Phone: [phone] Fax: [phone]     Social Determinants of Health (SDOH) Interventions    Readmission Risk Interventions No flowsheet data found.  Reinaldo Raddle, RN, BSN  Trauma/Neuro ICU Case Manager [phone]

## 2021-02-19 NOTE — Consult Note (Signed)
Consultation Note Date: 02/19/2021   Patient Name: Mitchell Hunter  DOB: 1945-09-10  MRN: [id number]  Age / Sex: 75 y.o., male  PCP: Mitchell Sacramento, MD Referring Physician: Karsten Ro, DO  Reason for Consultation:   HPI/Patient Profile: 75 y.o. male  with past medical history of CKD, COPD, Parkinson's disease, HTN, DM admitted on 02/14/2021 with a fall resulting in vertebral fracture. Had surgical decompression resulting in upper extremity weakness, unable to use hands, bilateral lower extremity L side paralysis and extreme weakness on R. Bed bound. Totally dependent for care. Recovery has been complicated by worsening kidney function, hypotension, and respiratory failure with hypoventilation requiring Bipap intermittently. He is not eating or drinking. Palliative medicine consulted for [place].    Primary Decision Maker PATIENT  Discussion: Evaluated patient. Met first with his wife separately at their request. Then returned to bedside for [place] discussion with patient.  Patient has always been active and a "hard worker". He worked on the [employer] until retirement. He enjoys being outside, gardening.  Has a large family, they all live together on the family's property in [place] except for one son who lives near [place].  Patient is clear that he does not wish to pursue further therapies or other life prolonging procedures. His wishes are to attempt to return home and to remain there comfortably until the end of life. He does not want further bipap, labs, etc.  We discussed Hospice support at home vs hospice house.  They are agreeable.  Apolonio Schneiders is understandably concerned about her ability to care for patient.She is going to look into hiring private duty care in addition to what support Hospice can provide.      SUMMARY OF RECOMMENDATIONS -Transition to full comfort measures only -D/C interventions  not contributing to comfort -No further bipap, labs, IV fluids -Comfort medications as ordered- Don complains of pain, but notes it isn't pain, rather an agitation from not being able to move- we discussed using lorazepam to help ease that agitation/anxiety    Code Status/Advance Care Planning: DNR   Prognosis:   < 2 weeks likely due to decreased po intake and high risk of respiratory decompensation without further plans for bipap or interventions  Discharge Planning: Home with Hospice  Primary Diagnoses: Present on Admission:  Traumatic disp spondylolisthesis of C5 vertebra with closed fracture (HCC)     Vital Signs: BP (!) 161/64   Pulse 65   Temp 98.6 F (37 C)   Resp 19   Ht 6' (1.829 m)   Wt 82.6 kg   SpO2 97%   BMI 24.68 kg/m  Pain Scale: 0-10   Pain Score: 4    SpO2: SpO2: 97 % O2 Device:SpO2: 97 % O2 Flow Rate: .O2 Flow Rate (L/min): 2 L/min  IO: Intake/output summary:  Intake/Output Summary (Last 24 hours) at 02/19/2021 1107 Last data filed at 02/19/2021 0600 Gross per 24 hour  Intake 1506.21 ml  Output 580 ml  Net 926.21 ml    LBM: Last BM Date:  (  PTA) Baseline Weight: Weight: 83 kg Most recent weight: Weight: 82.6 kg     Palliative Assessment/Data: PPS: 20%       Thank you for this consult. Palliative medicine will continue to follow and assist as needed.   Time In: 1005 Time Out: 1115 Time Total: 70 minutes Greater than 50%  of this time was spent counseling and coordinating care related to the above assessment and plan.  Signed by: Mariana Hunter, Mitchell Palliative Medicine    Please contact Palliative Medicine Team phone at [phone] for questions and concerns.  For individual provider: See Shea Evans

## 2021-02-19 NOTE — Progress Notes (Addendum)
[facility]) Hospital Liaison Note  Received request from Transitions of Care Manager(Julie) for hospice services at home after discharge. Chart and patient information under review by [facility] physician. Hospice eligibility approved.     Met with Apolonio Schneiders to initiate education related to hospice philosophy, services, and team approach to care. Family verbalized understanding of information given. Per discussion, the plan is for patient to discharge home via [facility] on 02/20/2021.    DME needs discussed. Patient needs the following: Hospital bed and oxygen. Address verified and is correct in the chart. Apolonio Schneiders is the family member to contact to arrange time of equipment delivery.    Please send signed and completed DNR home with patient/family. Please provide prescriptions at discharge as needed to ensure ongoing symptom management.    [facility] information and contact numbers given to wife Apolonio Schneiders. Above information shared with Almyra Free, TOC.   Thank you.  Zollie Pee [facility] Liaison  [phone]

## 2021-02-20 MED ORDER — GABAPENTIN 300 MG PO CAPS: 300.0000 mg | ORAL_CAPSULE | Freq: Two times a day (BID) | ORAL | Status: DC

## 2021-02-20 NOTE — Progress Notes (Signed)
Subjective: Patient is resting comfortably in his bed. No family at bedside. No acute events overnight.   Objective: Vital signs in last 24 hours: Temp:  [98.6 F (37 C)-99.1 F (37.3 C)] 98.6 F (37 C) (11/16 0500) Pulse Rate:  [65-72] 66 (11/16 0500) Resp:  [8-22] 8 (11/16 0500) BP: (160-171)/(63-72) 171/72 (11/15 1400) SpO2:  [96 %-99 %] 96 % (11/16 0500)  Intake/Output from previous day: 11/15 0701 - 11/16 0700 In: -  Out: 1700 [Urine:1700] Intake/Output this shift: No intake/output data recorded.  Physical Exam: Patient is resting comfortably. He will arouse to verbal stimulation. He is in NAD. Speech is fluent and appropriate. Dressing is clean dry intact. Incision is well approximated with no drainage, erythema, or edema.  Lab Results: Recent Labs    02/17/21 2349 02/18/21 0857  HGB 8.5* 8.2*  HCT 25.0* 24.0*   BMET Recent Labs    02/18/21 0958 02/19/21 0606  NA 138 134*  K 4.4 4.0  CL 105 100  CO2 17* 22  GLUCOSE 81 110*  BUN 52* 48*  CREATININE 3.71* 3.13*  CALCIUM 7.7* 7.9*    Studies/Results: No results found.  Assessment/Plan: Patient with C5 fracture with significant cervical stenosis and spinal cord injury from fall. Patient is post op s/p posterior cervical decompression, laminectomy C4-7 with posterior instrumentation C3-T1 on 02/14/2021. The patient continues to have a diffuse feeling of being unwell throughout his body. He has appropriate incisional pain. His neurological examination remains unchanged and severely impaired due to the spinal cord injury.   The patient and family met with palliative medicine yesterday and have decided to move forward with full comfort care. He plans to be discharged home today with Hospice care. I feel that this is an acceptable choice due to his current status and comorbidities. I appreciate the palliative teams help with managing this patient.    -Pain control -[place]     LOS: 6 days     Marvis Moeller, DNP, NP-C 02/20/2021, 8:13 AM

## 2021-02-20 NOTE — Progress Notes (Signed)
After reviewing the patient's chart, epic notes, labs, and imaging.  I assessed the patient at bedside.  His son and daughter were at bedside.  They had no concerns.  They verbalized that all of their DME needs are met at home and their mother is awaiting the patient's transport there now.  I advised them to utilize the hospice 24/7 phone service to assist them with any and all medical concerns once the patient is home.    Patient was sleeping and in no acute distress.  However, I would recommend an additional dose of Dilaudid prior to transport to maximize patient's comfort as he is moved.  Palliative medicine will continue to follow patient throughout his hospitalization.  However, patient is scheduled to be transported home with home hospice meeting with them at 6:30 PM tonight.  [place] Ilsa Iha, FNP-BC Palliative Medicine Team Team Phone # [phone]   NO CHARGE

## 2021-02-20 NOTE — Progress Notes (Signed)
Decision to transition to hospice with full comfort care noted.  Nothing further to add and will sign off.  Please call with questions or concerns.

## 2021-02-20 NOTE — Progress Notes (Signed)
[facility]) Hospital Liaison Note  DME has been delivered. Admission visit is scheduled for today at 6:30pm.   Please send signed and completed DNR home with patient/family. Please provide prescriptions at discharge as needed to ensure ongoing symptom management.   Thank you.  [facility] Liaison  [phone]

## 2021-02-20 NOTE — Discharge Summary (Signed)
Physician Discharge Summary  Patient ID: CAUY MELODY MRN: [id number] DOB/AGE: [birth year] 75 y.o.  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  He was brought to the emergency department as a code stroke due to his weakness.  CT cervical spine revealed a C5 anterior vertebral body fracture with retrolisthesis and severe stenosis that leads causing spinal cord compression.  He had significant weakness from his deltoid biceps down with minimal sensation.  It was decided to urgently take him to the operating room where he underwent posterior cervical decompression, laminectomy C4-7 with posterior instrumentation C3-T1 on 02/14/2021.  The patient was transferred to the neurological intensive care unit for continued monitoring.  Throughout the patient's admission, his symptoms fail to improve and had significant disability due to his spinal cord compression.  His hospital course was complicated with acute on chronic renal failure and acute respiratory failure with hypoxia.  Due to the patient's status in the hospital and lack of recovery, palliative Medicine was consulted  and was ultimately decided by a pilot the medicine and the patient's family and the patient to send the patient home with hospice Medicine.  He was placed on comfort care on 02/19/2021.  His home equipment was delivered to his house and he is ready to be discharged home with hospice.   Consults: pulmonary/intensive care, nephrology, and palliative med  Significant Diagnostic Studies: radiology: X-Ray: chest, cervical, MRI: cervical spine, and CT scan: head, cervical, thoracic, chest abdomen pelvis  Treatments: surgery:  Posterior cervical instrumentation and fusion C3-4, C4-5, C5-6, C6-7, C7-T1; K2 M [place] 3.5 x 14 mm bilaterally at C3, C4, C5, C6, 4.5 x 26 mm pedicle instrumentation bilaterally at T1 Posterior cervical bilateral laminectomy and decompression C4, C5, C6, C7 for decompression of neural elements Intraoperative use of microscope for microdissection Intraoperative use of autograft, same incision Intraoperative use of allograft, vesuvius Intraoperative use of fluoroscopy, greater than 1 hour  Discharge Exam: Blood pressure (!) 171/72, pulse 65, temperature 98.4 F (36.9 C), resp. rate 18, height 6' (1.829 m), weight 82.6 kg, SpO2 97 %.   Physical Exam: Patient is awake, A/O X 4, and conversant. He is in NAD and VSS. His affect is flat. Speech is fluent and appropriate. LEU 0/5, RLE 1/5. BLE 0/5. Sensation decreased in his BLE and BUE. PERLA, EOMI. CNs grossly intact. Dressing is clean dry intact. Incision is well approximated with no drainage, erythema, or edema.  Disposition: Discharge disposition: 01-Home or Self Care       Discharge Instructions     Incentive spirometry RT   Complete by: As directed       Allergies as of 02/20/2021       Reactions   Adhesive [tape] Other (See Comments)   SKIN IS VERY  THIN AND WILL TEAR EASILY- WILL NEED EITHER A TAPE ALTERNATIVE OR PAPER TAPE        Medication List     TAKE these medications    acetaminophen 650 MG CR  tablet Commonly known as: TYLENOL Take 650-1,300 mg by mouth 3 (three) times daily as needed for pain.   allopurinol 100 MG tablet Commonly known as: ZYLOPRIM Take 100 mg by mouth 2 (two) times daily.   ALPRAZolam 0.25 MG tablet Commonly known as: XANAX Take 0.25-0.5 mg by mouth 2 (two) times daily as needed for sleep (or tremors).   amLODipine 10 MG tablet Commonly known as: NORVASC Take 1 tablet (10 mg total) by mouth daily.   aspirin EC 81 MG tablet Take 81 mg by mouth every other day.   benazepril 20 MG tablet Commonly known as: LOTENSIN Take 20 mg by mouth daily.   carbidopa-levodopa 25-100 MG tablet Commonly known as: SINEMET IR Take 2 tablets by mouth 3 (three) times daily before meals. What changed:  how much to take when to take this additional instructions   cetirizine 10 MG tablet Commonly known as: ZYRTEC Take 10 mg by mouth daily.   dapagliflozin propanediol 10 MG Tabs tablet Commonly known as: FARXIGA Take 1 tablet by mouth daily.   escitalopram 10 MG tablet Commonly known as: LEXAPRO Take 10 mg by mouth daily.   Fish Oil 1000 MG Caps Take 1,000 mg by mouth 2 (two) times daily.   folic acid 1 MG tablet Commonly known as: FOLVITE Take 1 tablet (1 mg total) by mouth daily.   gabapentin 600 MG tablet Commonly known as: NEURONTIN Take 600 mg by mouth in the morning and at bedtime.   HYDROcodone-acetaminophen 10-325 MG tablet Commonly known as: NORCO Take 1 tablet by mouth daily as needed (for pain).   multivitamin capsule Take 1 capsule by mouth daily.   omeprazole 20 MG capsule Commonly known as: PRILOSEC Take 20 mg by mouth daily before breakfast.   PRESERVISION AREDS 2 PO Take 1 capsule by mouth 2 (two) times daily.   promethazine 12.5 MG tablet Commonly known as: PHENERGAN Take 12.5 mg by mouth every 8 (eight) hours as needed for nausea or vomiting.   thiamine 100 MG tablet Take 1 tablet (100 mg total) by mouth daily.    tiZANidine 4 MG capsule Commonly known as: ZANAFLEX Take 4 mg by mouth 3 (three) times daily as needed for muscle spasms.   triamcinolone cream 0.1 % Commonly known as: KENALOG Apply 1 application topically as needed (for rashes or irritation).   Vitamin D3 1.25 MG (50000 UT) Caps Take 50,000 Units by mouth every Sunday.         Signed: Marvis Moeller, DNP, NP-C 02/20/2021, 1:11 PM

## 2021-02-20 NOTE — Progress Notes (Signed)
Palliative:  I was called to sign outpatient DNR form as patient is planned to go home with hospice today. Reviewed palliative notes with plan for DNR and comfort care. RN reports no symptom burden and no needs. Outpatient DNR signed and placed on shadow chart for return home under hospice care.   No charge  Vinie Sill, NP Palliative Medicine Team Pager [phone] (Please see [email or web address] for schedule) Team Phone [phone]

## 2021-02-20 NOTE — TOC Transition Note (Signed)
Transition of Care [facility]) - CM/SW Discharge Note   Patient Details  Name: Mitchell Hunter MRN: [id number] Date of Birth: June 26, 1945  [facility]) CM/SW Contact:  Benard Halsted, LCSW Phone Number: 02/20/2021, 1:40 PM   Clinical Narrative:    Patient will DC to: Home Anticipated DC date: 02/20/21 Family notified: Spouse Transport by: Corey Harold   Per MD patient ready for DC to home. RN, hospice, patient, patient's family, and facility notified of DC. DC packet on chart. Ambulance transport requested for patient.   CSW will sign off for now as social work intervention is no longer needed. Please consult [place] again if new needs arise.     Final next level of care: Home w Hospice Care Barriers to Discharge: Barriers Resolved   Patient Goals and CMS Choice Patient states their goals for this hospitalization and ongoing recovery are:: to go home CMS Medicare.gov Compare Post Acute Care list provided to:: Patient Represenative (must comment) (wife) Choice offered to / list presented to : Spouse  Discharge Placement                Patient to be transferred to facility by: [place] Name of family member notified: Spouse Patient and family notified of of transfer: 02/20/21  Discharge Plan and Services   Discharge Planning Services: CM Consult Post Acute Care Choice: Hospice          DME Arranged: Hospital bed, Oxygen       Representative spoke with at DME Agency: Arranged by [facility] HH Arranged: RN, Social Work [employer] Agency: Hospice and [place] (Now Manufacturing engineer ([place])) Date [place]: 02/19/21 Time [place]: 1205 Representative spoke with at [place]: [place] ([place]) Interventions     Readmission Risk Interventions No flowsheet data found.

## 2021-02-20 NOTE — TOC Progression Note (Signed)
Transition of Care [facility]) - Progression Note    Patient Details  Name: Mitchell Hunter MRN: [id number] Date of Birth: [birth year]  Transition of Care [facility]) CM/SW Contact  [place] Cleta Alberts, RN Phone Number: 02/20/2021, 12:52 PM  Clinical Narrative:    Equipment has been delivered to home, per patient's family at bedside.  They state they would like to get patient home today, if possible. Notified Shanita,CSW with [facility]; nursing admission visit planned for 6:30pm, per CSW. Out of facility DNR has been signed by PMT NP; appreciate assistance.  Have left messages with attending and his NP, requesting DC order/summary.  Nurse to follow for orders.  Please notify unit CSW when dc order is obtained, so that [facility]AR transport can be arranged.     Expected Discharge Plan: Home w Hospice Care Barriers to Discharge: Continued Medical Work up  Expected Discharge Plan and Services Expected Discharge Plan: [place]   Discharge Planning Services: CM Consult Post Acute Care Choice: Hospice Living arrangements for the past 2 months: Single Family Home                 DME Arranged: Hospital bed, Oxygen       Representative spoke with at DME Agency: Arranged by [facility] HH Arranged: RN, Social Work [employer] Agency: Hospice and [place] (Now Manufacturing engineer ([place])) Date [place]: 02/19/21 Time [place]: 1205 Representative spoke with at [place]: [place] ([place]) Interventions    Readmission Risk Interventions No flowsheet data found.  Reinaldo Raddle, RN, BSN  Trauma/Neuro ICU Case Manager [phone]

## 2021-03-07 DEATH — deceased

## 2021-04-10 ENCOUNTER — Ambulatory Visit: Payer: Medicare Other | Admitting: Diagnostic Neuroimaging

## 2021-07-30 ENCOUNTER — Ambulatory Visit: Payer: Medicare Other | Admitting: Diagnostic Neuroimaging
